# Patient Record
Sex: Female | Born: 1980 | Hispanic: Refuse to answer | Marital: Single | Smoking: Never smoker
Health system: Southern US, Community
[De-identification: ages and names within clinical notes are randomized; demographics above are authoritative.]

## PROBLEM LIST (undated history)

## (undated) DIAGNOSIS — Z8669 Personal history of other diseases of the nervous system and sense organs: Secondary | ICD-10-CM

## (undated) DIAGNOSIS — J45909 Unspecified asthma, uncomplicated: Secondary | ICD-10-CM

## (undated) DIAGNOSIS — D259 Leiomyoma of uterus, unspecified: Secondary | ICD-10-CM

## (undated) DIAGNOSIS — R112 Nausea with vomiting, unspecified: Secondary | ICD-10-CM

## (undated) DIAGNOSIS — R519 Headache, unspecified: Secondary | ICD-10-CM

## (undated) DIAGNOSIS — N840 Polyp of corpus uteri: Secondary | ICD-10-CM

## (undated) DIAGNOSIS — Z923 Personal history of irradiation: Secondary | ICD-10-CM

## (undated) DIAGNOSIS — D649 Anemia, unspecified: Secondary | ICD-10-CM

## (undated) DIAGNOSIS — Z803 Family history of malignant neoplasm of breast: Secondary | ICD-10-CM

## (undated) DIAGNOSIS — C801 Malignant (primary) neoplasm, unspecified: Secondary | ICD-10-CM

## (undated) DIAGNOSIS — D219 Benign neoplasm of connective and other soft tissue, unspecified: Secondary | ICD-10-CM

## (undated) DIAGNOSIS — J189 Pneumonia, unspecified organism: Secondary | ICD-10-CM

## (undated) DIAGNOSIS — Z9889 Other specified postprocedural states: Secondary | ICD-10-CM

## (undated) HISTORY — DX: Leiomyoma of uterus, unspecified: D25.9

## (undated) HISTORY — DX: Family history of malignant neoplasm of breast: Z80.3

## (undated) HISTORY — DX: Personal history of other diseases of the nervous system and sense organs: Z86.69

## (undated) HISTORY — DX: Unspecified asthma, uncomplicated: J45.909

---
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
## 2003-05-21 HISTORY — PX: UTERINE FIBROID SURGERY: SHX826

## 2015-03-24 DIAGNOSIS — E559 Vitamin D deficiency, unspecified: Secondary | ICD-10-CM | POA: Insufficient documentation

## 2015-07-19 DIAGNOSIS — J45909 Unspecified asthma, uncomplicated: Secondary | ICD-10-CM | POA: Insufficient documentation

## 2015-08-10 LAB — HM PAP SMEAR

## 2015-08-18 ENCOUNTER — Other Ambulatory Visit: Payer: Self-pay | Admitting: Medical

## 2015-08-18 ENCOUNTER — Ambulatory Visit
Admission: RE | Admit: 2015-08-18 | Discharge: 2015-08-18 | Disposition: A | Discharge status: Home / Self Care | Payer: BLUE CROSS/BLUE SHIELD | Source: Ambulatory Visit | Attending: Medical | Admitting: Medical

## 2015-08-18 DIAGNOSIS — R05 Cough: Secondary | ICD-10-CM

## 2015-08-18 DIAGNOSIS — R059 Cough, unspecified: Secondary | ICD-10-CM

## 2015-08-29 DIAGNOSIS — J40 Bronchitis, not specified as acute or chronic: Secondary | ICD-10-CM | POA: Insufficient documentation

## 2016-06-24 DIAGNOSIS — J329 Chronic sinusitis, unspecified: Secondary | ICD-10-CM | POA: Insufficient documentation

## 2016-06-24 DIAGNOSIS — H6980 Other specified disorders of Eustachian tube, unspecified ear: Secondary | ICD-10-CM | POA: Insufficient documentation

## 2016-09-02 ENCOUNTER — Ambulatory Visit (INDEPENDENT_AMBULATORY_CARE_PROVIDER_SITE_OTHER): Payer: BLUE CROSS/BLUE SHIELD | Admitting: Advanced Practice Midwife

## 2016-09-02 ENCOUNTER — Encounter: Payer: Self-pay | Admitting: Advanced Practice Midwife

## 2016-09-02 VITALS — BP 120/80 | HR 87 | Ht 69.0 in | Wt 158.0 lb

## 2016-09-02 DIAGNOSIS — Z01419 Encounter for gynecological examination (general) (routine) without abnormal findings: Secondary | ICD-10-CM

## 2016-09-02 DIAGNOSIS — Z30019 Encounter for initial prescription of contraceptives, unspecified: Secondary | ICD-10-CM | POA: Diagnosis not present

## 2016-09-02 DIAGNOSIS — Z124 Encounter for screening for malignant neoplasm of cervix: Secondary | ICD-10-CM | POA: Diagnosis not present

## 2016-09-02 MED ORDER — ETONOGESTREL-ETHINYL ESTRADIOL 0.12-0.015 MG/24HR VA RING: VAGINAL_RING | VAGINAL | 11 refills | Status: DC

## 2016-09-02 NOTE — Progress Notes (Signed)
Gynecology Annual Exam  PCP: No PCP Per Patient  Chief Complaint:  Chief Complaint  Patient presents with  . Gynecologic Exam    History of Present Illness: Patient is a 36 y.o. G0P0000 presents for annual exam. The patient has no complaints today.   LMP: Patient's last menstrual period was 08/22/2016. Average Interval: regular, 28 days Duration of flow: 5 days Heavy Menses: no Clots: no Intermenstrual Bleeding: no Postcoital Bleeding: no Dysmenorrhea: no   The patient is sexually active. She currently uses Nuvaring for contraception. She denies dyspareunia.  The patient does not perform self breast exams.  There is no notable family history of breast or ovarian cancer in her family.  The patient wears seatbelts: yes.   The patient has regular exercise: occasional walking. She regulary rides her bicycle when she is home in [place] for summers. She admits to generally eating a healthy diet.  The patient denies current symptoms of depression.    Review of Systems: Review of Systems  Constitutional: Negative.   HENT: Negative.   Eyes: Negative.   Respiratory: Negative.   Cardiovascular: Negative.   Gastrointestinal: Negative.   Genitourinary: Negative.   Musculoskeletal: Negative.   Skin: Negative.   Neurological: Negative.   Endo/Heme/Allergies: Negative.   Psychiatric/Behavioral: Negative.     Past Medical History:  Past Medical History:  Diagnosis Date  . Asthma   . Uterine myoma     Past Surgical History:  No past surgical history on file.  Gynecologic History:  Patient's last menstrual period was 08/22/2016. Contraception: NuvaRing vaginal inserts Last Pap: Results were: no abnormalities   Obstetric History: G0P0000  Family History:  Family History  Problem Relation Age of Onset  . Endometriosis Mother   . Hypertension Mother   . Skin cancer Father   . Hypertension Father   . Crohn's disease Father   . Breast cancer Paternal Grandmother      Social History:  Social History   Social History  . Marital status: Single    Spouse name: N/A  . Number of children: N/A  . Years of education: N/A   Occupational History  . Not on file.   Social History Main Topics  . Smoking status: Never Smoker  . Smokeless tobacco: Never Used  . Alcohol use No  . Drug use: No  . Sexual activity: No   Other Topics Concern  . Not on file   Social History Narrative  . No narrative on file    Allergies:  Allergies  Allergen Reactions  . Aspirin Shortness Of Breath  . Iodine Rash    Medications: Prior to Admission medications   Medication Sig Start Date End Date Taking? Authorizing Provider  albuterol (PROVENTIL HFA;VENTOLIN HFA) 108 (90 Base) MCG/ACT inhaler Inhale into the lungs.    Historical Provider, MD  ibuprofen (ADVIL,MOTRIN) 400 MG tablet Take by mouth.    Historical Provider, MD  loratadine (CLARITIN) 10 MG tablet Take by mouth.    Historical Provider, MD    Physical Exam Vitals: Blood pressure 120/80, pulse 87, height 5\' 9"  (1.753 m), weight 158 lb (71.7 kg), last menstrual period 08/22/2016.  General: NAD HEENT: normocephalic, anicteric Thyroid: no enlargement, no palpable nodules Pulmonary: No increased work of breathing, CTAB Cardiovascular: RRR, distal pulses 2+ Breast: Breast symmetrical, no tenderness, no palpable nodules or masses, no skin or nipple retraction present, no nipple discharge.  No axillary or supraclavicular lymphadenopathy. Abdomen: NABS, soft, non-tender, non-distended.  Umbilicus without lesions.  No  hepatomegaly, splenomegaly or masses palpable. No evidence of hernia  Genitourinary:  External: Normal external female genitalia.  Normal urethral meatus, normal  Bartholin's and Skene's glands.    Vagina: Normal vaginal mucosa, no evidence of prolapse.    Cervix: Grossly normal in appearance, no bleeding  Uterus: Non-enlarged, mobile, normal contour.  No CMT  Adnexa: ovaries non-enlarged, no  adnexal masses  Rectal: deferred  Lymphatic: no evidence of inguinal lymphadenopathy Extremities: no edema, erythema, or tenderness Neurologic: Grossly intact Psychiatric: mood appropriate, affect full  Assessment: 36 y.o. G0P0000 Well woman exam with PAP smear   Plan: Problem List Items Addressed This Visit    None      1) STI screening was done as part of PAP smear  2) ASCCP guidelines and rational discussed.  Patient opts for yearly screening interval  3) Contraception - Pt chooses to continue on Nuvaring for best control of her uterine fibroids. She has annual u/s f/u per her gyn in [place].  4) Recommend increase in healthy lifestyle exercise  5) Follow up 1 year for routine annual exam   Rod Can, CNM  Patient ID: Madison Sullivan, female   DOB: May 04, 1981, 36 y.o.   MRN: [id number]

## 2016-09-03 LAB — IGP,RFX APTIMA HPV ALL PTH: PAP SMEAR COMMENT: 0

## 2016-10-01 ENCOUNTER — Telehealth: Payer: Self-pay | Admitting: Advanced Practice Midwife

## 2016-10-01 NOTE — Telephone Encounter (Signed)
Patient called and stated that she went to her pharm and she usually get 3 months of nuvarings at the time, however they said at the pharm that Opal Sidles put in for 1 at the time.  She is traveling this summer and is in need of the 3 months at the time.  Please advise patient.   CB# [phone]

## 2016-10-01 NOTE — Telephone Encounter (Signed)
Caller states RX was changed and she wants the 3 month Pack called in. It isn't urgent. Nuvoring -in the past it has been a 3 pack. She will be traveling. She doesn't want 11 refills but would like it changed to a 3 pack so she doesn't have to get refills. Pharmacy (used to be [facility]

## 2016-10-01 NOTE — Telephone Encounter (Signed)
Contacted [facility] and updated rx to 3 months supply due to 1 year of refills. Left msg for pt that we have updated rx.

## 2016-10-02 NOTE — Telephone Encounter (Signed)
Left message for patient to call back if there are still problems with her Rx for Nuvaring

## 2017-01-21 ENCOUNTER — Encounter: Payer: Self-pay | Admitting: Medical

## 2017-01-21 ENCOUNTER — Ambulatory Visit: Payer: BLUE CROSS/BLUE SHIELD | Admitting: Medical

## 2017-01-21 VITALS — BP 110/80 | HR 87 | Temp 99.6°F | Resp 18 | Ht 69.0 in | Wt 151.0 lb

## 2017-01-21 DIAGNOSIS — M722 Plantar fascial fibromatosis: Secondary | ICD-10-CM

## 2017-01-21 DIAGNOSIS — Z0000 Encounter for general adult medical examination without abnormal findings: Secondary | ICD-10-CM

## 2017-01-21 DIAGNOSIS — K625 Hemorrhage of anus and rectum: Secondary | ICD-10-CM

## 2017-01-21 NOTE — Patient Instructions (Addendum)
Rectal Bleeding Rectal bleeding is when blood comes out of the opening of the butt (anus). People with this kind of bleeding may notice bright red blood in their underwear or in the toilet after they poop (have a bowel movement). They may also have dark red or black poop (stool). Rectal bleeding is often a sign that something is wrong. It needs to be checked by a doctor. Follow these instructions at home: Watch for any changes in your condition. Take these actions to help with bleeding and discomfort:  Eat a diet that is high in fiber. This will keep your poop soft so it is easier for you to poop without pushing too hard. Ask your doctor to tell you what foods and drinks are high in fiber.  Drink enough fluid to keep your pee (urine) clear or pale yellow. This also helps keep your poop soft.  Try taking a warm bath. This may help with pain.  Keep all follow-up visits as told by your doctor. This is important.  Get help right away if:  You have new bleeding.  You have more bleeding than before.  You have black or dark red poop.  You throw up (vomit) blood or something that looks like coffee grounds.  You have pain or tenderness in your belly (abdomen).  You have a fever.  You feel weak.  You feel sick to your stomach (nauseous).  You pass out (faint).  You have very bad pain in your butt.  You cannot poop. This information is not intended to replace advice given to you by your health care provider. Make sure you discuss any questions you have with your health care provider. Document Released: 01/16/2011 Document Revised: 10/12/2015 Document Reviewed: 07/02/2015 Elsevier Interactive Patient Education  [employer].        2007, [employer] Doc.30Plantar Fasciitis Plantar fasciitis is a painful foot condition that affects the heel. It occurs when the band of tissue that connects the toes to the heel bone (plantar fascia) becomes irritated. This can  happen after exercising too much or doing other repetitive activities (overuse injury). The pain from plantar fasciitis can range from mild irritation to severe pain that makes it difficult for you to walk or move. The pain is usually worse in the morning or after you have been sitting or lying down for a while. What are the causes? This condition may be caused by:  Standing for long periods of time.  Wearing shoes that do not fit.  Doing high-impact activities, including running, aerobics, and ballet.  Being overweight.  Having an abnormal way of walking (gait).  Having tight calf muscles.  Having high arches in your feet.  Starting a new athletic activity.  What are the signs or symptoms? The main symptom of this condition is heel pain. Other symptoms include:  Pain that gets worse after activity or exercise.  Pain that is worse in the morning or after resting.  Pain that goes away after you walk for a few minutes.  How is this diagnosed? This condition may be diagnosed based on your signs and symptoms. Your health care provider will also do a physical exam to check for:  A tender area on the bottom of your foot.  A high arch in your foot.  Pain when you move your foot.  Difficulty moving your foot.  You may also need to have imaging studies to confirm the diagnosis. These can include:  X-rays.  Ultrasound.  MRI.  How  is this treated? Treatment for plantar fasciitis depends on the severity of the condition. Your treatment may include:  Rest, ice, and over-the-counter pain medicines to manage your pain.  Exercises to stretch your calves and your plantar fascia.  A splint that holds your foot in a stretched, upward position while you sleep (night splint).  Physical therapy to relieve symptoms and prevent problems in the future.  Cortisone injections to relieve severe pain.  Extracorporeal shock wave therapy (ESWT) to stimulate damaged plantar fascia with  electrical impulses. It is often used as a last resort before surgery.  Surgery, if other treatments have not worked after 12 months.  Follow these instructions at home:  Take medicines only as directed by your health care provider.  Avoid activities that cause pain.  Roll the bottom of your foot over a bag of ice or a bottle of cold water. Do this for 20 minutes, 3-4 times a day.  Perform simple stretches as directed by your health care provider.  Try wearing athletic shoes with air-sole or gel-sole cushions or soft shoe inserts.  Wear a night splint while sleeping, if directed by your health care provider.  Keep all follow-up appointments with your health care provider. How is this prevented?  Do not perform exercises or activities that cause heel pain.  Consider finding low-impact activities if you continue to have problems.  Lose weight if you need to. The best way to prevent plantar fasciitis is to avoid the activities that aggravate your plantar fascia. Contact a health care provider if:  Your symptoms do not go away after treatment with home care measures.  Your pain gets worse.  Your pain affects your ability to move or do your daily activities. This information is not intended to replace advice given to you by your health care provider. Make sure you discuss any questions you have with your health care provider. Document Released: 01/29/2001 Document Revised: 10/09/2015 Document Reviewed: 03/16/2014 Elsevier Interactive Patient Education  [employer].

## 2017-01-21 NOTE — Progress Notes (Signed)
   Subjective:    Patient ID: Madison Sullivan, female    DOB: 1980/05/26, 36 y.o.   MRN: [id number]  HPI 36 yo comes in today. Left foot pain at arch of foot, starting more in the spring, Notices it sometimes in the morning and sometimes after resting. No numbness or tingling. Has iced it and used supportive socks , and arch supportive shoes which helped. Seem to get worse over the summer after walking a lot in [place]. Rates pain today as 1-2/10. If she walks all day 8/10. Located at the arch of foot.  Thinks she may have hemmorrhoids after hard stools, some soreness after hard stools. Starting in the spring. After returning from [place] decided to get it checked out.  Constipation on occasion( maybe once a week).Has had some bleeding after hard stool. Bright red. Just noticed on wiping, none in the toilet. None Blood in stool.   Father with Crohns disease. Avoids chili spice/ spicy foods/ and garlic( father has allergy to garlic past history of bowel surgery). Last episode was last week.  Would like to  Set up for primary care appointment. Last year had the pneumonia 36 and flu vaccine wondering about next pneumonia vaccine.   Review of Systems  Constitutional: Negative for chills and fever.  HENT: Positive for congestion and sinus pressure. Negative for ear pain, rhinorrhea and sore throat.   Eyes: Positive for itching.  Respiratory: Negative for cough and shortness of breath.   Cardiovascular: Negative for chest pain.  Gastrointestinal: Positive for anal bleeding, constipation and rectal pain. Negative for abdominal pain, blood in stool, diarrhea, nausea and vomiting.  Endocrine: Negative for polydipsia, polyphagia and polyuria.  Genitourinary: Negative for dysuria.  Musculoskeletal: Positive for neck pain. Negative for back pain.  Skin: Negative for rash.  Allergic/Immunologic: Positive for environmental allergies and food allergies.  Neurological: Positive for headaches. Negative for  dizziness, syncope and light-headedness.  Hematological: Negative for adenopathy.  Psychiatric/Behavioral: Negative for agitation, behavioral problems and suicidal ideas. The patient is not nervous/anxious.      neck pain with tension.  Possibly food allergies see note above. Objective:   Physical Exam  Constitutional: She is oriented to person, place, and time. She appears well-developed and well-nourished.  HENT:  Head: Normocephalic and atraumatic.  Eyes: Pupils are equal, round, and reactive to light. Conjunctivae and EOM are normal.  Cardiovascular: Normal rate, regular rhythm and normal heart sounds.   Pulmonary/Chest: Effort normal and breath sounds normal.  Musculoskeletal: Normal range of motion.  Neurological: She is alert and oriented to person, place, and time.  Skin: Skin is warm and dry.  Psychiatric: She has a normal mood and affect. Her behavior is normal. Judgment and thought content normal.  Nursing note and vitals reviewed.     guiac negative. No hemorrhoids on exam external, none palpated internally. nontender on rectal exam.    Assessment & Plan:  Plantar fascitis left foot. .ice , supportive shoes.will hold off on Ibuprofen due to rectal bleeding. If worsening will refer to podiatry. Constipation , wiping with blood present, rectal bleeding. Will refer to for evaluation GI, ? If she had a small fissure I see nothing on exam today. Ordered blood work for primary care exam. CBC/ Met C Thyroid panel, lipid panel ,Vit D. Return to the clinic for primary exam after blood work is complete.

## 2017-01-29 DIAGNOSIS — E559 Vitamin D deficiency, unspecified: Secondary | ICD-10-CM

## 2017-01-29 DIAGNOSIS — H6980 Other specified disorders of Eustachian tube, unspecified ear: Secondary | ICD-10-CM

## 2017-01-29 DIAGNOSIS — J329 Chronic sinusitis, unspecified: Secondary | ICD-10-CM

## 2017-01-29 DIAGNOSIS — J45909 Unspecified asthma, uncomplicated: Secondary | ICD-10-CM

## 2017-01-29 DIAGNOSIS — J40 Bronchitis, not specified as acute or chronic: Secondary | ICD-10-CM

## 2017-02-04 ENCOUNTER — Other Ambulatory Visit: Payer: BLUE CROSS/BLUE SHIELD

## 2017-02-04 DIAGNOSIS — Z0000 Encounter for general adult medical examination without abnormal findings: Secondary | ICD-10-CM

## 2017-02-04 LAB — POCT URINALYSIS DIPSTICK
BILIRUBIN UA: NEGATIVE
Blood, UA: NEGATIVE
GLUCOSE UA: NEGATIVE
KETONES UA: NEGATIVE
LEUKOCYTES UA: NEGATIVE
Nitrite, UA: NEGATIVE
PH UA: 6.5 (ref 5.0–8.0)
Spec Grav, UA: 1.015 (ref 1.010–1.025)
Urobilinogen, UA: 0.2 E.U./dL

## 2017-02-05 LAB — CMP12+LP+TP+TSH+6AC+CBC/D/PLT
ALBUMIN: 4.6 g/dL (ref 3.5–5.5)
ALK PHOS: 85 IU/L (ref 39–117)
ALT: 15 IU/L (ref 0–32)
AST: 16 IU/L (ref 0–40)
Albumin/Globulin Ratio: 2.2 (ref 1.2–2.2)
BASOS: 1 %
BUN/Creatinine Ratio: 8 — ABNORMAL LOW (ref 9–23)
BUN: 7 mg/dL (ref 6–20)
Basophils Absolute: 0 10*3/uL (ref 0.0–0.2)
Bilirubin Total: 0.5 mg/dL (ref 0.0–1.2)
CALCIUM: 8.9 mg/dL (ref 8.7–10.2)
CHLORIDE: 105 mmol/L (ref 96–106)
CHOL/HDL RATIO: 2.8 ratio (ref 0.0–4.4)
CHOLESTEROL TOTAL: 193 mg/dL (ref 100–199)
Creatinine, Ser: 0.93 mg/dL (ref 0.57–1.00)
EOS (ABSOLUTE): 0.1 10*3/uL (ref 0.0–0.4)
Eos: 1 %
Free Thyroxine Index: 1.6 (ref 1.2–4.9)
GFR calc Af Amer: 91 mL/min/{1.73_m2} (ref >59)
GFR, EST NON AFRICAN AMERICAN: 79 mL/min/{1.73_m2} (ref >59)
GGT: 12 IU/L (ref 0–60)
GLOBULIN, TOTAL: 2.1 g/dL (ref 1.5–4.5)
Glucose: 90 mg/dL (ref 65–99)
HDL: 68 mg/dL (ref >39)
HEMATOCRIT: 43.7 % (ref 34.0–46.6)
Hemoglobin: 14.8 g/dL (ref 11.1–15.9)
Immature Grans (Abs): 0 10*3/uL (ref 0.0–0.1)
Immature Granulocytes: 0 %
Iron: 149 ug/dL (ref 27–159)
LDH: 154 IU/L (ref 119–226)
LDL CALC: 93 mg/dL (ref 0–99)
LYMPHS ABS: 2.9 10*3/uL (ref 0.7–3.1)
Lymphs: 45 %
MCH: 30.6 pg (ref 26.6–33.0)
MCHC: 33.9 g/dL (ref 31.5–35.7)
MCV: 91 fL (ref 79–97)
MONOS ABS: 0.5 10*3/uL (ref 0.1–0.9)
Monocytes: 9 %
NEUTROS ABS: 2.8 10*3/uL (ref 1.4–7.0)
Neutrophils: 44 %
PHOSPHORUS: 2.9 mg/dL (ref 2.5–4.5)
Platelets: 268 10*3/uL (ref 150–379)
Potassium: 4.3 mmol/L (ref 3.5–5.2)
RBC: 4.83 x10E6/uL (ref 3.77–5.28)
RDW: 13.1 % (ref 12.3–15.4)
SODIUM: 142 mmol/L (ref 134–144)
T3 Uptake Ratio: 19 % — ABNORMAL LOW (ref 24–39)
T4 TOTAL: 8.5 ug/dL (ref 4.5–12.0)
TSH: 2.5 u[IU]/mL (ref 0.450–4.500)
Total Protein: 6.7 g/dL (ref 6.0–8.5)
Triglycerides: 161 mg/dL — ABNORMAL HIGH (ref 0–149)
Uric Acid: 4.2 mg/dL (ref 2.5–7.1)
VLDL Cholesterol Cal: 32 mg/dL (ref 5–40)
WBC: 6.3 10*3/uL (ref 3.4–10.8)

## 2017-02-05 LAB — VITAMIN D 25 HYDROXY (VIT D DEFICIENCY, FRACTURES): Vit D, 25-Hydroxy: 27.7 ng/mL — ABNORMAL LOW (ref 30.0–100.0)

## 2017-02-18 ENCOUNTER — Encounter: Payer: Self-pay | Admitting: Medical

## 2017-02-18 ENCOUNTER — Ambulatory Visit: Payer: BLUE CROSS/BLUE SHIELD | Admitting: Medical

## 2017-02-18 VITALS — BP 116/80 | HR 94 | Temp 99.2°F | Resp 16 | Wt 149.0 lb

## 2017-02-18 DIAGNOSIS — J302 Other seasonal allergic rhinitis: Secondary | ICD-10-CM

## 2017-02-18 DIAGNOSIS — E559 Vitamin D deficiency, unspecified: Secondary | ICD-10-CM

## 2017-02-18 DIAGNOSIS — J4520 Mild intermittent asthma, uncomplicated: Secondary | ICD-10-CM

## 2017-02-18 DIAGNOSIS — Z23 Encounter for immunization: Secondary | ICD-10-CM

## 2017-02-18 DIAGNOSIS — Z0000 Encounter for general adult medical examination without abnormal findings: Secondary | ICD-10-CM

## 2017-02-18 NOTE — Patient Instructions (Addendum)
Take Vitamin D3 2000IU/ day for  3 months then have a recheck. Calll when desiring a referral to podiatry.    Breast Self-Awareness Breast self-awareness means:  Knowing how your breasts look.  Knowing how your breasts feel.  Checking your breasts every month for changes.  Telling your doctor if you notice a change in your breasts.  Breast self-awareness allows you to notice a breast problem early while it is still small. How to do a breast self-exam One way to learn what is normal for your breasts and to check for changes is to do a breast self-exam. To do a breast self-exam: Look for Changes  1. Take off all the clothes above your waist. 2. Stand in front of a mirror in a room with good lighting. 3. Put your hands on your hips. 4. Push your hands down. 5. Look at your breasts and nipples in the mirror to see if one breast or nipple looks different than the other. Check to see if: ? The shape of one breast is different. ? The size of one breast is different. ? There are wrinkles, dips, and bumps in one breast and not the other. 6. Look at each breast for changes in your skin, such as: ? Redness. ? Scaly areas. 7. Look for changes in your nipples, such as: ? Liquid around the nipples. ? Bleeding. ? Dimpling. ? Redness. ? A change in where the nipples are. Feel for Changes 1. Lie on your back on the floor. 2. Feel each breast. To do this, follow these steps: ? Pick a breast to feel. ? Put the arm closest to that breast above your head. ? Use your other arm to feel the nipple area of your breast. Feel the area with the pads of your three middle fingers by making small circles with your fingers. For the first circle, press lightly. For the second circle, press harder. For the third circle, press even harder. ? Keep making circles with your fingers at the light, harder, and even harder pressures as you move down your breast. Stop when you feel your ribs. ? Move your fingers a  little toward the center of your body. ? Start making circles with your fingers again, this time going up until you reach your collarbone. ? Keep making up and down circles until you reach your armpit. Remember to keep using the three pressures. ? Feel the other breast in the same way. 3. Sit or stand in the shower or tub. 4. With soapy water on your skin, feel each breast the same way you did in step 2, when you were lying on the floor. Write Down What You Find  After doing the self-exam, write down:  What is normal for each breast.  Any changes you find in each breast.  When you last had your period.  How often should I check my breasts? Check your breasts every month. If you are breastfeeding, the best time to check them is after you feed your baby or after you use a breast pump. If you get periods, the best time to check your breasts is 5-7 days after your period is over. When should I see my doctor? See your doctor if you notice:  A change in shape or size of your breasts or nipples.  A change in the skin of your breast or nipples, such as red or scaly skin.  Unusual fluid coming from your nipples.  A lump or thick area that was not  there before.  Pain in your breasts.  Anything that concerns you.  This information is not intended to replace advice given to you by your health care provider. Make sure you discuss any questions you have with your health care provider. Document Released: 10/23/2007 Document Revised: 10/12/2015 Document Reviewed: 03/26/2015 Elsevier Interactive Patient Education  2018 [employer].  Vitamin D Deficiency Vitamin D deficiency is when your body does not have enough vitamin D. Vitamin D is important because:  It helps your body use other minerals that your body needs.  It helps keep your bones strong and healthy.  It may help to prevent some diseases.  It helps your heart and other muscles work well.  You can get vitamin D by:  Eating  foods with vitamin D in them.  Drinking or eating milk or other foods that have had vitamin D added to them.  Taking a vitamin D supplement.  Being in the sun.  Not getting enough vitamin D can make your bones become soft. It can also cause other health problems. Follow these instructions at home:  Take medicines and supplements only as told by your doctor.  Eat foods that have vitamin D. These include: ? Dairy products, cereals, or juices with added vitamin D. Check the label for vitamin D. ? Fatty fish like salmon or trout. ? Eggs. ? Oysters.  Do not use tanning beds.  Stay at a healthy weight. Lose weight, if needed.  Keep all follow-up visits as told by your doctor. This is important. Contact a doctor if:  Your symptoms do not go away.  You feel sick to your stomach (nauseous).  Youthrow up (vomit).  You poop less often than usual or you have trouble pooping (constipation). This information is not intended to replace advice given to you by your health care provider. Make sure you discuss any questions you have with your health care provider. Document Released: 04/25/2011 Document Revised: 10/12/2015 Document Reviewed: 09/21/2014 Elsevier Interactive Patient Education  2018 [employer]-healthy meal planning includes:  Limiting unhealthy fats.  Increasing healthy fats.  Making other small dietary changes.  You may need to talk with your doctor or a diet specialist (dietitian) to create an eating plan that is right for you. What types of fat should I choose?  Choose healthy fats. These include olive oil and canola oil, flaxseeds, walnuts, almonds, and seeds.  Eat more omega-3 fats. These include salmon, mackerel, sardines, tuna, flaxseed oil, and ground flaxseeds. Try to eat fish at least twice each week.  Limit saturated fats. ? Saturated fats are often found in animal products, such as meats, butter, and cream. ? Plant  sources of saturated fats include palm oil, palm kernel oil, and coconut oil.  Avoid foods with partially hydrogenated oils in them. These include stick margarine, some tub margarines, cookies, crackers, and other baked goods. These contain trans fats. What general guidelines do I need to follow?  Check food labels carefully. Identify foods with trans fats or high amounts of saturated fat.  Fill one half of your plate with vegetables and green salads. Eat 4-5 servings of vegetables per day. A serving of vegetables is: ? 1 cup of raw leafy vegetables. ?  cup of raw or cooked cut-up vegetables. ?  cup of vegetable juice.  Fill one fourth of your plate with whole grains. Look for the word "whole" as the first word in the ingredient list.  Fill one fourth of your plate  with lean protein foods.  Eat 4-5 servings of fruit per day. A serving of fruit is: ? One medium whole fruit. ?  cup of dried fruit. ?  cup of fresh, frozen, or canned fruit. ?  cup of 100% fruit juice.  Eat more foods that contain soluble fiber. These include apples, broccoli, carrots, beans, peas, and barley. Try to get 20-30 g of fiber per day.  Eat more home-cooked food. Eat less restaurant, buffet, and fast food.  Limit or avoid alcohol.  Limit foods high in starch and sugar.  Avoid fried foods.  Avoid frying your food. Try baking, boiling, grilling, or broiling it instead. You can also reduce fat by: ? Removing the skin from poultry. ? Removing all visible fats from meats. ? Skimming the fat off of stews, soups, and gravies before serving them. ? Steaming vegetables in water or broth.  Lose weight if you are overweight.  Eat 4-5 servings of nuts, legumes, and seeds per week: ? One serving of dried beans or legumes equals  cup after being cooked. ? One serving of nuts equals 1 ounces. ? One serving of seeds equals  ounce or one tablespoon.  You may need to keep track of how much salt or sodium you  eat. This is especially true if you have high blood pressure. Talk with your doctor or dietitian to get more information. What foods can I eat? Grains Breads, including [place], white, pita, wheat, raisin, rye, oatmeal, and [place]. Tortillas that are neither fried nor made with lard or trans fat. Low-fat rolls, including hotdog and hamburger buns and English muffins. Biscuits. Muffins. Waffles. Pancakes. Light popcorn. Whole-grain cereals. Flatbread. Melba toast. Pretzels. Breadsticks. Rusks. Low-fat snacks. Low-fat crackers, including oyster, saltine, matzo, graham, animal, and rye. Rice and pasta, including brown rice and pastas that are made with whole wheat. Vegetables All vegetables. Fruits All fruits, but limit coconut. Meats and Other Protein Sources Lean, well-trimmed beef, veal, pork, and lamb. Chicken and [place] without skin. All fish and shellfish. Wild duck, rabbit, pheasant, and venison. Egg whites or low-cholesterol egg substitutes. Dried beans, peas, lentils, and tofu. Seeds and most nuts. Dairy Low-fat or nonfat cheeses, including ricotta, string, and mozzarella. Skim or 1% milk that is liquid, powdered, or evaporated. Buttermilk that is made with low-fat milk. Nonfat or low-fat yogurt. Beverages Mineral water. Diet carbonated beverages. Sweets and Desserts Sherbets and fruit ices. Honey, jam, marmalade, jelly, and syrups. Meringues and gelatins. Pure sugar candy, such as hard candy, jelly beans, gumdrops, mints, marshmallows, and small amounts of dark chocolate. [employer] Eat all sweets and desserts in moderation. Fats and Oils Nonhydrogenated (trans-free) margarines. Vegetable oils, including soybean, sesame, sunflower, olive, peanut, safflower, corn, canola, and cottonseed. Salad dressings or mayonnaise made with a vegetable oil. Limit added fats and oils that you use for cooking, baking, salads, and as spreads. Other Cocoa powder. Coffee and tea. All seasonings and  condiments. The items listed above may not be a complete list of recommended foods or beverages. Contact your dietitian for more options. What foods are not recommended? Grains Breads that are made with saturated or trans fats, oils, or whole milk. Croissants. Butter rolls. Cheese breads. Sweet rolls. Donuts. Buttered popcorn. Chow mein noodles. High-fat crackers, such as cheese or butter crackers. Meats and Other Protein Sources Fatty meats, such as hotdogs, short ribs, sausage, spareribs, bacon, rib eye roast or steak, and mutton. High-fat deli meats, such as salami and bologna. Caviar. Domestic duck and goose.  Organ meats, such as kidney, liver, sweetbreads, and heart. Dairy Cream, sour cream, cream cheese, and creamed cottage cheese. Whole-milk cheeses, including blue (bleu), Monte[place] Jack, [place], [place], [place], [place], Swiss, cheddar, [place], and [place]. Whole or 2% milk that is liquid, evaporated, or condensed. Whole buttermilk. Cream sauce or high-fat cheese sauce. Yogurt that is made from whole milk. Beverages Regular sodas and juice drinks with added sugar. Sweets and Desserts Frosting. Pudding. Cookies. Cakes other than angel food cake. Candy that has milk chocolate or white chocolate, hydrogenated fat, butter, coconut, or unknown ingredients. Buttered syrups. Full-fat ice cream or ice cream drinks. Fats and Oils Gravy that has suet, meat fat, or shortening. Cocoa butter, hydrogenated oils, palm oil, coconut oil, palm kernel oil. These can often be found in baked products, candy, fried foods, nondairy creamers, and whipped toppings. Solid fats and shortenings, including bacon fat, salt pork, lard, and butter. Nondairy cream substitutes, such as coffee creamers and sour cream substitutes. Salad dressings that are made of unknown oils, cheese, or sour cream. The items listed above may not be a complete list of foods and beverages to avoid. Contact your dietitian for more  information. This information is not intended to replace advice given to you by your health care provider. Make sure you discuss any questions you have with your health care provider. Document Released: 11/05/2011 Document Revised: 10/12/2015 Document Reviewed: 10/28/2013 Elsevier Interactive Patient Education  [employer].

## 2017-02-18 NOTE — Progress Notes (Signed)
Subjective:    Patient ID: Madison Sullivan, female    DOB: 09-23-1980, 36 y.o.   MRN: [id number]  HPI Here for primary care exam,  Works for [employer] 3  Years , teaches history. Single lives alone,  Seeing GI next week, Bleeding still coming and going. Occurs at the end of a bowel movement. No cramping. ( feels when the stool is harder, but denies constipation) Feels like the anus expands and blood appears on the toilet paper. No blood in the toilet or miixed in stool though may be at the end of the stool.  History of fractured patella tx with stablilization  1990.  Does not exercise regularly, Eats what she likes.   [place]  and get headaches, stomach nausea, got out of heat and the heigtht and felt better. Took Ibuprofen , took a nap and fell better.  itchy ears with nasal congestion. Review of Systems  Constitutional: Negative.   HENT: Positive for nosebleeds (with dry air and congestion).   Eyes: Positive for itching (claritin helps).  Respiratory: Negative.   Cardiovascular: Negative.   Gastrointestinal: Positive for anal bleeding.  Endocrine: Negative.   Genitourinary: Negative.   Musculoskeletal: Positive for gait problem (left foot pain,  waiting for GI eval before trying NSIDS , wears supportive shoes with good result.) and neck stiffness (upper back pain. typing and carrying backpack.).  Skin: Negative.   Allergic/Immunologic: Positive for environmental allergies.  Neurological: Positive for headaches (Headache at times, potential migraines.).  Hematological: Bruises/bleeds easily.  Psychiatric/Behavioral: Negative.  Negative for behavioral problems.       Objective:   Physical Exam  Constitutional: She is oriented to person, place, and time. She appears well-developed and well-nourished.  HENT:  Head: Normocephalic and atraumatic.  Right Ear: Hearing, tympanic membrane, external ear and ear canal normal.  Left Ear: Hearing,  tympanic membrane, external ear and ear canal normal.  Nose: Nose normal.  Mouth/Throat: Uvula is midline, oropharynx is clear and moist and mucous membranes are normal.  Eyes: Pupils are equal, round, and reactive to light. Conjunctivae and EOM are normal. Lids are everted and swept, no foreign bodies found.  Fundoscopic exam:      The right eye shows red reflex.       The left eye shows red reflex.  Neck: Trachea normal and normal range of motion. Neck supple. No thyromegaly present.  Cardiovascular: Normal rate, regular rhythm, normal heart sounds and intact distal pulses.  Exam reveals no gallop and no friction rub.   No murmur heard. Pulmonary/Chest: Effort normal and breath sounds normal.  Abdominal: Soft. Normal appearance, normal aorta and bowel sounds are normal.  Musculoskeletal: Normal range of motion.  Lymphadenopathy:    She has no cervical adenopathy.  Neurological: She is alert and oriented to person, place, and time. She has normal strength. No cranial nerve deficit or sensory deficit. She displays a negative Romberg sign. GCS eye subscore is 4. GCS verbal subscore is 5. GCS motor subscore is 6.  Reflex Scores:      Brachioradialis reflexes are 2+ on the right side and 2+ on the left side.      Patellar reflexes are 2+ on the right side and 2+ on the left side.      Achilles reflexes are 2+ on the right side and 2+ on the left side. Skin:  Mid upper back flaking, uses a shampoo x 3 days from [place] and a gel,  clears it up for  6 months then she needs to retreat. Not sure of the name in [place]. Looks fungal , scaling mid upper back.  Psychiatric: She has a normal mood and affect. Her behavior is normal. Judgment and thought content normal.  Nursing note and vitals reviewed.     Breast exam by OB/GYN last visit in April 2018. Rectal exam done on  01/21/17. Hemocult negative.      Assessment & Plan:   Primary care exam Reviewed labs, Vitamin D deficiency to take   Vitamin D3 2000 IU/day and recheck in  3 months.   Asthma, and seasonal allergies well controlled with current medications.  Flu Vaccine given today. Reviewed with patent she does not need Pneumonia  23 at this time.She has gotten the Pneumonia 13 vaccine ( documented in [place]). Reviewed exercise and diet with patient in getting triglycerides lower.  Healthy diet information and self breast exams educational material given to patient. Return to the clinic as needed. She verbalizes understanding and has no questions at this time.

## 2017-04-01 ENCOUNTER — Ambulatory Visit: Payer: BLUE CROSS/BLUE SHIELD | Admitting: Medical

## 2017-04-01 ENCOUNTER — Encounter: Payer: Self-pay | Admitting: Medical

## 2017-04-01 VITALS — BP 126/86 | HR 79 | Temp 99.1°F | Resp 16 | Wt 156.0 lb

## 2017-04-01 DIAGNOSIS — J069 Acute upper respiratory infection, unspecified: Secondary | ICD-10-CM

## 2017-04-01 DIAGNOSIS — J0100 Acute maxillary sinusitis, unspecified: Secondary | ICD-10-CM

## 2017-04-01 MED ORDER — AMOXICILLIN-POT CLAVULANATE 875-125 MG PO TABS: 1.0000 | ORAL_TABLET | Freq: Two times a day (BID) | ORAL | 0 refills | Status: DC

## 2017-04-01 MED ORDER — BENZONATATE 100 MG PO CAPS: 100.0000 mg | ORAL_CAPSULE | Freq: Three times a day (TID) | ORAL | 0 refills | Status: DC

## 2017-04-01 NOTE — Progress Notes (Signed)
   Subjective:    Patient ID: Madison Sullivan, female    DOB: 21-Jul-1980, 36 y.o.   MRN: [id number]  HPI 36 yo female in non acute distress complaints of nasal congestion, ears full , cough productive green and headache.Talking brings on coughing. Possibly with fever and chills last night. Feeling fatigued from all the coughing. Feeling short of breath with coughing. Continues to work. Brought her Albuterol inhaler to work but has not used it yet.   Review of Systems  Constitutional: Positive for fatigue. Negative for chills (possibly) and fever (possibly).  HENT: Positive for congestion, postnasal drip, sinus pressure, sinus pain and sore throat. Negative for ear pain, hearing loss and trouble swallowing.   Eyes: Positive for itching. Negative for discharge.  Respiratory: Positive for cough, chest tightness, shortness of breath and wheezing.   Cardiovascular: Negative for chest pain, palpitations and leg swelling.  Gastrointestinal: Negative for abdominal pain.  Endocrine: Negative for cold intolerance and heat intolerance.  Genitourinary: Negative for dysuria.  Musculoskeletal: Positive for myalgias.  Skin: Negative for rash.  Allergic/Immunologic: Positive for environmental allergies. Negative for food allergies.  Neurological: Positive for headaches. Negative for dizziness, syncope and light-headedness.  Hematological: Negative for adenopathy.  Psychiatric/Behavioral: Negative for behavioral problems, self-injury and suicidal ideas. The patient is not nervous/anxious.        Objective:   Physical Exam  Constitutional: She is oriented to person, place, and time. She appears well-developed and well-nourished.  HENT:  Head: Normocephalic and atraumatic.  Right Ear: Hearing, external ear and ear canal normal. A middle ear effusion is present.  Left Ear: Hearing, external ear and ear canal normal. A middle ear effusion is present.  Nose: Mucosal edema and rhinorrhea present.   Mouth/Throat: Uvula is midline, oropharynx is clear and moist and mucous membranes are normal.  Eyes: Conjunctivae and EOM are normal. Pupils are equal, round, and reactive to light.  Neck: Normal range of motion. Neck supple.  Cardiovascular: Normal rate, regular rhythm, normal heart sounds and intact distal pulses. Exam reveals no gallop and no friction rub.  No murmur heard. Pulmonary/Chest: Effort normal and breath sounds normal. No respiratory distress. She has no wheezes. She has no rales.  Lymphadenopathy:    She has cervical adenopathy.  Neurological: She is alert and oriented to person, place, and time.  Skin: Skin is warm and dry.  Psychiatric: She has a normal mood and affect. Her behavior is normal. Judgment and thought content normal.  Nursing note and vitals reviewed.     Cough noted in room. Cough while trying to talk in long sentences.     Assessment & Plan:  Sinusitis/ Upper Respiratory Infection/ Cough Meds ordered this encounter  Medications  . amoxicillin-clavulanate (AUGMENTIN) 875-125 MG tablet    Sig: Take 1 tablet 2 (two) times daily by mouth.    Dispense:  20 tablet    Refill:  0  . benzonatate (TESSALON) 100 MG capsule    Sig: Take 1 capsule (100 mg total) 3 (three) times daily by mouth. As needed for cough    Dispense:  30 capsule    Refill:  0  Return in 3-5 days if not improving. Rest and increase fluids. Recommmended OTC Claritin to help dry up post nasal drip and Mucinex ( plain) to help loosen up mucus in lungs. Use Albuterol MDI as directed.  Rest and increase fluids. Patient verbalizes understanding and has no questions at discharge.

## 2017-04-01 NOTE — Patient Instructions (Signed)
Cough, Adult A cough helps to clear your throat and lungs. A cough may last only 2-3 weeks (acute), or it may last longer than 8 weeks (chronic). Many different things can cause a cough. A cough may be a sign of an illness or another medical condition. Follow these instructions at home:  Pay attention to any changes in your cough.  Take medicines only as told by your doctor. ? If you were prescribed an antibiotic medicine, take it as told by your doctor. Do not stop taking it even if you start to feel better. ? Talk with your doctor before you try using a cough medicine.  Drink enough fluid to keep your pee (urine) clear or pale yellow.  If the air is dry, use a cold steam vaporizer or humidifier in your home.  Stay away from things that make you cough at work or at home.  If your cough is worse at night, try using extra pillows to raise your head up higher while you sleep.  Do not smoke, and try not to be around smoke. If you need help quitting, ask your doctor.  Do not have caffeine.  Do not drink alcohol.  Rest as needed. Contact a doctor if:  You have new problems (symptoms).  You cough up yellow fluid (pus).  Your cough does not get better after 2-3 weeks, or your cough gets worse.  Medicine does not help your cough and you are not sleeping well.  You have pain that gets worse or pain that is not helped with medicine.  You have a fever.  You are losing weight and you do not know why.  You have night sweats. Get help right away if:  You cough up blood.  You have trouble breathing.  Your heartbeat is very fast. This information is not intended to replace advice given to you by your health care provider. Make sure you discuss any questions you have with your health care provider. Document Released: 01/17/2011 Document Revised: 10/12/2015 Document Reviewed: 07/13/2014 Elsevier Interactive Patient Education  2018 [employer] Upper Respiratory Infection,  Adult Most upper respiratory infections (URIs) are caused by a virus. A URI affects the nose, throat, and upper air passages. The most common type of URI is often called "the common cold." Follow these instructions at home:  Take medicines only as told by your doctor.  Gargle warm saltwater or take cough drops to comfort your throat as told by your doctor.  Use a warm mist humidifier or inhale steam from a shower to increase air moisture. This may make it easier to breathe.  Drink enough fluid to keep your pee (urine) clear or pale yellow.  Eat soups and other clear broths.  Have a healthy diet.  Rest as needed.  Go back to work when your fever is gone or your doctor says it is okay. ? You may need to stay home longer to avoid giving your URI to others. ? You can also wear a face mask and wash your hands often to prevent spread of the virus.  Use your inhaler more if you have asthma.  Do not use any tobacco products, including cigarettes, chewing tobacco, or electronic cigarettes. If you need help quitting, ask your doctor. Contact a doctor if:  You are getting worse, not better.  Your symptoms are not helped by medicine.  You have chills.  You are getting more short of breath.  You have brown or red mucus.  You have yellow or   brown discharge from your nose.  You have pain in your face, especially when you bend forward.  You have a fever.  You have puffy (swollen) neck glands.  You have pain while swallowing.  You have white areas in the back of your throat. Get help right away if:  You have very bad or constant: ? Headache. ? Ear pain. ? Pain in your forehead, behind your eyes, and over your cheekbones (sinus pain). ? Chest pain.  You have long-lasting (chronic) lung disease and any of the following: ? Wheezing. ? Long-lasting cough. ? Coughing up blood. ? A change in your usual mucus.  You have a stiff neck.  You have changes in  your: ? Vision. ? Hearing. ? Thinking. ? Mood. This information is not intended to replace advice given to you by your health care provider. Make sure you discuss any questions you have with your health care provider. Document Released: 10/23/2007 Document Revised: 01/07/2016 Document Reviewed: 08/11/2013 Elsevier Interactive Patient Education  2018 [employer]. Sinusitis, Adult Sinusitis is soreness and inflammation of your sinuses. Sinuses are hollow spaces in the bones around your face. They are located:  Around your eyes.  In the middle of your forehead.  Behind your nose.  In your cheekbones.  Your sinuses and nasal passages are lined with a stringy fluid (mucus). Mucus normally drains out of your sinuses. When your nasal tissues get inflamed or swollen, the mucus can get trapped or blocked so air cannot flow through your sinuses. This lets bacteria, viruses, and funguses grow, and that leads to infection. Follow these instructions at home: Medicines  Take, use, or apply over-the-counter and prescription medicines only as told by your doctor. These may include nasal sprays.  If you were prescribed an antibiotic medicine, take it as told by your doctor. Do not stop taking the antibiotic even if you start to feel better. Hydrate and Humidify  Drink enough water to keep your pee (urine) clear or pale yellow.  Use a cool mist humidifier to keep the humidity level in your home above 50%.  Breathe in steam for 10-15 minutes, 3-4 times a day or as told by your doctor. You can do this in the bathroom while a hot shower is running.  Try not to spend time in cool or dry air. Rest  Rest as much as possible.  Sleep with your head raised (elevated).  Make sure to get enough sleep each night. General instructions  Put a warm, moist washcloth on your face 3-4 times a day or as told by your doctor. This will help with discomfort.  Wash your hands often with soap and water. If  there is no soap and water, use hand sanitizer.  Do not smoke. Avoid being around people who are smoking (secondhand smoke).  Keep all follow-up visits as told by your doctor. This is important. Contact a doctor if:  You have a fever.  Your symptoms get worse.  Your symptoms do not get better within 10 days. Get help right away if:  You have a very bad headache.  You cannot stop throwing up (vomiting).  You have pain or swelling around your face or eyes.  You have trouble seeing.  You feel confused.  Your neck is stiff.  You have trouble breathing. This information is not intended to replace advice given to you by your health care provider. Make sure you discuss any questions you have with your health care provider. Document Released: 10/23/2007 Document Revised:  12/31/2015 Document Reviewed: 03/01/2015 Elsevier Interactive Patient Education  2018 [employer] Inc[employer]  

## 2017-09-08 ENCOUNTER — Ambulatory Visit (INDEPENDENT_AMBULATORY_CARE_PROVIDER_SITE_OTHER): Payer: BLUE CROSS/BLUE SHIELD | Admitting: Maternal Newborn

## 2017-09-08 ENCOUNTER — Encounter: Payer: Self-pay | Admitting: Maternal Newborn

## 2017-09-08 VITALS — BP 120/80 | HR 66 | Ht 69.0 in | Wt 156.0 lb

## 2017-09-08 DIAGNOSIS — Z01419 Encounter for gynecological examination (general) (routine) without abnormal findings: Secondary | ICD-10-CM

## 2017-09-08 NOTE — Progress Notes (Signed)
Gynecology Annual Exam  PCP: Patient, No Pcp Per  Chief Complaint:  Chief Complaint  Patient presents with  . Gynecologic Exam    History of Present Illness: Patient is a 37 y.o. G0P0000 presents for annual exam. The patient has seasonal allergies that are currently causing symptoms as detailed in ROS.  LMP: Patient's last menstrual period was 07/27/2017. Average Interval: regular, 28 days Duration of flow: 5 days Heavy Menses: no Clots: no Intermenstrual Bleeding: no Postcoital Bleeding: not applicable Dysmenorrhea: no  The patient is not currently sexually active. She currently uses NuvaRing vaginal inserts for cycle control/contraception.  The patient occasionally performs self breast exams.  There is notable family history of breast cancer in her paternal grandmother, she believes she was at an older age but uncertain about exact age of onset. There is no history of ovarian cancer in her family.  The patient wears seatbelts: yes.   The patient has regular exercise: no.    The patient denies current symptoms of depression.    Review of Systems  Constitutional: Negative.   HENT: Positive for congestion and sinus pain.   Eyes: Positive for redness.  Respiratory: Positive for cough. Negative for shortness of breath and wheezing.        Seasonal allergies, sneezing  Cardiovascular: Negative for chest pain and palpitations.  Gastrointestinal: Negative for abdominal pain, constipation, diarrhea, heartburn and nausea.  Genitourinary: Negative.   Musculoskeletal: Positive for joint pain.  Skin: Positive for itching. Negative for rash.  Neurological: Positive for headaches. Negative for sensory change.  Endo/Heme/Allergies: Negative.   Psychiatric/Behavioral: Negative for depression. The patient is not nervous/anxious.   All other systems reviewed and are negative.   Past Medical History:  Past Medical History:  Diagnosis Date  . Asthma   . History of migraine headaches     Abstract from medicat  . Uterine myoma     Past Surgical History:  Past Surgical History:  Procedure Laterality Date  . NASAL SINUS SURGERY Bilateral 1998   corrected nasal septum  . UTERINE FIBROID SURGERY  2005   in [place],horrible blood loss; not cancerous, "just a growth" some  being watched by OB/GYN.    Gynecologic History:  Patient's last menstrual period was 07/27/2017. Contraception: NuvaRing vaginal inserts Last Pap: 09/02/2016. Results were: NILM   Obstetric History: G0P0000  Family History:  Family History  Problem Relation Age of Onset  . Endometriosis Mother   . Hypertension Mother   . Depression Mother   . Migraines Mother   . Cancer Mother        brain tumors  . Skin cancer Father   . Hypertension Father   . Crohn's disease Father   . Asthma Father   . Breast cancer Paternal Grandmother   . Endometriosis Sister   . Depression Sister   . Asthma Brother   . Hip fracture Paternal Grandfather     Social History:  Social History   Socioeconomic History  . Marital status: Single    Spouse name: Not on file  . Number of children: Not on file  . Years of education: 92  . Highest education level: Not on file  Occupational History  . Occupation: professor  Social Needs  . Financial resource strain: Not on file  . Food insecurity:    Worry: Not on file    Inability: Not on file  . Transportation needs:    Medical: Not on file    Non-medical: Not on file  Tobacco  Use  . Smoking status: Never Smoker  . Smokeless tobacco: Never Used  Substance and Sexual Activity  . Alcohol use: No    Comment: less than once a week a drink  . Drug use: No  . Sexual activity: Not Currently    Birth control/protection: Inserts  Lifestyle  . Physical activity:    Days per week: Not on file    Minutes per session: Not on file  . Stress: Not on file  Relationships  . Social connections:    Talks on phone: Not on file    Gets together: Not on file     Attends religious service: Not on file    Active member of club or organization: Not on file    Attends meetings of clubs or organizations: Not on file    Relationship status: Not on file  . Intimate partner violence:    Fear of current or ex partner: Not on file    Emotionally abused: Not on file    Physically abused: Not on file    Forced sexual activity: Not on file  Other Topics Concern  . Not on file  Social History Narrative  . Not on file    Allergies:  Allergies  Allergen Reactions  . Aspirin Shortness Of Breath    Avoids it due to her asthma  . Iodine Rash    Medications: Prior to Admission medications   Medication Sig Start Date End Date Taking? Authorizing Provider  albuterol (PROVENTIL HFA;VENTOLIN HFA) 108 (90 Base) MCG/ACT inhaler Inhale into the lungs.    [provider]  amoxicillin-clavulanate (AUGMENTIN) 875-125 MG tablet Take 1 tablet 2 (two) times daily by mouth. 04/01/17   Ratcliffe, Heather R, PA-C  benzonatate (TESSALON) 100 MG capsule Take 1 capsule (100 mg total) 3 (three) times daily by mouth. As needed for cough 04/01/17   Ratcliffe, Estill Dooms, PA-C  etonogestrel-ethinyl estradiol (NUVARING) 0.12-0.015 MG/24HR vaginal ring Insert vaginally and leave in place for 3 consecutive weeks, then remove for 1 week. 09/02/16   Rod Can, CNM  ibuprofen (ADVIL,MOTRIN) 400 MG tablet Take by mouth.    [provider]  loratadine (CLARITIN) 10 MG tablet Take by mouth.    [provider]    Physical Exam Vitals: Blood pressure 120/80, pulse 66, height 5\' 9"  (1.753 m), weight 156 lb (70.8 kg), last menstrual period 07/27/2017.  General: NAD HEENT: normocephalic, anicteric Thyroid: no enlargement, no palpable nodules Pulmonary: No increased work of breathing, CTAB Cardiovascular: RRR, S1 and S2 auscultated Breast: Breasts symmetrical, no tenderness, no palpable nodules or masses, no skin or nipple retraction present, no nipple  discharge.  No axillary or supraclavicular lymphadenopathy. Abdomen: soft, non-tender, non-distended.  Umbilicus without lesions.  No hepatomegaly, splenomegaly or masses palpable. No evidence of hernia  Genitourinary:  External: Normal external female genitalia.  Normal urethral  meatus, normal Bartholin's and Skene's glands.    Vagina: Normal vaginal mucosa, no evidence of prolapse.    Cervix: Grossly normal in appearance, no bleeding  Uterus: Non-enlarged, mobile, normal contour.  No CMT  Adnexa: ovaries non-enlarged, no adnexal masses  Rectal: deferred  Lymphatic: no evidence of inguinal lymphadenopathy Extremities: no edema, erythema, or tenderness Neurologic: Grossly intact Psychiatric: mood appropriate, affect full  Assessment: 37 y.o. G0P0000 routine annual exam.  Plan: Problem List Items Addressed This Visit    None    Visit Diagnoses    Encounter for annual routine gynecological examination    -  Primary  1) STI screening was offered and declined.  2) ASCCP guidelines and rationale discussed.  Patient opts for every 3 year screening interval.  3) Contraception -She is using NuvaRing for cycle control/history of fibroid surgery and she wishes to continue on this method.  4) Routine healthcare maintenance including cholesterol, diabetes screening discussed: managed by provider in [place].  5) Follow up 1 year for routine annual exam. May need pelvic ultrasound next year to monitor fibroids if she does not see gynecologist in [place] this summer.  Avel Sensor, CNM 09/08/2017  9:13 AM

## 2017-10-17 ENCOUNTER — Telehealth: Payer: Self-pay

## 2017-10-17 ENCOUNTER — Other Ambulatory Visit: Payer: Self-pay | Admitting: Maternal Newborn

## 2017-10-17 DIAGNOSIS — Z30019 Encounter for initial prescription of contraceptives, unspecified: Secondary | ICD-10-CM

## 2017-10-17 MED ORDER — ETONOGESTREL-ETHINYL ESTRADIOL 0.12-0.015 MG/24HR VA RING: VAGINAL_RING | VAGINAL | 4 refills | Status: DC

## 2017-10-17 NOTE — Progress Notes (Signed)
Rx sent for 3 pack NuvaRing.

## 2017-10-17 NOTE — Telephone Encounter (Signed)
Pt requesting RX for nuvaring be changed to 3 month supply. fwding to [facility] for change of RX

## 2018-01-30 ENCOUNTER — Other Ambulatory Visit: Payer: Self-pay | Admitting: Medical

## 2018-01-30 DIAGNOSIS — Z0000 Encounter for general adult medical examination without abnormal findings: Secondary | ICD-10-CM

## 2018-01-30 NOTE — Progress Notes (Signed)
annual labs ordered for physical

## 2018-02-02 ENCOUNTER — Other Ambulatory Visit: Payer: Self-pay | Admitting: Medical

## 2018-02-02 DIAGNOSIS — Z114 Encounter for screening for human immunodeficiency virus [HIV]: Secondary | ICD-10-CM

## 2018-02-02 DIAGNOSIS — Z0189 Encounter for other specified special examinations: Secondary | ICD-10-CM

## 2018-02-02 DIAGNOSIS — E559 Vitamin D deficiency, unspecified: Secondary | ICD-10-CM

## 2018-02-02 NOTE — Progress Notes (Signed)
Labs are in for her physical.

## 2018-02-10 ENCOUNTER — Other Ambulatory Visit: Payer: BLUE CROSS/BLUE SHIELD

## 2018-02-10 ENCOUNTER — Ambulatory Visit: Payer: BLUE CROSS/BLUE SHIELD

## 2018-02-10 DIAGNOSIS — Z0000 Encounter for general adult medical examination without abnormal findings: Secondary | ICD-10-CM

## 2018-02-10 DIAGNOSIS — Z23 Encounter for immunization: Secondary | ICD-10-CM

## 2018-02-11 LAB — CMP12+LP+TP+TSH+6AC+CBC/D/PLT
A/G RATIO: 2.2 (ref 1.2–2.2)
ALBUMIN: 4.3 g/dL (ref 3.5–5.5)
ALT: 15 IU/L (ref 0–32)
AST: 14 IU/L (ref 0–40)
Alkaline Phosphatase: 91 IU/L (ref 39–117)
BILIRUBIN TOTAL: 0.4 mg/dL (ref 0.0–1.2)
BUN/Creatinine Ratio: 10 (ref 9–23)
BUN: 9 mg/dL (ref 6–20)
Basophils Absolute: 0 10*3/uL (ref 0.0–0.2)
Basos: 0 %
CREATININE: 0.89 mg/dL (ref 0.57–1.00)
Calcium: 8.7 mg/dL (ref 8.7–10.2)
Chloride: 106 mmol/L (ref 96–106)
Chol/HDL Ratio: 2.8 ratio (ref 0.0–4.4)
Cholesterol, Total: 177 mg/dL (ref 100–199)
EOS (ABSOLUTE): 0.1 10*3/uL (ref 0.0–0.4)
Eos: 2 %
FREE THYROXINE INDEX: 1.6 (ref 1.2–4.9)
GFR calc Af Amer: 95 mL/min/{1.73_m2} (ref >59)
GFR, EST NON AFRICAN AMERICAN: 82 mL/min/{1.73_m2} (ref >59)
GGT: 14 IU/L (ref 0–60)
GLUCOSE: 89 mg/dL (ref 65–99)
Globulin, Total: 2 g/dL (ref 1.5–4.5)
HDL: 63 mg/dL (ref >39)
Hematocrit: 41.1 % (ref 34.0–46.6)
Hemoglobin: 13.8 g/dL (ref 11.1–15.9)
IRON: 182 ug/dL — AB (ref 27–159)
Immature Grans (Abs): 0 10*3/uL (ref 0.0–0.1)
Immature Granulocytes: 0 %
LDH: 163 IU/L (ref 119–226)
LDL Calculated: 85 mg/dL (ref 0–99)
LYMPHS ABS: 2.4 10*3/uL (ref 0.7–3.1)
Lymphs: 38 %
MCH: 30.1 pg (ref 26.6–33.0)
MCHC: 33.6 g/dL (ref 31.5–35.7)
MCV: 90 fL (ref 79–97)
MONOS ABS: 0.4 10*3/uL (ref 0.1–0.9)
Monocytes: 6 %
Neutrophils Absolute: 3.4 10*3/uL (ref 1.4–7.0)
Neutrophils: 54 %
Phosphorus: 2.3 mg/dL — ABNORMAL LOW (ref 2.5–4.5)
Platelets: 277 10*3/uL (ref 150–450)
Potassium: 3.7 mmol/L (ref 3.5–5.2)
RBC: 4.59 x10E6/uL (ref 3.77–5.28)
RDW: 13.4 % (ref 12.3–15.4)
Sodium: 140 mmol/L (ref 134–144)
T3 UPTAKE RATIO: 20 % — AB (ref 24–39)
T4, Total: 8 ug/dL (ref 4.5–12.0)
TOTAL PROTEIN: 6.3 g/dL (ref 6.0–8.5)
TSH: 1.61 u[IU]/mL (ref 0.450–4.500)
Triglycerides: 144 mg/dL (ref 0–149)
URIC ACID: 3.9 mg/dL (ref 2.5–7.1)
VLDL CHOLESTEROL CAL: 29 mg/dL (ref 5–40)
WBC: 6.4 10*3/uL (ref 3.4–10.8)

## 2018-02-11 LAB — VITAMIN D 25 HYDROXY (VIT D DEFICIENCY, FRACTURES): Vit D, 25-Hydroxy: 24.6 ng/mL — ABNORMAL LOW (ref 30.0–100.0)

## 2018-02-24 ENCOUNTER — Ambulatory Visit: Payer: BLUE CROSS/BLUE SHIELD | Admitting: Medical

## 2018-02-24 VITALS — BP 117/68 | HR 70 | Temp 99.4°F | Resp 16 | Ht 69.0 in | Wt 158.2 lb

## 2018-02-24 DIAGNOSIS — Z0000 Encounter for general adult medical examination without abnormal findings: Secondary | ICD-10-CM

## 2018-02-24 DIAGNOSIS — E559 Vitamin D deficiency, unspecified: Secondary | ICD-10-CM

## 2018-02-24 DIAGNOSIS — Z114 Encounter for screening for human immunodeficiency virus [HIV]: Secondary | ICD-10-CM

## 2018-02-24 DIAGNOSIS — M62838 Other muscle spasm: Secondary | ICD-10-CM

## 2018-02-24 DIAGNOSIS — R790 Abnormal level of blood mineral: Secondary | ICD-10-CM

## 2018-02-24 LAB — POCT URINALYSIS DIPSTICK
Bilirubin, UA: NEGATIVE
Blood, UA: NEGATIVE
GLUCOSE UA: NEGATIVE
Ketones, UA: NEGATIVE
Leukocytes, UA: NEGATIVE
Nitrite, UA: NEGATIVE
Protein, UA: NEGATIVE
SPEC GRAV UA: 1.01 (ref 1.010–1.025)
Urobilinogen, UA: 0.2 E.U./dL
pH, UA: 6.5 (ref 5.0–8.0)

## 2018-02-24 NOTE — Progress Notes (Signed)
Subjective:    Patient ID: Madison Sullivan, female    DOB: 1980-10-20, 37 y.o.   MRN: [id number]  HPI 37 yo female in non acute distress.  Here for annual. 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 No primary complaints other than ROS.   Seasonal allergies bothering her ( little rain and ragweed pollen is high). Takes Loratadine, uses [place] nasal sprays to help with nasal congestion.  Itchy ears outside some of the time. Asthma well controlled with Albuterol MDI PRN.Marland Kitchen  Allergies  Allergen Reactions  . Aspirin Shortness Of Breath    Avoids it due to her asthma  . Iodine Rash   Past Medical History:  Diagnosis Date  . Asthma   . History of migraine headaches    Abstract from medicat  . Uterine myoma   . Social History   Socioeconomic History  . Marital status: Single    Spouse name: Not on file  . Number of children: Not on file  . Years of education: 79  . Highest education level: Not on file  Occupational History  . Occupation: professor  Social Needs  . Financial resource strain: Not on file  . Food insecurity:    Worry: Not on file    Inability: Not on file  . Transportation needs:    Medical: Not on file    Non-medical: Not on file  Tobacco Use  . Smoking status: Never Smoker  . Smokeless tobacco: Never Used  Substance and Sexual Activity  . Alcohol use: No    Comment: less than once a week a drink  . Drug use: No  . Sexual activity: Not Currently    Birth control/protection: Inserts  Lifestyle  . Physical activity:    Days per week: Not on file    Minutes per session: Not on file  . Stress: Not on file  Relationships  . Social connections:    Talks on phone: Not on file    Gets together: Not on file    Attends religious service: Not on file    Active member of club or organization: Not on file    Attends meetings of clubs or organizations: Not on file    Relationship status: Not on file  . Intimate  partner violence:    Fear of current or ex partner: Not on file    Emotionally abused: Not on file    Physically abused: Not on file    Forced sexual activity: Not on file  Other Topics Concern  . Not on file  Social History Narrative  . Not on file   Recent Results (from the past 2160 hour(s))  VITAMIN D 25 Hydroxy (Vit-D Deficiency, Fractures)     Status: Abnormal   Collection Time: 02/10/18  8:07 AM  Result Value Ref Range   Vit D, 25-Hydroxy 24.6 (L) 30.0 - 100.0 ng/mL    Comment: Vitamin D deficiency has been defined by the [place] practice guideline as a level of serum 25-OH vitamin D less than 20 ng/mL (1,2). The Endocrine Society went on to further define vitamin D insufficiency as a level between 21 and 29 ng/mL (2). 1. IOM (Institute of Medicine). 2010. Dietary reference    intakes for calcium and D. [place]: The    [employer]. 2. Holick MF, [place], Bischoff-Ferrari HA, et al.    Evaluation, treatment, and prevention of vitamin D  deficiency: an Endocrine Society clinical practice    guideline. JCEM. 2011 Jul; 96(7):1911-30.   CMP12+LP+TP+TSH+6AC+CBC/D/Plt     Status: Abnormal   Collection Time: 02/10/18  8:07 AM  Result Value Ref Range   Glucose 89 65 - 99 mg/dL   Uric Acid 3.9 2.5 - 7.1 mg/dL    Comment:            Therapeutic target for gout patients: <6.0   BUN 9 6 - 20 mg/dL   Creatinine, Ser 0.89 0.57 - 1.00 mg/dL   GFR calc non Af Amer 82 >59 mL/min/1.73   GFR calc Af Amer 95 >59 mL/min/1.73   BUN/Creatinine Ratio 10 9 - 23   Sodium 140 134 - 144 mmol/L   Potassium 3.7 3.5 - 5.2 mmol/L   Chloride 106 96 - 106 mmol/L   Calcium 8.7 8.7 - 10.2 mg/dL   Phosphorus 2.3 (L) 2.5 - 4.5 mg/dL   Total Protein 6.3 6.0 - 8.5 g/dL   Albumin 4.3 3.5 - 5.5 g/dL   Globulin, Total 2.0 1.5 - 4.5 g/dL   Albumin/Globulin Ratio 2.2 1.2 - 2.2   Bilirubin Total 0.4 0.0 - 1.2 mg/dL   Alkaline Phosphatase 91 39 -  117 IU/L   LDH 163 119 - 226 IU/L   AST 14 0 - 40 IU/L   ALT 15 0 - 32 IU/L   GGT 14 0 - 60 IU/L   Iron 182 (H) 27 - 159 ug/dL   Cholesterol, Total 177 100 - 199 mg/dL   Triglycerides 144 0 - 149 mg/dL   HDL 63 >39 mg/dL   VLDL Cholesterol Cal 29 5 - 40 mg/dL   LDL Calculated 85 0 - 99 mg/dL   Chol/HDL Ratio 2.8 0.0 - 4.4 ratio    Comment:                                   T. Chol/HDL Ratio                                             Men  Women                               1/2 Avg.Risk  3.4    3.3                                   Avg.Risk  5.0    4.4                                2X Avg.Risk  9.6    7.1                                3X Avg.Risk 23.4   11.0    Estimated CHD Risk  < 0.5 0.0 - 1.0 times avg.    Comment:                                   T.  Chol/HDL Ratio                                             Men  Women                               1/2 Avg.Risk  3.4    3.3                                   Avg.Risk  5.0    4.4                                2X Avg.Risk  9.6    7.1                                3X Avg.Risk 23.4   11.0 The CHD Risk is based on the T. Chol/HDL ratio.  Other factors affect CHD Risk such as hypertension, smoking, diabetes, severe obesity, and family history of pre- mature CHD.    TSH 1.610 0.450 - 4.500 uIU/mL   T4, Total 8.0 4.5 - 12.0 ug/dL   T3 Uptake Ratio 20 (L) 24 - 39 %   Free Thyroxine Index 1.6 1.2 - 4.9   WBC 6.4 3.4 - 10.8 x10E3/uL   RBC 4.59 3.77 - 5.28 x10E6/uL   Hemoglobin 13.8 11.1 - 15.9 g/dL   Hematocrit 41.1 34.0 - 46.6 %   MCV 90 79 - 97 fL   MCH 30.1 26.6 - 33.0 pg   MCHC 33.6 31.5 - 35.7 g/dL   RDW 13.4 12.3 - 15.4 %   Platelets 277 150 - 450 x10E3/uL   Neutrophils 54 Not Estab. %   Lymphs 38 Not Estab. %   Monocytes 6 Not Estab. %   Eos 2 Not Estab. %   Basos 0 Not Estab. %   Neutrophils Absolute 3.4 1.4 - 7.0 x10E3/uL   Lymphocytes Absolute 2.4 0.7 - 3.1 x10E3/uL   Monocytes Absolute 0.4 0.1 - 0.9 x10E3/uL    EOS (ABSOLUTE) 0.1 0.0 - 0.4 x10E3/uL   Basophils Absolute 0.0 0.0 - 0.2 x10E3/uL   Immature Granulocytes 0 Not Estab. %   Immature Grans (Abs) 0.0 0.0 - 0.1 x10E3/uL  POCT urinalysis dipstick     Status: Normal   Collection Time: 02/24/18 10:02 AM  Result Value Ref Range   Color, UA straw    Clarity, UA clear    Glucose, UA Negative Negative   Bilirubin, UA neg    Ketones, UA neg    Spec Grav, UA 1.010 1.010 - 1.025   Blood, UA neg    pH, UA 6.5 5.0 - 8.0   Protein, UA Negative Negative   Urobilinogen, UA 0.2 0.2 or 1.0 E.U./dL   Nitrite, UA neg    Leukocytes, UA Negative Negative   Appearance     Odor     Review of Systems  Constitutional: Positive for fatigue (thinks it was the heat.).  HENT: Positive for congestion (allergy related comes and goes), nosebleeds (occasional with dryness), postnasal drip and sneezing (sometimes).   Eyes: Negative.   Respiratory: Positive for cough (occasionally  with allergies) and wheezing (occasionally and uses albuterol inhaler). Negative for chest tightness and shortness of breath.   Cardiovascular: Positive for leg swelling (with being in the heat along time). Negative for chest pain and palpitations.  Gastrointestinal: Positive for constipation (occasionally) and diarrhea (garlic and pepers possibly tomatoes). Negative for nausea, rectal pain and vomiting.  Endocrine: Positive for heat intolerance (with very hot days).  Genitourinary: Negative.   Musculoskeletal: Positive for neck stiffness.  Skin: Negative.   Allergic/Immunologic: Positive for environmental allergies. Negative for food allergies and immunocompromised state.  Neurological: Positive for headaches (bad headaches with stretching, yoga hx of  scolioisis). Negative for dizziness, tremors, seizures, syncope, facial asymmetry, speech difficulty, weakness, light-headedness and numbness.  Hematological: Negative.   Psychiatric/Behavioral: Negative.    Foot better left side,  Wearing insoles in shoes. Headaches, sensitive to light, sleeping helps, takes Headache pain relief medication helps., Occasionally vomits with the headache. Also neck tension can causes a headache and she feels that the weather also triggers her headaches. They last 2-6 hours depending if she takes something for it.  With them occurring 2-3 I September and none in March.  Has headaches with hormone changes. Not many headaches over the last 2 weeks. No numbness or tingling , No visual changes. Uses a headache medication with caffeine which does help her headaches.     Objective:   Physical Exam  Constitutional: She is oriented to person, place, and time. Vital signs are normal. She appears well-developed and well-nourished.  HENT:  Head: Normocephalic and atraumatic.  Right Ear: External ear normal. A middle ear effusion is present.  Left Ear: External ear normal. A middle ear effusion is present.  Nose: Nose normal.  Mouth/Throat: Uvula is midline, oropharynx is clear and moist and mucous membranes are normal. Tonsils are 1+ on the right. Tonsils are 1+ on the left.  Eyes: Pupils are equal, round, and reactive to light. Conjunctivae, EOM and lids are normal.  Fundoscopic exam:      The right eye shows red reflex.       The left eye shows red reflex.  Neck: Trachea normal and normal range of motion. Neck supple. Normal carotid pulses, no hepatojugular reflux and no JVD present. No tracheal deviation present. No thyroid mass and no thyromegaly present.  Cardiovascular: Normal rate, regular rhythm, normal heart sounds, intact distal pulses and normal pulses.  Pulses:      Carotid pulses are 2+ on the right side, and 2+ on the left side.      Radial pulses are 2+ on the right side, and 2+ on the left side.       Femoral pulses are 2+ on the right side, and 2+ on the left side.      Popliteal pulses are 2+ on the right side, and 2+ on the left side.       Dorsalis pedis pulses are 2+ on the  right side, and 2+ on the left side.       Posterior tibial pulses are 2+ on the right side, and 2+ on the left side.  Pulmonary/Chest: Effort normal and breath sounds normal.  Abdominal: Soft. Normal aorta and bowel sounds are normal. There is no hepatosplenomegaly. There is no tenderness. No hernia.  Musculoskeletal: Normal range of motion.       Right shoulder: Normal.       Left shoulder: Normal.       Right elbow: Normal.      Left elbow: Normal.  Right wrist: Normal.       Left wrist: Normal.       Right hip: Normal.       Left hip: Normal.       Right knee: Normal.       Left knee: Normal.       Right ankle: Normal.       Left ankle: Normal.       Cervical back: Normal.       Thoracic back: Normal.       Lumbar back: Normal.       Right upper arm: Normal.       Left upper arm: Normal.       Right forearm: Normal.       Left forearm: Normal.       Right hand: Normal.       Left hand: Normal. Normal strength noted.       Right upper leg: Normal.       Left upper leg: Normal.       Right lower leg: Normal.       Left lower leg: Normal.       Right foot: Normal. There is normal range of motion and no deformity.       Left foot: Normal. There is normal range of motion and no deformity.  Feet:  Right Foot:  Skin Integrity: Negative for ulcer, blister, skin breakdown, erythema, warmth, callus or dry skin.  Left Foot:  Skin Integrity: Negative for ulcer, blister, skin breakdown, erythema, warmth, callus or dry skin.  Lymphadenopathy:    She has no cervical adenopathy.       Right cervical: No superficial cervical, no deep cervical and no posterior cervical adenopathy present.      Left cervical: No superficial cervical, no deep cervical and no posterior cervical adenopathy present.       Right: No inguinal, no supraclavicular and no epitrochlear adenopathy present.       Left: No inguinal, no supraclavicular and no epitrochlear adenopathy present.  Neurological: She is  alert and oriented to person, place, and time. She has normal strength. No cranial nerve deficit or sensory deficit. She displays a negative Romberg sign. GCS eye subscore is 4. GCS verbal subscore is 5. GCS motor subscore is 6.  Reflex Scores:      Brachioradialis reflexes are 2+ on the right side and 2+ on the left side.      Patellar reflexes are 2+ on the right side and 2+ on the left side.      Achilles reflexes are 2+ on the right side and 2+ on the left side. Skin: Skin is warm, dry and intact. Capillary refill takes less than 2 seconds.  Psychiatric: She has a normal mood and affect. Her speech is normal and behavior is normal. Judgment and thought content normal. Cognition and memory are normal.  Nursing note and vitals reviewed.  Breast exam declines preformed by her US OB/GYN this year. Urine dip  WNL   Trace of proteint on  02/04/18.   visual acuity right  20/30 right 20/30 left 20/20 both Assessment & Plan:  Annual exam, labs reviewed She has an OB/GYN in [place], visits during the summer and gets exam then, breast exam performed during her last visit this summer. Reviewed labs with patient, elevated iron. Prior history of Anemia.  Vit D deficiency  To take  Vit D3 2000IU/day recheck in  3 months.Decreased phosphorus. Takes no vitamins or supplements currently.. Massage ,  warm compresses to the neck ( tension) she declines muscle relaxers. Taking  Miralax For constipation for 6 months, recommended by the GI doctor for constipation.. Now taking it rarely. Labs placed in Epic for recheck in 2 months for iron recheck and CMP. She verbalizes understanding and has no questions at discharge.  Patient going to check her immunization record to see if  Tdap is up to date, She will contact me by phone or by MyChart. Encounter Diagnoses  Name Primary?  . Annual physical exam Yes  . Health care maintenance   . Abnormal blood level of iron   . Vitamin D deficiency   . Screening for HIV  without presence of risk factors   . Trapezius muscle spasm    Orders Placed This Encounter  Procedures  . Iron and TIBC    Standing Status:   Future    Standing Expiration Date:   02/25/2019  . Comprehensive metabolic panel    Standing Status:   Future    Standing Expiration Date:   02/25/2019  . HIV Antibody (routine testing w rflx)    Standing Status:   Future    Standing Expiration Date:   02/26/2019  . Visual acuity screening  . POCT urinalysis dipstick

## 2018-02-24 NOTE — Patient Instructions (Addendum)
Vit D 3 2000IU/day recheck your Vit D in 3 months.    Allegra or generic Fexofenadine capsules or tablets What is this medicine? FEXOFENADINE (fex oh FEN a deen) is an antihistamine. This medicine is used to treat or prevent symptoms of allergies. It is also used to help reduce itchy skin rash and hives. This medicine may be used for other purposes; ask your health care provider or pharmacist if you have questions. COMMON BRAND NAME(S): Allegra, Allegra Allergy 12 Hour, Allegra Allergy 24 Hour, Allegra Children's Allergy What should I tell my health care provider before I take this medicine? They need to know if you have any of these conditions: -kidney disease -an unusual or allergic reaction to fexofenadine, terfenadine, other medicines, foods, dyes, or preservatives -pregnant or trying to get pregnant -breast-feeding How should I use this medicine? Take this medicine by mouth with a full glass of water. Follow the directions on the prescription label. You may take this medicine with food or on an empty stomach. Take your medicine at regular intervals. Do not take it more often than directed. You may need to take this medicine for several days before your symptoms improve. Talk to your pediatrician regarding the use of this medicine in children. While this drug may be prescribed for children as young as 52 years old for selected conditions, precautions do apply. Overdosage: If you think you have taken too much of this medicine contact a poison control center or emergency room at once. NOTE: This medicine is only for you. Do not share this medicine with others. What if I miss a dose? If you miss a dose, take it as soon as you can. If it is almost time for your next dose, take only that dose. Do not take double or extra doses. What may interact with this medicine? -antacids -erythromycin -grapefruit, apple, or orange juice -ketoconazole -magnesium-containing products This list may not  describe all possible interactions. Give your health care provider a list of all the medicines, herbs, non-prescription drugs, or dietary supplements you use. Also tell them if you smoke, drink alcohol, or use illegal drugs. Some items may interact with your medicine. What should I watch for while using this medicine? Visit your doctor or health care professional for regular checks on your health. Tell your doctor or healthcare professional if your symptoms do not start to get better or if they get worse. What side effects may I notice from receiving this medicine? Side effects that you should report to your doctor or health care professional as soon as possible: -allergic reactions like skin rash, itching or hives, swelling of the face, lips, or tongue -breathing problems -chest pain -fast heartbeat -infection or fever Side effects that usually do not require medical attention (report to your doctor or health care professional if they continue or are bothersome): -cough -drowsiness -dry or irritated nose, mouth, or throat -headache -menstrual changes -pain -stomach upset, nausea This list may not describe all possible side effects. Call your doctor for medical advice about side effects. You may report side effects to FDA at 1-800-FDA-1088. Where should I keep my medicine? Keep out of the reach of children. Store at room temperature between 20 and 25 degrees C (68 and 77degrees F). Protect from moisture. Throw away any unused medicine after the expiration date. NOTE: This sheet is a summary. It may not cover all possible information. If you have questions about this medicine, talk to your doctor, pharmacist, or health care provider.  2018 Elsevier/Gold Standard (2006-02-05 12:30:00)  Hypophosphatemia Hypophosphatemia is when the level of phosphate in a person's blood is low. Phosphate is an important mineral (electrolyte) for the strength and structure of bones and teeth. Phosphate is also  important for muscle functioning. Low blood phosphate levels can cause a variety of symptoms and problems. What are the causes? Rapid (acute) onset of this condition may be caused by:  Alcoholism.  Severe burns.  Total parenteral nutrition.  Refeeding after a long period of starvation or poor nutrition.  Diabetic ketoacidosis.  Slower (chronic) onset of this condition may be caused by:  Chronic diarrhea.  Vitamin D deficiency.  Excessive use of antacids containing aluminum.  Excessive use of diuretics.  Hyperparathyroidism.  Use of steroid medicines.  Hypothyroidism.  Low levels of other electrolytes, such as magnesium (hypomagnesemia) or potassium (hypokalemia).  Genetic kidney problems, such as autosomal dominant hypophosphatemic rickets.  A condition caused by certain types of tumors (oncogenic osteomalacia).  What are the signs or symptoms? Symptoms of this condition include:  Bone pain.  Bowed legs.  Growth problems, such as short height.  Weak muscles.  Confusion.  Shortness of breath.  Seizures.  How is this diagnosed? This condition is usually diagnosed through blood tests. How is this treated? Treatment for this condition may include:  Phosphate given by mouth (orally) or given through an IV tube inserted into one of your veins. The method used for giving phosphate will depend on the severity of the condition.  Vitamin D.  Other treatment will depend on the cause of the condition. Follow these instructions at home:  Follow diet instructions from your health care provider or dietitian.  Keep all follow-up visits as told by your health care provider. This is important. Contact a health care provider if:  You develop increased weakness. Get help right away if:  You have chest pain.  You have difficulty breathing.  You think you may have a bone fracture.  You have severe pain in your joints or bones. This information is not intended  to replace advice given to you by your health care provider. Make sure you discuss any questions you have with your health care provider. Document Released: 08/21/2010 Document Revised: 09/19/2015 Document Reviewed: 09/21/2014 Elsevier Interactive Patient Education  2018 [employer].  Vitamin D Deficiency Vitamin D deficiency is when your body does not have enough vitamin D. Vitamin D is important because:  It helps your body use other minerals that your body needs.  It helps keep your bones strong and healthy.  It may help to prevent some diseases.  It helps your heart and other muscles work well.  You can get vitamin D by:  Eating foods with vitamin D in them.  Drinking or eating milk or other foods that have had vitamin D added to them.  Taking a vitamin D supplement.  Being in the sun.  Not getting enough vitamin D can make your bones become soft. It can also cause other health problems. Follow these instructions at home:  Take medicines and supplements only as told by your doctor.  Eat foods that have vitamin D. These include: ? Dairy products, cereals, or juices with added vitamin D. Check the label for vitamin D. ? Fatty fish like salmon or trout. ? Eggs. ? Oysters.  Do not use tanning beds.  Stay at a healthy weight. Lose weight, if needed.  Keep all follow-up visits as told by your doctor. This is important. Contact a doctor if:  Your symptoms do not go away.  You feel sick to your stomach (nauseous).  Youthrow up (vomit).  You poop less often than usual or you have trouble pooping (constipation). This information is not intended to replace advice given to you by your health care provider. Make sure you discuss any questions you have with your health care provider. Document Released: 04/25/2011 Document Revised: 10/12/2015 Document Reviewed: 09/21/2014 Elsevier Interactive Patient Education  2018 [place].  Neck Exercises Neck exercises can be  important for many reasons:  They can help you to improve and maintain flexibility in your neck. This can be especially important as you age.  They can help to make your neck stronger. This can make movement easier.  They can reduce or prevent neck pain.  They may help your upper back.  Ask your health care provider which neck exercises would be best for you. Exercises Neck Press Repeat this exercise 10 times. Do it first thing in the morning and right before bed or as told by your health care provider. 1. Lie on your back on a firm bed or on the floor with a pillow under your head. 2. Use your neck muscles to push your head down on the pillow and straighten your spine. 3. Hold the position as well as you can. Keep your head facing up and your chin tucked. 4. Slowly count to 5 while holding this position. 5. Relax for a few seconds. Then repeat.  Isometric Strengthening Do a full set of these exercises 2 times a day or as told by your health care provider. 1. Sit in a supportive chair and place your hand on your forehead. 2. Push forward with your head and neck while pushing back with your hand. Hold for 10 seconds. 3. Relax. Then repeat the exercise 3 times. 4. Next, do thesequence again, this time putting your hand against the back of your head. Use your head and neck to push backward against the hand pressure. 5. Finally, do the same exercise on either side of your head, pushing sideways against the pressure of your hand.  Prone Head Lifts Repeat this exercise 5 times. Do this 2 times a day or as told by your health care provider. 1. Lie face-down, resting on your elbows so that your chest and upper back are raised. 2. Start with your head facing downward, near your chest. Position your chin either on or near your chest. 3. Slowly lift your head upward. Lift until you are looking straight ahead. Then continue lifting your head as far back as you can stretch. 4. Hold your head up  for 5 seconds. Then slowly lower it to your starting position.  Supine Head Lifts Repeat this exercise 8-10 times. Do this 2 times a day or as told by your health care provider. 1. Lie on your back, bending your knees to point to the ceiling and keeping your feet flat on the floor. 2. Lift your head slowly off the floor, raising your chin toward your chest. 3. Hold for 5 seconds. 4. Relax and repeat.  Scapular Retraction Repeat this exercise 5 times. Do this 2 times a day or as told by your health care provider. 1. Stand with your arms at your sides. Look straight ahead. 2. Slowly pull both shoulders backward and downward until you feel a stretch between your shoulder blades in your upper back. 3. Hold for 10-30 seconds. 4. Relax and repeat.  Contact a health care provider if:  Your neck  pain or discomfort gets much worse when you do an exercise.  Your neck pain or discomfort does not improve within 2 hours after you exercise. If you have any of these problems, stop exercising right away. Do not do the exercises again unless your health care provider says that you can. Get help right away if:  You develop sudden, severe neck pain. If this happens, stop exercising right away. Do not do the exercises again unless your health care provider says that you can. Exercises Neck Stretch  Repeat this exercise 3-5 times. 1. Do this exercise while standing or while sitting in a chair. 2. Place your feet flat on the floor, shoulder-width apart. 3. Slowly turn your head to the right. Turn it all the way to the right so you can look over your right shoulder. Do not tilt or tip your head. 4. Hold this position for 10-30 seconds. 5. Slowly turn your head to the left, to look over your left shoulder. 6. Hold this position for 10-30 seconds.  Neck Retraction Repeat this exercise 8-10 times. Do this 3-4 times a day or as told by your health care provider. 1. Do this exercise while standing or while  sitting in a sturdy chair. 2. Look straight ahead. Do not bend your neck. 3. Use your fingers to push your chin backward. Do not bend your neck for this movement. Continue to face straight ahead. If you are doing the exercise properly, you will feel a slight sensation in your throat and a stretch at the back of your neck. 4. Hold the stretch for 1-2 seconds. Relax and repeat.  This information is not intended to replace advice given to you by your health care provider. Make sure you discuss any questions you have with your health care provider. Document Released: 04/17/2015 Document Revised: 10/12/2015 Document Reviewed: 11/14/2014 Elsevier Interactive Patient Education  [employer].

## 2018-02-26 ENCOUNTER — Encounter: Payer: Self-pay | Admitting: Medical

## 2018-03-24 ENCOUNTER — Encounter: Payer: Self-pay | Admitting: Medical

## 2018-04-07 ENCOUNTER — Other Ambulatory Visit: Payer: Self-pay

## 2018-04-07 ENCOUNTER — Other Ambulatory Visit: Payer: BLUE CROSS/BLUE SHIELD

## 2018-04-07 DIAGNOSIS — Z23 Encounter for immunization: Secondary | ICD-10-CM

## 2018-04-07 DIAGNOSIS — Z114 Encounter for screening for human immunodeficiency virus [HIV]: Secondary | ICD-10-CM

## 2018-04-07 DIAGNOSIS — R790 Abnormal level of blood mineral: Secondary | ICD-10-CM

## 2018-04-07 DIAGNOSIS — Z0000 Encounter for general adult medical examination without abnormal findings: Secondary | ICD-10-CM

## 2018-04-08 LAB — COMPREHENSIVE METABOLIC PANEL
A/G RATIO: 1.8 (ref 1.2–2.2)
ALBUMIN: 4.1 g/dL (ref 3.5–5.5)
ALT: 14 IU/L (ref 0–32)
AST: 13 IU/L (ref 0–40)
Alkaline Phosphatase: 90 IU/L (ref 39–117)
BILIRUBIN TOTAL: 0.4 mg/dL (ref 0.0–1.2)
BUN / CREAT RATIO: 14 (ref 9–23)
BUN: 10 mg/dL (ref 6–20)
CHLORIDE: 110 mmol/L — AB (ref 96–106)
CO2: 18 mmol/L — ABNORMAL LOW (ref 20–29)
Calcium: 8.5 mg/dL — ABNORMAL LOW (ref 8.7–10.2)
Creatinine, Ser: 0.73 mg/dL (ref 0.57–1.00)
GFR calc non Af Amer: 105 mL/min/{1.73_m2} (ref >59)
GFR, EST AFRICAN AMERICAN: 121 mL/min/{1.73_m2} (ref >59)
GLOBULIN, TOTAL: 2.3 g/dL (ref 1.5–4.5)
Glucose: 87 mg/dL (ref 65–99)
Potassium: 3.7 mmol/L (ref 3.5–5.2)
SODIUM: 142 mmol/L (ref 134–144)
TOTAL PROTEIN: 6.4 g/dL (ref 6.0–8.5)

## 2018-04-08 LAB — IRON AND TIBC
Iron Saturation: 48 % (ref 15–55)
Iron: 140 ug/dL (ref 27–159)
Total Iron Binding Capacity: 293 ug/dL (ref 250–450)
UIBC: 153 ug/dL (ref 131–425)

## 2018-04-08 LAB — HIV ANTIBODY (ROUTINE TESTING W REFLEX): HIV SCREEN 4TH GENERATION: NONREACTIVE

## 2018-07-22 ENCOUNTER — Encounter: Payer: Self-pay | Admitting: Medical

## 2018-07-22 ENCOUNTER — Ambulatory Visit: Payer: BLUE CROSS/BLUE SHIELD | Admitting: Medical

## 2018-07-22 VITALS — BP 133/82 | HR 85 | Temp 99.1°F | Resp 16 | Wt 159.6 lb

## 2018-07-22 DIAGNOSIS — J301 Allergic rhinitis due to pollen: Secondary | ICD-10-CM

## 2018-07-22 DIAGNOSIS — E559 Vitamin D deficiency, unspecified: Secondary | ICD-10-CM

## 2018-07-22 DIAGNOSIS — H6983 Other specified disorders of Eustachian tube, bilateral: Secondary | ICD-10-CM

## 2018-07-22 DIAGNOSIS — H9201 Otalgia, right ear: Secondary | ICD-10-CM

## 2018-07-22 DIAGNOSIS — J3489 Other specified disorders of nose and nasal sinuses: Secondary | ICD-10-CM

## 2018-07-22 DIAGNOSIS — M542 Cervicalgia: Secondary | ICD-10-CM

## 2018-07-22 DIAGNOSIS — H9202 Otalgia, left ear: Secondary | ICD-10-CM

## 2018-07-22 MED ORDER — AMOXICILLIN-POT CLAVULANATE 875-125 MG PO TABS: 1.0000 | ORAL_TABLET | Freq: Two times a day (BID) | ORAL | 0 refills | Status: DC

## 2018-07-22 MED ORDER — SALINE SPRAY 0.65 % NA SOLN: 1.0000 | NASAL | 6 refills | Status: AC | PRN

## 2018-07-22 MED ORDER — IBUPROFEN 800 MG PO TABS: 800.0000 mg | ORAL_TABLET | Freq: Three times a day (TID) | ORAL | 0 refills | Status: DC | PRN

## 2018-07-22 MED ORDER — CYCLOBENZAPRINE HCL 5 MG PO TABS: 5.0000 mg | ORAL_TABLET | Freq: Every day | ORAL | 0 refills | Status: DC

## 2018-07-22 NOTE — Progress Notes (Signed)
Subjective:    Patient ID: Madison Sullivan, female    DOB: July 31, 1980, 38 y.o.   MRN: [id number]  HPI 38 yo female in non acute. Thinks she is having  Allergies x 7-10 days. Taking Zyrtec daily,switched back to Claritin. Dry nose with blowing nose trace blood.  Ears are itchy, pressure on ears, no trouble hearing.  History of Headaches, takes OTC Heaadache relief with ASA and caffeine, and Tylenol.If she catches it early this medication controls it.   Had a Migraine in December 22nd woke up with HA,  that was 10/10, lasted 24 hours, had vomiting, diarrhea. Some times has HA and she feel it is her neck is painful . Feel she has a knot on the left trapezius 4-5/10.Marland Kitchen No radiation down arm .Seeing [facility] every  2 weeks for massage.   History of Scoliosis and History of Migraines and Brain Tumor, not cancerous  Mother  Father and a Sister.  Blood pressure 133/82, pulse 85, temperature 99.1 F (37.3 C), temperature source Tympanic, resp. rate 16, weight 159 lb 9.6 oz (72.4 kg), last menstrual period 06/29/2018, SpO2 98 %. Allergies  Allergen Reactions  . Aspirin Shortness Of Breath    Avoids it due to her asthma  . Iodine Rash      Review of Systems  Constitutional: Negative for chills and fever.  HENT: Positive for congestion and ear pain (both with pressure and pain in the right). Negative for ear discharge, rhinorrhea (nasal dryness) and sore throat.   Respiratory: Negative for cough, chest tightness, shortness of breath and wheezing.   Musculoskeletal: Positive for neck pain.  Allergic/Immunologic: Positive for environmental allergies.  Neurological: Positive for headaches.   Hx of Asthma has not had to use inhaler.     Objective:   Physical Exam Vitals signs and nursing note reviewed.  Constitutional:      Appearance: Normal appearance. She is normal weight.  HENT:     Head: Normocephalic and atraumatic.     Jaw: There is normal jaw occlusion.     Right Ear:  Hearing, ear canal and external ear normal. A middle ear effusion is present.     Left Ear: Hearing, ear canal and external ear normal. A middle ear effusion is present. Tympanic membrane is erythematous (mild).     Nose: Congestion present.     Right Nostril: No occlusion.     Left Nostril: No occlusion.     Right Turbinates: Not enlarged or swollen.     Left Turbinates: Not enlarged or swollen.     Mouth/Throat:     Lips: Pink.     Mouth: Mucous membranes are moist.     Tongue: No lesions. Tongue does not protrude in midline.     Pharynx: Oropharynx is clear. Uvula midline. No oropharyngeal exudate or posterior oropharyngeal erythema.     Tonsils: No tonsillar exudate or tonsillar abscesses.  Eyes:     Extraocular Movements: Extraocular movements intact.     Conjunctiva/sclera: Conjunctivae normal.     Pupils: Pupils are equal, round, and reactive to light.  Neck:     Musculoskeletal: Normal range of motion and neck supple. No neck rigidity.  Cardiovascular:     Rate and Rhythm: Normal rate and regular rhythm.  Pulmonary:     Effort: Pulmonary effort is normal. No respiratory distress.     Breath sounds: Normal breath sounds. No stridor. No wheezing, rhonchi or rales.  Musculoskeletal: Normal range of motion.  Lymphadenopathy:  Cervical: No cervical adenopathy.  Skin:    General: Skin is warm and dry.  Neurological:     General: No focal deficit present.     Mental Status: She is alert and oriented to person, place, and time.  Psychiatric:        Mood and Affect: Mood normal.        Behavior: Behavior normal.        Thought Content: Thought content normal.        Judgment: Judgment normal.           Assessment & Plan:  Eustachian Tube Dysfunction Bilaterally with mildly pink TM on the Left. She is to take the antibiotic if pain starts. Neck pain  Left side Utilize muscle relaxer and motrin for neck pain as needed. Warm and /or cool compresses the the area of  pain. Ocean Nasal spry for  Dry nose. . Vitamin D deficiency   Meds ordered this encounter  Medications  . cyclobenzaprine (FLEXERIL) 5 MG tablet    Sig: Take 1 tablet (5 mg total) by mouth at bedtime.    Dispense:  20 tablet    Refill:  0  . amoxicillin-clavulanate (AUGMENTIN) 875-125 MG tablet    Sig: Take 1 tablet by mouth 2 (two) times daily.    Dispense:  20 tablet    Refill:  0  . ibuprofen (ADVIL,MOTRIN) 800 MG tablet    Sig: Take 1 tablet (800 mg total) by mouth every 8 (eight) hours as needed.    Dispense:  30 tablet    Refill:  0  . sodium chloride (OCEAN) 0.65 % SOLN nasal spray    Sig: Place 1 spray into both nostrils as needed for congestion.    Dispense:  1 Bottle    Refill:  6  ViT D3 2000IU/day. As discussed before. ( She forgets to take it). Reviewed labs with patient. Return in  2 weeks for recheck of neck , and allergies. Patient verbalizes understanding and has no questions at discharge.

## 2018-07-22 NOTE — Patient Instructions (Signed)
Allergies, Adult An allergy means that your body reacts to something that bothers it (allergen). It is not a normal reaction. This can happen from something that you:  Eat.  Breathe in.  Touch. You can have an allergy (be allergic) to:  Outdoor things, like: ? Pollen. ? Grass. ? Weeds.  Indoor things, like: ? Dust. ? Smoke. ? Pet dander.  Foods.  Medicines.  Things that bother your skin, like: ? Detergents. ? Chemicals. ? Latex.  Perfume.  Bugs. An allergy cannot spread from person to person (is not contagious). Follow these instructions at home:         Stay away from things that you know you are allergic to.  If you have allergies to things in the air, wash out your nose each day. Do it with one of these: ? A salt-water (saline) spray. ? A container (neti pot).  Take over-the-counter and prescription medicines only as told by your doctor.  Keep all follow-up visits as told by your doctor. This is important.  If you are at risk for a very bad allergy reaction (anaphylaxis), keep an auto-injector with you all the time. This is called an epinephrine injection. ? This is pre-measured medicine with a needle. You can put it into your skin by yourself. ? Right after you have a very bad allergy reaction, you or a person with you must give the medicine in less than a few minutes. This is an emergency.  If you have ever had a very bad allergy reaction, wear a medical alert bracelet or necklace. Your very bad allergy should be written on it. Contact a health care provider if:  Your symptoms do not get better with treatment. Get help right away if:  You have symptoms of a very bad allergy reaction. These include: ? A swollen mouth, tongue, or throat. ? Pain or tightness in your chest. ? Trouble breathing. ? Being short of breath. ? Dizziness. ? Fainting. ? Very bad pain in your belly (abdomen). ? Throwing up (vomiting). ? Watery poop  (diarrhea). Summary  An allergy means that your body reacts to something that bothers it (allergen). It is not a normal reaction.  Stay away from things that make your body react.  Take over-the-counter and prescription medicines only as told by your doctor.  If you are at risk for a very bad allergy reaction, carry an auto-injector (epinephrine injection) all the time. Also, wear a medical alert bracelet or necklace so people know about your allergy. This information is not intended to replace advice given to you by your health care provider. Make sure you discuss any questions you have with your health care provider. Document Released: 08/31/2012 Document Revised: 08/19/2016 Document Reviewed: 08/19/2016 Elsevier Interactive Patient Education  2019 [place], Adult An earache, or ear pain, can be caused by many things, including:  An infection.  Ear wax buildup.  Ear pressure.  Something in the ear that should not be there (foreign body).  A sore throat.  Tooth problems.  Jaw problems. Treatment of the earache will depend on the cause. If the cause is not clear or cannot be determined, you may need to watch your symptoms until your earache goes away or until a cause is found. Follow these instructions at home: Pay attention to any changes in your symptoms. Take these actions to help with your pain:  Take or apply over-the-counter and prescription medicines only as told by your health care provider.  If you were  prescribed an antibiotic medicine, use it as told by your health care provider. Do not stop using the antibiotic even if you start to feel better.  Do not put anything in your ear other than medicine that is prescribed by your health care provider.  If directed, apply heat to the affected area as often as told by your health care provider. Use the heat source that your health care provider recommends, such as a moist heat pack or a heating pad. ? Place a  towel between your skin and the heat source. ? Leave the heat on for 20-30 minutes. ? Remove the heat if your skin turns bright red. This is especially important if you are unable to feel pain, heat, or cold. You may have a greater risk of getting burned.  If directed, put ice on the ear: ? Put ice in a plastic bag. ? Place a towel between your skin and the bag. ? Leave the ice on for 20 minutes, 2-3 times a day.  Try resting in an upright position instead of lying down. This may help to reduce pressure in your ear and relieve pain.  Chew gum if it helps to relieve your ear pain.  Treat any allergies as told by your health care provider.  Keep all follow-up visits as told by your health care provider. This is important. Contact a health care provider if:  Your pain does not improve within 2 days.  Your earache gets worse.  You have new symptoms.  You have a fever. Get help right away if:  You have a severe headache.  You have a stiff neck.  You have trouble swallowing.  You have redness or swelling behind your ear.  You have fluid or blood coming from your ear.  You have hearing loss.  You feel dizzy. This information is not intended to replace advice given to you by your health care provider. Make sure you discuss any questions you have with your health care provider. Document Released: 12/22/2003 Document Revised: 01/02/2016 Document Reviewed: 10/30/2015 Elsevier Interactive Patient Education  [employer].

## 2018-08-03 ENCOUNTER — Ambulatory Visit: Payer: BLUE CROSS/BLUE SHIELD | Admitting: Medical

## 2018-08-03 ENCOUNTER — Encounter: Payer: Self-pay | Admitting: Medical

## 2018-08-03 ENCOUNTER — Other Ambulatory Visit: Payer: Self-pay

## 2018-08-03 VITALS — BP 128/84 | HR 99 | Temp 98.7°F | Resp 16

## 2018-08-03 DIAGNOSIS — R202 Paresthesia of skin: Secondary | ICD-10-CM

## 2018-08-03 DIAGNOSIS — J302 Other seasonal allergic rhinitis: Secondary | ICD-10-CM

## 2018-08-03 DIAGNOSIS — M542 Cervicalgia: Secondary | ICD-10-CM

## 2018-08-03 NOTE — Progress Notes (Signed)
Subjective:    Patient ID: Madison Sullivan, female    DOB: 1980-07-30, 38 y.o.   MRN: [id number]  HPI 38 yo female in non acute distress. Prob 1) Traveled to [place] one week ago. Her ears are better then when she was last seen on .07/20/2018. Wrote for Augmentin in case she needed an antibiotic while she was traveling. Neck pain on the left side  3/10, so has improved. No pain radiating into arms or weakness int he hands or arms. . Would like   Taking  OTC Loratadine and Flonase daily.  Problem  2) March  6th cutting frozen cheesecake ( 300 pieces) for an event.  Tingling in the right index lateral side of finger with paresthesia proximal (PIP)(DIP)and distal. Numbness has improved . No loss of  ROM or weakness in hand or arm     Review of Systems  Constitutional: Negative for chills and fever.  HENT: Positive for congestion. Negative for ear pain, sinus pressure, sinus pain and sore throat.   Respiratory: Negative for cough and shortness of breath.   Cardiovascular: Negative for chest pain.  Gastrointestinal: Negative for abdominal pain.  Musculoskeletal: Negative for myalgias.  Skin: Negative for rash.  Allergic/Immunologic: Positive for environmental allergies.  Neurological: Negative for dizziness, syncope and light-headedness.  Psychiatric/Behavioral: Negative for behavioral problems, self-injury and suicidal ideas.       Objective:   Physical Exam Vitals signs and nursing note reviewed.  Constitutional:      Appearance: Normal appearance. She is normal weight.  HENT:     Head: Normocephalic and atraumatic.     Right Ear: Ear canal and external ear normal.     Left Ear: Ear canal normal.     Mouth/Throat:     Mouth: Mucous membranes are moist.     Pharynx: Oropharynx is clear. No oropharyngeal exudate or posterior oropharyngeal erythema.  Eyes:     General: No scleral icterus.       Right eye: No discharge.        Left eye: No discharge.     Extraocular Movements:  Extraocular movements intact.     Pupils: Pupils are equal, round, and reactive to light.  Neck:     Musculoskeletal: Normal range of motion and neck supple. No neck rigidity or muscular tenderness.  Cardiovascular:     Rate and Rhythm: Normal rate and regular rhythm.     Heart sounds: No murmur. No friction rub. No gallop.   Pulmonary:     Effort: Pulmonary effort is normal. No respiratory distress.     Breath sounds: Normal breath sounds. No stridor. No wheezing, rhonchi or rales.  Musculoskeletal: Normal range of motion.  Lymphadenopathy:     Cervical: No cervical adenopathy.  Skin:    General: Skin is warm and dry.     Capillary Refill: Capillary refill takes less than 2 seconds.  Neurological:     General: No focal deficit present.     Mental Status: She is alert and oriented to person, place, and time.  Psychiatric:        Mood and Affect: Mood normal.        Behavior: Behavior normal.        Thought Content: Thought content normal.        Judgment: Judgment normal.    Right index mild swelling  CR less than 2 sec CR, FROM,5/5 grip of right hand.        Assessment & Plan:  Eustachian tube dysfunction  bilaterally Continue anti-histimine and nasal spray as directed.  Tingling medial side of right index finger, ice, elevate, OTC motrin 400mg  q6 hours with a 1-2 week follow up.  Patient verbalizes understanding and has no questions at discharge

## 2018-11-09 ENCOUNTER — Other Ambulatory Visit: Payer: Self-pay

## 2018-11-09 ENCOUNTER — Telehealth: Payer: BC Managed Care – PPO | Admitting: Medical

## 2018-11-09 ENCOUNTER — Other Ambulatory Visit: Payer: Self-pay | Admitting: Medical

## 2018-11-09 DIAGNOSIS — J45909 Unspecified asthma, uncomplicated: Secondary | ICD-10-CM

## 2018-11-09 DIAGNOSIS — J4520 Mild intermittent asthma, uncomplicated: Secondary | ICD-10-CM

## 2018-11-09 DIAGNOSIS — Z0000 Encounter for general adult medical examination without abnormal findings: Secondary | ICD-10-CM

## 2018-11-09 MED ORDER — FLUTICASONE-SALMETEROL 100-50 MCG/DOSE IN AEPB: 1.0000 | INHALATION_SPRAY | Freq: Two times a day (BID) | RESPIRATORY_TRACT | 3 refills | Status: DC

## 2018-11-09 NOTE — Progress Notes (Unsigned)
Patient gives permission to do telemedicine appointment.  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 Her Asthma is not bothering her now and she is not using her Albuterol MDI daily or even weekly.   I reviewed with the patient that using an inhaled steroid like Advair may help with any trouble breathing due to humidity or  Illness. She had been on it before with good results. She would like to have one on hand.   Recommendations at this time are to wear mask, social distancing 6 ft and not to be in a confined room for  Longer than 10 min. And hand wash often.   She also asked about traveling to [place] to visit her parents, she thinks that the requirement to avoid quarantine in [place] is to  have a Covid -19 negative test within 48 hours. I reviewed the testing protocol of the [facility] and the ordering and resulting of the the test procedures. She is not quite sure of the requirement and will reference [place]'s travel plans to see if this needs to be completed.    We will revisit her neck pain and headaches on her  next physical in October 2020, she knows she needs blood work prior to physical appointment.   We will chat again prior to the [employer] classes starting to see if she needs anything.         Meds ordered this encounter  Medications  . Fluticasone-Salmeterol (ADVAIR) 100-50 MCG/DOSE AEPB    Sig: Inhale 1 puff into the lungs 2 (two) times daily.    Dispense:  1 each    Refill:  3  Completed Wixela Inhaler  100-50 mcg Pre Auth. Reviewed with patient that she has to rinse out her mouth after use.    Mother PMhx: patient just found out. Her mother had a history of headaches.

## 2018-11-09 NOTE — Progress Notes (Signed)
See note in chart

## 2018-11-11 ENCOUNTER — Encounter: Payer: Self-pay | Admitting: Medical

## 2018-11-23 ENCOUNTER — Telehealth: Payer: Self-pay | Admitting: Medical

## 2018-11-23 NOTE — Telephone Encounter (Signed)
Called patient on 11/18/2018 to discuss HR paperwork for returning to work form concerning  Covid-19.  Reviewed with patient that I  Documented that patient will need surgical mask and face shield supply by her employer.  I also discussed with Davanee that it is common for people with Asthma to have difficulty with  URIs, in general.And seeing her PCP or other medical provider quickly can head off  If she is to get symptoms of Shortness of Breath , Chest Pain, sore throat , runny nose, diarrhea, or fever she is to make an appointment and see me promptly or another medical provider.  Reviewed with patient that she does not have moderate/servere Asthma. She uses her Albuterol MDI on occasion not "even weekly" sometimes.   Patient wanted to have virtual classroom only, however I do not feel this is a necessity at this time.   Patient verbalizes understanding and has no questions at this time.   A copy of the paperwork was supplied to the patient and to HR.

## 2018-12-15 ENCOUNTER — Encounter: Payer: Self-pay | Admitting: Maternal Newborn

## 2018-12-15 ENCOUNTER — Ambulatory Visit (INDEPENDENT_AMBULATORY_CARE_PROVIDER_SITE_OTHER): Payer: BC Managed Care – PPO | Admitting: Maternal Newborn

## 2018-12-15 ENCOUNTER — Other Ambulatory Visit: Payer: Self-pay

## 2018-12-15 ENCOUNTER — Other Ambulatory Visit (HOSPITAL_COMMUNITY)
Admission: RE | Admit: 2018-12-15 | Discharge: 2018-12-15 | Disposition: A | Discharge status: Home / Self Care | Payer: BC Managed Care – PPO | Source: Ambulatory Visit | Attending: Maternal Newborn | Admitting: Maternal Newborn

## 2018-12-15 VITALS — BP 112/70 | Ht 69.69 in | Wt 152.0 lb

## 2018-12-15 DIAGNOSIS — Z124 Encounter for screening for malignant neoplasm of cervix: Secondary | ICD-10-CM

## 2018-12-15 DIAGNOSIS — Z3044 Encounter for surveillance of vaginal ring hormonal contraceptive device: Secondary | ICD-10-CM

## 2018-12-15 DIAGNOSIS — Z30019 Encounter for initial prescription of contraceptives, unspecified: Secondary | ICD-10-CM

## 2018-12-15 DIAGNOSIS — Z01419 Encounter for gynecological examination (general) (routine) without abnormal findings: Secondary | ICD-10-CM | POA: Diagnosis not present

## 2018-12-15 MED ORDER — ETONOGESTREL-ETHINYL ESTRADIOL 0.12-0.015 MG/24HR VA RING: VAGINAL_RING | VAGINAL | 4 refills | Status: DC

## 2018-12-15 NOTE — Progress Notes (Signed)
Gynecology Annual Exam  PCP: Talmage Nap, PA-C  Chief Complaint:  Chief Complaint  Patient presents with  . Gynecologic Exam    History of Present Illness: Patient is a 38 y.o. G0P0000 presenting for an annual exam.    LMP: Patient's last menstrual period was 11/16/2018. Average Interval: regular, 28 days Duration of flow: 5 days Heavy Menses: no Clots: no Intermenstrual Bleeding: no Postcoital Bleeding: not applicable Dysmenorrhea: no  The patient is not currently sexually active. She currently uses NuvaRing vaginal inserts for cycle control. The patient does perform self breast exams.  There is a history of breast cancer in her paternal grandmother. There is no other family history of breast or ovarian cancer in her family.  The patient wears seatbelts: yes.   The patient has regular exercise: yes, walks 4-8 km per day.    The patient does not have current symptoms of depression.   She has some headaches that are worse when she is in the hormone free interval with her contraception (like menstrual migraines).  Review of Systems  Constitutional: Negative.   HENT: Negative.   Eyes: Negative.   Respiratory: Positive for cough. Negative for shortness of breath and wheezing.   Cardiovascular: Negative for chest pain and palpitations.  Gastrointestinal: Negative for abdominal pain.  Genitourinary: Negative.   Musculoskeletal:       Joint swelling  Skin: Negative.   Neurological: Positive for headaches.  Endo/Heme/Allergies: Positive for environmental allergies.       With coughing/sneezing   Psychiatric/Behavioral: Negative for depression. The patient is not nervous/anxious.   All other systems reviewed and are negative.   Past Medical History:  Past Medical History:  Diagnosis Date  . Asthma   . History of migraine headaches    Abstract from medicat  . Uterine myoma     Past Surgical History:  Past Surgical History:  Procedure Laterality Date  .  NASAL SINUS SURGERY Bilateral 1998   corrected nasal septum  . UTERINE FIBROID SURGERY  2005   in [place],horrible blood loss; not cancerous, "just a growth" some  being watched by OB/GYN.    Gynecologic History:  Patient's last menstrual period was 11/16/2018. Contraception: NuvaRing vaginal inserts Last Pap: 08/23/2016. Results were: NILM   Obstetric History: G0P0000  Family History:  Family History  Problem Relation Age of Onset  . Endometriosis Mother   . Hypertension Mother   . Depression Mother   . Migraines Mother   . Cancer Mother        brain tumors  . Skin cancer Father   . Hypertension Father   . Crohn's disease Father   . Asthma Father   . Breast cancer Paternal Grandmother   . Endometriosis Sister   . Depression Sister   . Asthma Brother   . Hip fracture Paternal Grandfather     Social History:  Social History   Socioeconomic History  . Marital status: Single    Spouse name: Not on file  . Number of children: Not on file  . Years of education: 79  . Highest education level: Not on file  Occupational History  . Occupation: professor  Social Needs  . Financial resource strain: Not on file  . Food insecurity    Worry: Not on file    Inability: Not on file  . Transportation needs    Medical: Not on file    Non-medical: Not on file  Tobacco Use  . Smoking status: Never Smoker  .  Smokeless tobacco: Never Used  Substance and Sexual Activity  . Alcohol use: No    Comment: less than once a week a drink  . Drug use: No  . Sexual activity: Not Currently    Birth control/protection: Inserts  Lifestyle  . Physical activity    Days per week: Not on file    Minutes per session: Not on file  . Stress: Not on file  Relationships  . Social Herbalist on phone: Not on file    Gets together: Not on file    Attends religious service: Not on file    Active member of club or organization: Not on file    Attends meetings of clubs or organizations:  Not on file    Relationship status: Not on file  . Intimate partner violence    Fear of current or ex partner: Not on file    Emotionally abused: Not on file    Physically abused: Not on file    Forced sexual activity: Not on file  Other Topics Concern  . Not on file  Social History Narrative  . Not on file    Allergies:  Allergies  Allergen Reactions  . Aspirin Shortness Of Breath    Avoids it due to her asthma  . Iodine Rash    Medications: Prior to Admission medications   Medication Sig Start Date End Date Taking? Authorizing Provider  albuterol (PROVENTIL HFA;VENTOLIN HFA) 108 (90 Base) MCG/ACT inhaler Inhale into the lungs.   Yes [provider]  cyclobenzaprine (FLEXERIL) 5 MG tablet Take 1 tablet (5 mg total) by mouth at bedtime. 07/22/18  Yes Ratcliffe, Estill Dooms, PA-C  etonogestrel-ethinyl estradiol (NUVARING) 0.12-0.015 MG/24HR vaginal ring Insert vaginally and leave in place for 3 consecutive weeks, then remove for 1 week. 10/17/17  Yes Rexene Agent, CNM  fluticasone (FLONASE) 50 MCG/ACT nasal spray Place 2 sprays into both nostrils daily.   Yes [provider]  Fluticasone-Salmeterol (ADVAIR) 100-50 MCG/DOSE AEPB Inhale 1 puff into the lungs 2 (two) times daily. 11/09/18  Yes Ratcliffe, Heather R, PA-C  ibuprofen (ADVIL,MOTRIN) 800 MG tablet Take 1 tablet (800 mg total) by mouth every 8 (eight) hours as needed. 07/22/18  Yes Ratcliffe, Heather R, PA-C  loratadine (CLARITIN) 10 MG tablet Take by mouth.   Yes [provider]  sodium chloride (OCEAN) 0.65 % SOLN nasal spray Place 1 spray into both nostrils as needed for congestion. 07/22/18  Yes Talmage Nap, PA-C    Physical Exam Vitals: Blood pressure 112/70, height 5' 9.69" (1.77 m), weight 152 lb (68.9 kg), last menstrual period 11/16/2018.  General: NAD HEENT: normocephalic, anicteric Thyroid: no enlargement Pulmonary: No increased work of breathing, CTAB Cardiovascular: RRR, no  murmurs, rubs, or gallops Breast: Breasts symmetrical, no tenderness, no palpable nodules or masses, no skin or nipple retraction present, no nipple discharge.  No axillary or supraclavicular lymphadenopathy. Abdomen: Soft, non-tender, non-distended.  Umbilicus without lesions.  No hepatomegaly, splenomegaly or masses palpable. No evidence of hernia  Genitourinary:  External: Normal external female genitalia.  Normal urethral  meatus, normal Bartholin's and Skene's glands.    Vagina: Normal vaginal mucosa, no evidence of prolapse.    Cervix: Grossly normal in appearance, friable with sampling  Uterus: Non-enlarged, mobile, normal contour.  No CMT  Adnexa: ovaries non-enlarged, no adnexal masses  Rectal: deferred  Lymphatic: no evidence of inguinal lymphadenopathy Extremities: no edema, erythema, or tenderness Neurologic: Grossly intact Psychiatric: mood appropriate, affect full  Assessment: 38 y.o. G0P0000 here for an annual exam  Plan: Problem List Items Addressed This Visit    None    Visit Diagnoses    Women's annual routine gynecological examination    -  Primary   Pap smear for cervical cancer screening       Relevant Orders   Cytology - PAP   Encounter for female birth control       Encounter for surveillance of vaginal ring hormonal contraceptive device       Relevant Medications   etonogestrel-ethinyl estradiol (NUVARING) 0.12-0.015 MG/24HR vaginal ring      1) STI screening was offered and declined  2) ASCCP guidelines and rationale discussed.  Patient opts for every 3 year screening interval  3) Contraception - Satisfied with NuvaRing and wishes to continue. Discussed that her doctor in [place] had given her a topical preparation to help with headaches in the hormone free interval, will research options here in the [place].  4) Routine healthcare maintenance including cholesterol, diabetes screening managed by PCP  5) Follow up 1 year for an annual exam.  Avel Sensor, CNM 12/15/2018

## 2018-12-18 ENCOUNTER — Encounter: Payer: Self-pay | Admitting: Maternal Newborn

## 2018-12-18 ENCOUNTER — Other Ambulatory Visit: Payer: Self-pay | Admitting: Maternal Newborn

## 2018-12-18 LAB — CYTOLOGY - PAP
Diagnosis: NEGATIVE
HPV: NOT DETECTED

## 2018-12-18 MED ORDER — ESTRADIOL 1.25 MG/1.25GM TD GEL: 1.0000 "application " | TRANSDERMAL | 4 refills | Status: DC | PRN

## 2019-03-03 ENCOUNTER — Ambulatory Visit: Payer: BC Managed Care – PPO

## 2019-03-03 ENCOUNTER — Other Ambulatory Visit: Payer: Self-pay

## 2019-03-03 DIAGNOSIS — Z23 Encounter for immunization: Secondary | ICD-10-CM

## 2019-03-09 ENCOUNTER — Other Ambulatory Visit: Payer: Self-pay

## 2019-03-09 ENCOUNTER — Other Ambulatory Visit: Payer: BC Managed Care – PPO

## 2019-03-09 DIAGNOSIS — Z0000 Encounter for general adult medical examination without abnormal findings: Secondary | ICD-10-CM

## 2019-03-09 DIAGNOSIS — J4520 Mild intermittent asthma, uncomplicated: Secondary | ICD-10-CM

## 2019-03-09 LAB — POCT URINALYSIS DIPSTICK OB
Bilirubin, UA: NEGATIVE
Blood, UA: NEGATIVE
Glucose, UA: NEGATIVE
Ketones, UA: NEGATIVE
Leukocytes, UA: NEGATIVE
Nitrite, UA: NEGATIVE
Spec Grav, UA: <=1.005 — AB (ref 1.010–1.025)
Urobilinogen, UA: 0.2 E.U./dL
pH, UA: 7.5 (ref 5.0–8.0)

## 2019-03-10 LAB — MICROSCOPIC EXAMINATION: Casts: NONE SEEN /lpf

## 2019-03-10 LAB — CMP12+LP+TP+TSH+6AC+CBC/D/PLT
ALT: 17 IU/L (ref 0–32)
AST: 14 IU/L (ref 0–40)
Albumin/Globulin Ratio: 1.6 (ref 1.2–2.2)
Albumin: 4.1 g/dL (ref 3.8–4.8)
Alkaline Phosphatase: 98 IU/L (ref 39–117)
BUN/Creatinine Ratio: 10 (ref 9–23)
BUN: 10 mg/dL (ref 6–20)
Basophils Absolute: 0 10*3/uL (ref 0.0–0.2)
Basos: 1 %
Bilirubin Total: 0.4 mg/dL (ref 0.0–1.2)
Calcium: 9.1 mg/dL (ref 8.7–10.2)
Chloride: 107 mmol/L — ABNORMAL HIGH (ref 96–106)
Chol/HDL Ratio: 3.4 ratio (ref 0.0–4.4)
Cholesterol, Total: 201 mg/dL — ABNORMAL HIGH (ref 100–199)
Creatinine, Ser: 0.97 mg/dL (ref 0.57–1.00)
EOS (ABSOLUTE): 0.1 10*3/uL (ref 0.0–0.4)
Eos: 1 %
Estimated CHD Risk: <0.5 times avg. (ref 0.0–1.0)
Free Thyroxine Index: 2.4 (ref 1.2–4.9)
GFR calc Af Amer: 85 mL/min/{1.73_m2} (ref >59)
GFR calc non Af Amer: 74 mL/min/{1.73_m2} (ref >59)
GGT: 18 IU/L (ref 0–60)
Globulin, Total: 2.6 g/dL (ref 1.5–4.5)
Glucose: 90 mg/dL (ref 65–99)
HDL: 60 mg/dL (ref >39)
Hematocrit: 42.3 % (ref 34.0–46.6)
Hemoglobin: 14.2 g/dL (ref 11.1–15.9)
Immature Grans (Abs): 0 10*3/uL (ref 0.0–0.1)
Immature Granulocytes: 0 %
Iron: 124 ug/dL (ref 27–159)
LDH: 189 IU/L (ref 119–226)
LDL Chol Calc (NIH): 117 mg/dL — ABNORMAL HIGH (ref 0–99)
Lymphocytes Absolute: 2.9 10*3/uL (ref 0.7–3.1)
Lymphs: 38 %
MCH: 30.2 pg (ref 26.6–33.0)
MCHC: 33.6 g/dL (ref 31.5–35.7)
MCV: 90 fL (ref 79–97)
Monocytes Absolute: 0.5 10*3/uL (ref 0.1–0.9)
Monocytes: 7 %
Neutrophils Absolute: 4 10*3/uL (ref 1.4–7.0)
Neutrophils: 53 %
Phosphorus: 3.2 mg/dL (ref 3.0–4.3)
Platelets: 285 10*3/uL (ref 150–450)
Potassium: 4.1 mmol/L (ref 3.5–5.2)
RBC: 4.7 x10E6/uL (ref 3.77–5.28)
RDW: 12.2 % (ref 11.7–15.4)
Sodium: 141 mmol/L (ref 134–144)
T3 Uptake Ratio: 22 % — ABNORMAL LOW (ref 24–39)
T4, Total: 11 ug/dL (ref 4.5–12.0)
TSH: 1.82 u[IU]/mL (ref 0.450–4.500)
Total Protein: 6.7 g/dL (ref 6.0–8.5)
Triglycerides: 138 mg/dL (ref 0–149)
Uric Acid: 3.9 mg/dL (ref 2.5–7.1)
VLDL Cholesterol Cal: 24 mg/dL (ref 5–40)
WBC: 7.6 10*3/uL (ref 3.4–10.8)

## 2019-03-10 LAB — URINALYSIS, ROUTINE W REFLEX MICROSCOPIC
Bilirubin, UA: NEGATIVE
Glucose, UA: NEGATIVE
Ketones, UA: NEGATIVE
Nitrite, UA: NEGATIVE
RBC, UA: NEGATIVE
Specific Gravity, UA: 1.024 (ref 1.005–1.030)
Urobilinogen, Ur: 0.2 mg/dL (ref 0.2–1.0)
pH, UA: 6.5 (ref 5.0–7.5)

## 2019-03-10 LAB — VITAMIN D 25 HYDROXY (VIT D DEFICIENCY, FRACTURES): Vit D, 25-Hydroxy: 32.4 ng/mL (ref 30.0–100.0)

## 2019-03-15 NOTE — Progress Notes (Signed)
Subjective:    Patient ID: Anglique Vivenzio, female    DOB: 1980/09/26, 38 y.o.   MRN: [id number]  HPI 38 yo female in non acute distress.Here for annual exam.   Right ear  pops sometimes, has allergies with PND. Taklng Claritin.Not doing nasal steroid spray. BP Checks at home runs 105/64, 122/74, 121/74.   Blood pressure (!) 142/94, pulse 74, temperature 99.8 F (37.7 C), temperature source Tympanic, resp. rate 16, weight 151 lb 6.4 oz (68.7 kg), SpO2 98 %.  Just finished a cup of coffee prior to coming. Advair she stopped taking medication, thought it was keeping her awake.  Father with Crohns with history of requiring surgery.history of , HTN Seasonal Allergies, skin cancer  Depression in mother, and sister.  Review of Systems  Constitutional: Negative.   HENT: Positive for congestion, ear pain (sensitive R>L popping, itchy on the outside.), nosebleeds (with wearing mask alot. ), postnasal drip and sore throat (scratchy).   Eyes: Negative.   Respiratory: Negative.   Cardiovascular: Negative.   Gastrointestinal: Negative.   Musculoskeletal: Positive for back pain (last week had lower back pain, now better.) and neck pain.  Allergic/Immunologic: Positive for environmental allergies. Negative for food allergies.  Neurological: Positive for headaches (clusters 4-5 aweek then none for  10 days , takes ibuprofen at night and in the am generic excedrin migraine, she is managing them. last really bad one was last December  lasted for 36 hours, vomitng with sensitivity to sound and light,.).  Hematological: Negative.   Psychiatric/Behavioral: Negative.   Neck pain,  Walking  Every day 1/2-1 hours now at  Twice a week.  Wine causes nasal congestion.   Annual was in July  Home most day , wed and fridays all day on campus.  Objective:   Physical Exam Vitals signs and nursing note reviewed.  Constitutional:      Appearance: Normal appearance. She is well-developed, well-groomed and  normal weight.  HENT:     Head: Normocephalic and atraumatic.     Jaw: There is normal jaw occlusion.     Right Ear: Hearing, ear canal and external ear normal. A middle ear effusion is present. Tympanic membrane is erythematous.     Left Ear: Hearing, tympanic membrane, ear canal and external ear normal.     Nose: Nose normal.     Mouth/Throat:     Lips: Pink.     Mouth: Mucous membranes are moist.     Pharynx: Oropharynx is clear.     Tonsils: 1+ on the right. 1+ on the left.  Eyes:     General: Lids are normal.     Extraocular Movements: Extraocular movements intact.     Conjunctiva/sclera: Conjunctivae normal.     Pupils: Pupils are equal, round, and reactive to light.     Funduscopic exam:    Right eye: Red reflex present.        Left eye: Red reflex present. Neck:     Musculoskeletal: Normal range of motion and neck supple.  Cardiovascular:     Rate and Rhythm: Normal rate and regular rhythm.     Pulses: Normal pulses.          Carotid pulses are 2+ on the right side and 2+ on the left side.      Radial pulses are 2+ on the right side and 2+ on the left side.       Dorsalis pedis pulses are 2+ on the right side and 2+ on the  left side.       Posterior tibial pulses are 2+ on the right side and 2+ on the left side.     Heart sounds: Normal heart sounds.  Pulmonary:     Effort: Pulmonary effort is normal.     Breath sounds: Normal breath sounds.  Abdominal:     General: Abdomen is flat. Bowel sounds are normal.     Palpations: Abdomen is soft. There is no hepatomegaly, splenomegaly or mass.     Tenderness: There is no right CVA tenderness or left CVA tenderness.  Musculoskeletal: Normal range of motion.     Right lower leg: No edema.     Left lower leg: No edema.  Feet:     Right foot:     Skin integrity: Skin integrity normal.     Toenail Condition: Right toenails are normal.     Left foot:     Skin integrity: Skin integrity normal.     Toenail Condition: Left  toenails are normal.  Lymphadenopathy:     Cervical: No cervical adenopathy.  Skin:    General: Skin is warm and dry.     Capillary Refill: Capillary refill takes less than 2 seconds.  Neurological:     General: No focal deficit present.     Mental Status: She is alert and oriented to person, place, and time. Mental status is at baseline.     GCS: GCS eye subscore is 4. GCS verbal subscore is 5. GCS motor subscore is 6.     Cranial Nerves: Cranial nerves are intact.     Sensory: Sensation is intact.     Motor: Motor function is intact.     Coordination: Coordination is intact.     Gait: Gait is intact.     Deep Tendon Reflexes:     Reflex Scores:      Brachioradialis reflexes are 2+ on the right side and 2+ on the left side.      Patellar reflexes are 2+ on the right side and 2+ on the left side.      Achilles reflexes are 2+ on the right side and 2+ on the left side. Psychiatric:        Attention and Perception: Attention and perception normal.        Mood and Affect: Mood and affect normal.        Speech: Speech normal.        Behavior: Behavior normal. Behavior is cooperative.        Thought Content: Thought content normal.        Cognition and Memory: Memory normal.        Judgment: Judgment normal.    MAEW Gait wnl 5/5 upper and lower extremity strength 5/5 equal grips bilaterally  Recent Results (from the past 2160 hour(s))  VITAMIN D 25 Hydroxy (Vit-D Deficiency, Fractures)     Status: None   Collection Time: 03/09/19  8:18 AM  Result Value Ref Range   Vit D, 25-Hydroxy 32.4 30.0 - 100.0 ng/mL    Comment: Vitamin D deficiency has been defined by the [place] practice guideline as a level of serum 25-OH vitamin D less than 20 ng/mL (1,2). The Endocrine Society went on to further define vitamin D insufficiency as a level between 21 and 29 ng/mL (2). 1. IOM (Institute of Medicine). 2010. Dietary reference    intakes for calcium  and D. [place]: The    [employer]. 2.  Holick MF, [place], Bischoff-Ferrari HA, et al.    Evaluation, treatment, and prevention of vitamin D    deficiency: an Endocrine Society clinical practice    guideline. JCEM. 2011 Jul; 96(7):1911-30.   CMP12+LP+TP+TSH+6AC+CBC/D/Plt     Status: Abnormal   Collection Time: 03/09/19  8:18 AM  Result Value Ref Range   Glucose 90 65 - 99 mg/dL   Uric Acid 3.9 2.5 - 7.1 mg/dL    Comment:            Therapeutic target for gout patients: <6.0   BUN 10 6 - 20 mg/dL   Creatinine, Ser 0.97 0.57 - 1.00 mg/dL   GFR calc non Af Amer 74 >59 mL/min/1.73   GFR calc Af Amer 85 >59 mL/min/1.73   BUN/Creatinine Ratio 10 9 - 23   Sodium 141 134 - 144 mmol/L   Potassium 4.1 3.5 - 5.2 mmol/L   Chloride 107 (H) 96 - 106 mmol/L   Calcium 9.1 8.7 - 10.2 mg/dL   Phosphorus 3.2 3.0 - 4.3 mg/dL   Total Protein 6.7 6.0 - 8.5 g/dL   Albumin 4.1 3.8 - 4.8 g/dL   Globulin, Total 2.6 1.5 - 4.5 g/dL   Albumin/Globulin Ratio 1.6 1.2 - 2.2   Bilirubin Total 0.4 0.0 - 1.2 mg/dL   Alkaline Phosphatase 98 39 - 117 IU/L   LDH 189 119 - 226 IU/L   AST 14 0 - 40 IU/L   ALT 17 0 - 32 IU/L   GGT 18 0 - 60 IU/L   Iron 124 27 - 159 ug/dL   Cholesterol, Total 201 (H) 100 - 199 mg/dL   Triglycerides 138 0 - 149 mg/dL   HDL 60 >39 mg/dL   VLDL Cholesterol Cal 24 5 - 40 mg/dL   LDL Chol Calc (NIH) 117 (H) 0 - 99 mg/dL   Chol/HDL Ratio 3.4 0.0 - 4.4 ratio    Comment:                                   T. Chol/HDL Ratio                                             Men  Women                               1/2 Avg.Risk  3.4    3.3                                   Avg.Risk  5.0    4.4                                2X Avg.Risk  9.6    7.1                                3X Avg.Risk 23.4   11.0    Estimated CHD Risk  < 0.5 0.0 - 1.0 times avg.    Comment: The CHD Risk is based on the T. Chol/HDL ratio. Other factors  affect CHD Risk such as hypertension,  smoking, diabetes, severe obesity, and family history of premature CHD.    TSH 1.820 0.450 - 4.500 uIU/mL   T4, Total 11.0 4.5 - 12.0 ug/dL   T3 Uptake Ratio 22 (L) 24 - 39 %   Free Thyroxine Index 2.4 1.2 - 4.9   WBC 7.6 3.4 - 10.8 x10E3/uL   RBC 4.70 3.77 - 5.28 x10E6/uL   Hemoglobin 14.2 11.1 - 15.9 g/dL   Hematocrit 42.3 34.0 - 46.6 %   MCV 90 79 - 97 fL   MCH 30.2 26.6 - 33.0 pg   MCHC 33.6 31.5 - 35.7 g/dL   RDW 12.2 11.7 - 15.4 %   Platelets 285 150 - 450 x10E3/uL   Neutrophils 53 Not Estab. %   Lymphs 38 Not Estab. %   Monocytes 7 Not Estab. %   Eos 1 Not Estab. %   Basos 1 Not Estab. %   Neutrophils Absolute 4.0 1.4 - 7.0 x10E3/uL   Lymphocytes Absolute 2.9 0.7 - 3.1 x10E3/uL   Monocytes Absolute 0.5 0.1 - 0.9 x10E3/uL   EOS (ABSOLUTE) 0.1 0.0 - 0.4 x10E3/uL   Basophils Absolute 0.0 0.0 - 0.2 x10E3/uL   Immature Granulocytes 0 Not Estab. %   Immature Grans (Abs) 0.0 0.0 - 0.1 x10E3/uL  POC Urinalysis Dipstick OB     Status: Abnormal   Collection Time: 03/09/19  8:45 AM  Result Value Ref Range   Color, UA     Clarity, UA     Glucose, UA Negative Negative   Bilirubin, UA negative    Ketones, UA negative    Spec Grav, UA <=1.005 (A) 1.010 - 1.025   Blood, UA negative    pH, UA 7.5 5.0 - 8.0   POC,PROTEIN,UA Trace Negative, Trace, Small (1+), Moderate (2+), Large (3+), 4+   Urobilinogen, UA 0.2 0.2 or 1.0 E.U./dL   Nitrite, UA negative    Leukocytes, UA Negative Negative   Appearance     Odor    Urinalysis, Routine w reflex microscopic     Status: Abnormal   Collection Time: 03/09/19  8:47 AM  Result Value Ref Range   Specific Gravity, UA 1.024 1.005 - 1.030   pH, UA 6.5 5.0 - 7.5   Color, UA Yellow Yellow   Appearance Ur Clear Clear   Leukocytes,UA Trace (A) Negative   Protein,UA 1+ (A) Negative/Trace   Glucose, UA Negative Negative   Ketones, UA Negative Negative   RBC, UA Negative Negative   Bilirubin, UA Negative Negative   Urobilinogen, Ur 0.2 0.2  - 1.0 mg/dL   Nitrite, UA Negative Negative   Microscopic Examination See below:     Comment: Microscopic was indicated and was performed.  Microscopic Examination     Status: None   Collection Time: 03/09/19  8:47 AM   URINE  Result Value Ref Range   WBC, UA 0-5 0 - 5 /hpf   RBC 0-2 0 - 2 /hpf   Epithelial Cells (non renal) 0-10 0 - 10 /hpf   Casts None seen None seen /lpf   Mucus, UA Present Not Estab.   Bacteria, UA Few None seen/Few        Assessment & Plan:  Annual physical exam Elevated BP without diagnosis of hypertension. Otitis Media right Meds ordered this encounter  Medications  . amoxicillin-clavulanate (AUGMENTIN) 875-125 MG tablet    Sig: Take 1 tablet by mouth 2 (two) times daily.    Dispense:  20  tablet    Refill:  0  . fluconazole (DIFLUCAN) 150 MG tablet    Sig: Take 1 tablet (150 mg total) by mouth once for 1 dose. Take last day of antibiotics.    Dispense:  1 tablet    Refill:  0  Elevated LDL, diet and exercise discussed.  Vaccinated for  Influenza Tension Headaches currently patient manages them with Tylenol may be willing to try a migraine medication in the future. Sensitive GI system offered GI consult , she feels she can manage her symptoms. Eats fine. "does not bother me daily".  Restart OTC Flonase and  Normal saline nasal spray per package instructions Patient verbalizes understanding and has no questions at discharge.. Return to clinic  In 3-5 day if ear is not improving.  Patient contacted me on 02/22/2019 about  Ears popping a lot and it bothering her. Recommended Prednisone, prescribed. With follow up in 3-7 days if not improving. She verbalizes understanding.

## 2019-03-16 ENCOUNTER — Ambulatory Visit: Payer: BC Managed Care – PPO | Admitting: Medical

## 2019-03-16 ENCOUNTER — Other Ambulatory Visit: Payer: Self-pay

## 2019-03-16 ENCOUNTER — Encounter: Payer: Self-pay | Admitting: Medical

## 2019-03-16 VITALS — BP 142/94 | HR 74 | Temp 99.8°F | Resp 16 | Wt 151.4 lb

## 2019-03-16 DIAGNOSIS — H6983 Other specified disorders of Eustachian tube, bilateral: Secondary | ICD-10-CM

## 2019-03-16 DIAGNOSIS — Z0000 Encounter for general adult medical examination without abnormal findings: Secondary | ICD-10-CM

## 2019-03-16 DIAGNOSIS — H6501 Acute serous otitis media, right ear: Secondary | ICD-10-CM

## 2019-03-16 DIAGNOSIS — B373 Candidiasis of vulva and vagina: Secondary | ICD-10-CM

## 2019-03-16 DIAGNOSIS — B3731 Acute candidiasis of vulva and vagina: Secondary | ICD-10-CM

## 2019-03-16 MED ORDER — AMOXICILLIN-POT CLAVULANATE 875-125 MG PO TABS: 1.0000 | ORAL_TABLET | Freq: Two times a day (BID) | ORAL | 0 refills | Status: DC

## 2019-03-16 MED ORDER — FLUCONAZOLE 150 MG PO TABS: 150.0000 mg | ORAL_TABLET | Freq: Once | ORAL | 0 refills | Status: AC

## 2019-03-16 NOTE — Patient Instructions (Addendum)
Cholesterol Content in Foods Cholesterol is a waxy, fat-like substance that helps to carry fat in the blood. The body needs cholesterol in small amounts, but too much cholesterol can cause damage to the arteries and heart. Most people should eat less than 200 milligrams (mg) of cholesterol a day. Foods with cholesterol  Cholesterol is found in animal-based foods, such as meat, seafood, and dairy. Generally, low-fat dairy and lean meats have less cholesterol than full-fat dairy and fatty meats. The milligrams of cholesterol per serving (mg per serving) of common cholesterol-containing foods are listed below. Meat and other proteins  Egg - one large whole egg has 186 mg.  Veal shank - 4 oz has 141 mg.  Lean ground turkey (93% lean) - 4 oz has 118 mg.  Fat-trimmed lamb loin - 4 oz has 106 mg.  Lean ground beef (90% lean) - 4 oz has 100 mg.  Lobster - 3.5 oz has 90 mg.  Pork loin chops - 4 oz has 86 mg.  Canned salmon - 3.5 oz has 83 mg.  Fat-trimmed beef top loin - 4 oz has 78 mg.  Frankfurter - 1 frank (3.5 oz) has 77 mg.  Crab - 3.5 oz has 71 mg.  Roasted chicken without skin, white meat - 4 oz has 66 mg.  Light bologna - 2 oz has 45 mg.  Deli-cut turkey - 2 oz has 31 mg.  Canned tuna - 3.5 oz has 31 mg.  Bacon - 1 oz has 29 mg.  Oysters and mussels (raw) - 3.5 oz has 25 mg.  Mackerel - 1 oz has 22 mg.  Trout - 1 oz has 20 mg.  Pork sausage - 1 link (1 oz) has 17 mg.  Salmon - 1 oz has 16 mg.  Tilapia - 1 oz has 14 mg. Dairy  Soft-serve ice cream -  cup (4 oz) has 103 mg.  Whole-milk yogurt - 1 cup (8 oz) has 29 mg.  Cheddar cheese - 1 oz has 28 mg.  American cheese - 1 oz has 28 mg.  Whole milk - 1 cup (8 oz) has 23 mg.  2% milk - 1 cup (8 oz) has 18 mg.  Cream cheese - 1 tablespoon (Tbsp) has 15 mg.  Cottage cheese -  cup (4 oz) has 14 mg.  Low-fat (1%) milk - 1 cup (8 oz) has 10 mg.  Sour cream - 1 Tbsp has 8.5 mg.  Low-fat yogurt - 1 cup  (8 oz) has 8 mg.  Nonfat Greek yogurt - 1 cup (8 oz) has 7 mg.  Half-and-half cream - 1 Tbsp has 5 mg. Fats and oils  Cod liver oil - 1 tablespoon (Tbsp) has 82 mg.  Butter - 1 Tbsp has 15 mg.  Lard - 1 Tbsp has 14 mg.  Bacon grease - 1 Tbsp has 14 mg.  Mayonnaise - 1 Tbsp has 5-10 mg.  Margarine - 1 Tbsp has 3-10 mg. Exact amounts of cholesterol in these foods may vary depending on specific ingredients and brands. Foods without cholesterol Most plant-based foods do not have cholesterol unless you combine them with a food that has cholesterol. Foods without cholesterol include:  Grains and cereals.  Vegetables.  Fruits.  Vegetable oils, such as olive, canola, and sunflower oil.  Legumes, such as peas, beans, and lentils.  Nuts and seeds.  Egg whites. Summary  The body needs cholesterol in small amounts, but too much cholesterol can cause damage to the arteries and heart.    Most people should eat less than 200 milligrams (mg) of cholesterol a day. This information is not intended to replace advice given to you by your health care provider. Make sure you discuss any questions you have with your health care provider. Document Released: 12/31/2016 Document Revised: 04/18/2017 Document Reviewed: 12/31/2016 Elsevier Patient Education  [place].  Core Strength Exercises  Core exercises help to build strength in the muscles between your ribs and your hips (abdominal muscles). These muscles help to support your body and keep your spine stable. It is important to maintain strength in your core to prevent injury and pain. Some activities, such as yoga and Pilates, can help to strengthen core muscles. You can also strengthen core muscles with exercises at home. It is important to talk to your health care provider before you start a new exercise routine. What are the benefits of core strength exercises? Core strength exercises can:  Reduce back pain.  Help to rebuild  strength after a back or spine injury.  Help to prevent injury during physical activity, especially injuries to the back and knees. How to do core strength exercises Repeat these exercises 10-15 times, or until you are tired. Do exercises exactly as told by your health care provider and adjust them as directed. It is normal to feel mild stretching, pulling, tightness, or discomfort as you do these exercises. If you feel any pain while doing these exercises, stop. If your pain continues or gets worse when doing core exercises, contact your health care provider. You may want to use a padded yoga or exercise mat for strength exercises that are done on the floor. Bridging  1. Lie on your back on a firm surface with your knees bent and your feet flat on the floor. 2. Raise your hips so that your knees, hips, and shoulders form a straight line together. Keep your abdominal muscles tight. 3. Hold this position for 3-5 seconds. 4. Slowly lower your hips to the starting position. 5. Let your muscles relax completely between repetitions. Single-leg bridge 1. Lie on your back on a firm surface with your knees bent and your feet flat on the floor. 2. Raise your hips so that your knees, hips, and shoulders form a straight line together. Keep your abdominal muscles tight. 3. Lift one foot off the floor, then completely straighten that leg. 4. Hold this position for 3-5 seconds. 5. Put the straight leg back down in the bent position. 6. Slowly lower your hips to the starting position. 7. Repeat these steps using your other leg. Side bridge 1. Lie on your side with your knees bent. Prop yourself up on the elbow that is near the floor. 2. Using your abdominal muscles and your elbow that is on the floor, raise your body off the floor. Raise your hip so that your shoulder, hip, and foot form a straight line together. 3. Hold this position for 10 seconds. Keep your head and neck raised and away from your  shoulder (in their normal, neutral position). Keep your abdominal muscles tight. 4. Slowly lower your hip to the starting position. 5. Repeat and try to hold this position longer, working your way up to 30 seconds. Abdominal crunch 1. Lie on your back on a firm surface. Bend your knees and keep your feet flat on the floor. 2. Cross your arms over your chest. 3. Without bending your neck, tip your chin slightly toward your chest. 4. Tighten your abdominal muscles as you lift your chest  just high enough to lift your shoulder blades off of the floor. Do not hold your breath. You can do this with short lifts or long lifts. 5. Slowly return to the starting position. Bird dog 1. Get on your hands and knees, with your legs shoulder-width apart and your arms under your shoulders. Keep your back straight. 2. Tighten your abdominal muscles. 3. Raise one of your legs off the floor and straighten it. Try to keep it parallel to the floor. 4. Slowly lower your leg to the starting position. 5. Raise one of your arms off the floor and straighten it. Try to keep it parallel to the floor. 6. Slowly lower your arm to the starting position. 7. Repeat with the other arm and leg. If possible, try raising a leg and arm at the same time, on opposite sides of the body. For example, raise your left hand and your right leg. Plank 1. Lie on your belly. 2. Prop up your body onto your forearms and your feet, keeping your legs straight. Your body should make a straight line between your shoulders and feet. 3. Hold this position for 10 seconds while keeping your abdominal muscles tight. 4. Lower your body to the starting position. 5. Repeat and try to hold this position longer, working your way up to 30 seconds. Cross-core strengthening 1. Stand with your feet shoulder-width apart. 2. Hold a ball out in front of you. Keep your arms straight. 3. Tighten your abdominal muscles and slowly rotate at your waist from side to  side. Keep your feet flat. 4. Once you are comfortable, try repeating this exercise with a heavier ball. Top core strengthening 1. Stand about 18 inches (46 cm) in front of a wall, with your back to the wall. 2. Keep your feet flat and shoulder-width apart. 3. Tighten your abdominal muscles. 4. Bend your hips and knees. 5. Slowly reach between your legs to touch the wall behind you. 6. Slowly stand back up. 7. Raise your arms over your head and reach behind you. 8. Return to the starting position. General tips  Do not do any exercises that cause pain. If you have pain while exercising, talk to your health care provider.  Always stretch before and after doing these exercises. This can help prevent injury.  Maintain a healthy weight. Ask your health care provider what weight is healthy for you. Contact a health care provider if:  You have back pain that gets worse or does not go away.  You feel pain while doing core strength exercises. Get help right away if:  You have severe pain that does not get better with medicine. Summary  Core exercises help to build strength in the muscles between your ribs and your waist.  Core muscles help to support your body and keep your spine stable.  Some activities, such as yoga and Pilates, can help to strengthen core muscles.  Core strength exercises can help back pain and can prevent injury.  If you feel any pain while doing core strength exercises, stop. This information is not intended to replace advice given to you by your health care provider. Make sure you discuss any questions you have with your health care provider. Document Released: 09/25/2016 Document Revised: 08/26/2018 Document Reviewed: 09/25/2016 Elsevier Patient Education  2020 [employer].

## 2019-03-19 ENCOUNTER — Encounter: Payer: Self-pay | Admitting: Medical

## 2019-03-22 ENCOUNTER — Encounter: Payer: Self-pay | Admitting: Medical

## 2019-03-22 LAB — POCT URINALYSIS DIPSTICK
Bilirubin, UA: NEGATIVE
Blood, UA: NEGATIVE
Glucose, UA: NEGATIVE
Ketones, UA: NEGATIVE
Leukocytes, UA: NEGATIVE
Nitrite, UA: NEGATIVE
Protein, UA: NEGATIVE
Spec Grav, UA: 1.01 (ref 1.010–1.025)
Urobilinogen, UA: 0.2 E.U./dL
pH, UA: 6.5 (ref 5.0–8.0)

## 2019-03-26 ENCOUNTER — Encounter: Payer: Self-pay | Admitting: Medical

## 2019-03-26 MED ORDER — PREDNISONE 10 MG (21) PO TBPK: ORAL_TABLET | ORAL | 0 refills | Status: DC

## 2019-05-25 ENCOUNTER — Telehealth: Payer: Self-pay | Admitting: *Deleted

## 2019-05-25 NOTE — Telephone Encounter (Signed)
Seth Bake called [facility] today with C/O dizziness for approx 5 seconds with change in position, lying in bed to sitting up for example. She states "it is not affecting my daily activities but this is the 3rd week so I thought I'd call and see what you guys have to say about it". Denies any pain or other symptoms. Taking Claritin daily but not Flonase. Tried the [facility] 1st week of symptoms but did not feel a difference so stopped. After consultation with H.Ratliff PA-C advised pt she needs to have her ears looked at to eval for ETD vs infection vs need for steroids. First available appt with provider in person visit offered at 10am Thursday 05/27/19, pt accepted and states that will be fine. Also recommended she restart Flonase 2 sprays each nostril QD, stay on Claritin, and add Sudafed during the day if she tolerates it as it contains a stimulant. Getsemani verbalizes understanding of topics discussed and has no further needs or concerns at this time and will be here for appt 05/27/19.

## 2019-05-27 ENCOUNTER — Other Ambulatory Visit: Payer: Self-pay

## 2019-05-27 ENCOUNTER — Ambulatory Visit: Payer: BC Managed Care – PPO | Admitting: Nurse Practitioner

## 2019-05-27 ENCOUNTER — Encounter: Payer: Self-pay | Admitting: Nurse Practitioner

## 2019-05-27 VITALS — BP 118/80 | HR 107 | Temp 98.5°F | Resp 16 | Wt 153.0 lb

## 2019-05-27 DIAGNOSIS — R42 Dizziness and giddiness: Secondary | ICD-10-CM

## 2019-05-27 DIAGNOSIS — R519 Headache, unspecified: Secondary | ICD-10-CM

## 2019-05-27 DIAGNOSIS — H6593 Unspecified nonsuppurative otitis media, bilateral: Secondary | ICD-10-CM

## 2019-05-27 MED ORDER — HYDROXYZINE PAMOATE 25 MG PO CAPS: ORAL_CAPSULE | ORAL | 0 refills | Status: DC

## 2019-05-27 NOTE — Progress Notes (Signed)
   Subjective:    Patient ID: Madison Sullivan, female    DOB: 02/08/1981, 39 y.o.   MRN: [id number]  HPI Madison Sullivan is here today with c/o bilateral ear pressure and vertigo x 3 weeks. She reports she always has PND because of her allergies. She's reporting a h/a today as well that started today and extends across frontal area and the occipital area. She reports more pressure to the right ear but denies pain, SOB, nasal congestion, fever or other respiratory symptoms. She continually takes Claritin and Flonase 2 sprays twice daily. Her symptoms have not improved with current routine treatment and she has not taken the Sudafed that she was advised 2 days ago to resume because she hasn't picked it up yet. Isn't currently followed by allergist or ENT and reports has not in years. Feels asthma is pretty controlled and has more exercise or activity induced asthma.   Review of Systems  Constitutional: Negative for chills, fatigue and fever.  HENT: Positive for postnasal drip. Negative for ear pain, sinus pressure and sinus pain.   Respiratory: Negative for cough, shortness of breath and wheezing.   Cardiovascular: Negative for chest pain.  Gastrointestinal: Negative for diarrhea, nausea and vomiting.  Neurological: Positive for dizziness.       Intermittent dizziness lasting about 5 minutes that happens with positional changes       Objective:   Physical Exam Vitals reviewed.  Constitutional:      General: She is not in acute distress.    Appearance: She is well-developed.  HENT:     Head: Normocephalic and atraumatic.     Comments: No maxillary or frontal sinus pressure    Ears:     Comments: Bilateral ears with clear serous fluid and to intact TM with no erythema. She has bubble bilaterally with left bulging TM.    Mouth/Throat:     Mouth: Mucous membranes are moist.     Pharynx: Oropharynx is clear.  Neck:     Comments: Bilateral upper anterior cervical lymphadenopathy with tenderness on  palpation Cardiovascular:     Rate and Rhythm: Regular rhythm. Tachycardia present.     Heart sounds: Normal heart sounds.  Pulmonary:     Effort: Pulmonary effort is normal. No respiratory distress.     Breath sounds: Normal breath sounds. No stridor. No wheezing or rhonchi.  Chest:     Chest wall: No tenderness.  Abdominal:     General: Bowel sounds are normal.     Palpations: Abdomen is soft.     Tenderness: There is no abdominal tenderness.  Musculoskeletal:        General: Normal range of motion.     Cervical back: Normal range of motion and neck supple.  Lymphadenopathy:     Cervical: Cervical adenopathy present.  Skin:    General: Skin is warm and dry.  Neurological:     Mental Status: She is alert and oriented to person, place, and time.  Psychiatric:        Mood and Affect: Mood normal.           Assessment & Plan:  Pt reports periodic migraine type headaches which is relieved by generic Exedrin and she plans to take these today. Discussed ASA allergy which she reports she chooses not to take d/t asthma and not a true severe allergy

## 2019-05-27 NOTE — Patient Instructions (Addendum)
Hold your Claritin and start the Hydroxyzine as directed. If you have drowsiness, only take at night. Change Flonase to 1 spray twice daily Can start Sudafed as previously instructed as directed Fluids and rest encouraged You have a sinus headache today and if it does not improve or worsen follow up with your provider for further evaluation. It is okay to the the generic Exedrin as directed If no improvement in the next 3-5 days please call the clinic or return for re-evaluation

## 2019-06-03 ENCOUNTER — Other Ambulatory Visit: Payer: Self-pay

## 2019-06-03 ENCOUNTER — Ambulatory Visit: Payer: Self-pay | Admitting: Nurse Practitioner

## 2019-06-03 ENCOUNTER — Encounter: Payer: Self-pay | Admitting: Nurse Practitioner

## 2019-06-03 VITALS — BP 119/89 | HR 92 | Temp 97.0°F | Resp 16 | Ht 69.0 in | Wt 156.0 lb

## 2019-06-03 DIAGNOSIS — H6593 Unspecified nonsuppurative otitis media, bilateral: Secondary | ICD-10-CM

## 2019-06-03 MED ORDER — PREDNISONE 20 MG PO TABS: ORAL_TABLET | ORAL | 0 refills | Status: DC

## 2019-06-03 MED ORDER — HYDROXYZINE PAMOATE 25 MG PO CAPS: ORAL_CAPSULE | ORAL | 0 refills | Status: DC

## 2019-06-03 NOTE — Progress Notes (Signed)
   Subjective:    Patient ID: Madison Sullivan, female    DOB: 08/14/1980, 39 y.o.   MRN: [id number]  HPI Madison Sullivan is here today for f/u on ears. She reports that her vertigo has resolved and her ears feel less full, but still has pressure. She denies any new symptoms.She continues to take Sudafed and Hydroxyzine as directed. She feels as the day goes on the Sudafed wears off and her symptoms are worsened in the evening.    Review of Systems  Constitutional: Negative for fatigue and fever.  HENT: Negative for congestion, ear pain, sinus pressure and sinus pain.        Bilateral ear pressure  Respiratory: Negative for shortness of breath.   Cardiovascular: Negative for chest pain.       Objective:   Physical Exam Constitutional:      General: She is not in acute distress.    Appearance: Normal appearance.  HENT:     Head: Normocephalic and atraumatic.     Comments: No maxillary or frontal sinus tenderness    Right Ear: Ear canal normal.     Left Ear: Ear canal normal.     Ears:     Comments: Bilateral ears with clear serous fluid to intake nonerythematous TM    Nose: Nose normal.     Mouth/Throat:     Mouth: Mucous membranes are moist.     Pharynx: Oropharynx is clear. No oropharyngeal exudate or posterior oropharyngeal erythema.  Neck:     Comments: Right upper anterior lymphadenopahy Cardiovascular:     Rate and Rhythm: Normal rate and regular rhythm.     Pulses: Normal pulses.     Heart sounds: Normal heart sounds. No murmur.  Pulmonary:     Effort: Pulmonary effort is normal. No respiratory distress.     Breath sounds: Normal breath sounds.  Abdominal:     General: Abdomen is flat.     Palpations: Abdomen is soft.  Musculoskeletal:        General: Normal range of motion.     Cervical back: Normal range of motion and neck supple. No tenderness.  Lymphadenopathy:     Cervical: Cervical adenopathy present.  Skin:    General: Skin is warm and dry.  Neurological:   General: No focal deficit present.     Mental Status: She is alert and oriented to person, place, and time.  Psychiatric:        Mood and Affect: Mood normal.        Behavior: Behavior normal.           Assessment & Plan:

## 2019-06-03 NOTE — Patient Instructions (Signed)
Hi Madison Sullivan -Continue your Sudafed and Hydroxyzine as directed -Take the Prednisone as directed -Continue to keep yourself hydrated -Encouraged patient to call the office or primary care doctor for an appointment if no improvement in symptoms or if symptoms change or worsen after 72 hours of planned treatment. Patient verbalized understanding of all instructions given/reviewed and has no further questions or concerns at this time.

## 2019-07-12 ENCOUNTER — Other Ambulatory Visit: Payer: Self-pay

## 2019-07-12 ENCOUNTER — Telehealth: Payer: Self-pay | Admitting: Medical

## 2019-07-18 ENCOUNTER — Encounter: Payer: Self-pay | Admitting: Medical

## 2019-07-29 ENCOUNTER — Ambulatory Visit: Payer: Self-pay

## 2019-07-29 ENCOUNTER — Other Ambulatory Visit: Payer: Self-pay

## 2019-07-29 DIAGNOSIS — Z20822 Contact with and (suspected) exposure to covid-19: Secondary | ICD-10-CM

## 2019-07-29 LAB — POC COVID19 BINAXNOW: SARS Coronavirus 2 Ag: NEGATIVE

## 2019-07-29 NOTE — Addendum Note (Signed)
Addended by: Jacqulynn Cadet on: 07/29/2019 08:21 AM   Modules accepted: Orders

## 2019-07-30 LAB — NOVEL CORONAVIRUS, NAA: SARS-CoV-2, NAA: NOT DETECTED

## 2019-07-30 LAB — INPATIENT

## 2020-01-03 ENCOUNTER — Encounter: Payer: Self-pay | Admitting: Medical

## 2020-01-18 ENCOUNTER — Ambulatory Visit (INDEPENDENT_AMBULATORY_CARE_PROVIDER_SITE_OTHER): Payer: BC Managed Care – PPO | Admitting: Obstetrics

## 2020-01-18 ENCOUNTER — Other Ambulatory Visit: Payer: Self-pay

## 2020-01-18 VITALS — BP 130/74 | HR 70 | Resp 18 | Ht 66.0 in | Wt 158.6 lb

## 2020-01-18 DIAGNOSIS — G44029 Chronic cluster headache, not intractable: Secondary | ICD-10-CM

## 2020-01-18 DIAGNOSIS — Z3044 Encounter for surveillance of vaginal ring hormonal contraceptive device: Secondary | ICD-10-CM | POA: Diagnosis not present

## 2020-01-18 DIAGNOSIS — Z01419 Encounter for gynecological examination (general) (routine) without abnormal findings: Secondary | ICD-10-CM | POA: Diagnosis not present

## 2020-01-18 DIAGNOSIS — D219 Benign neoplasm of connective and other soft tissue, unspecified: Secondary | ICD-10-CM

## 2020-01-18 MED ORDER — BUTALBITAL-APAP-CAFFEINE 50-325-40 MG PO CAPS: 1.0000 | ORAL_CAPSULE | Freq: Four times a day (QID) | ORAL | 1 refills | Status: DC | PRN

## 2020-01-18 MED ORDER — ETONOGESTREL-ETHINYL ESTRADIOL 0.12-0.015 MG/24HR VA RING: VAGINAL_RING | VAGINAL | 4 refills | Status: DC

## 2020-01-18 NOTE — Progress Notes (Signed)
Gynecology Annual Exam  PCP: Talmage Nap, PA-C  Chief Complaint:  Chief Complaint  Patient presents with  . Gynecologic Exam    History of Present Illness: Madison Sullivan is a 39 y.o. G0P0000 presents for annual exam. The patient complains of an ongoing problem with headahces that exacerbate each month once she takes out her NuvaRing to have hr period. She has discussed this wtih her PCP.  Madison Sullivan is [place] with dual citizenship in the [place]. 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  Her menses are regular, they occur every month, and they last 4  days. Her flow is light. She does not have intermenstrual bleeding. Her last menstrual period was 01/11/2020. She denies dysmenorrhea. Last pap smear: 2020, results were NILM   The patient is not  sexually active. She currently uses Nuva Ring more for cycle control than for contraception. She does not have dyspareunia.  Since her last visit, she has had no significant changes in her health.  Her past medical history is remarkable for asthma, allergies and previous gyn surgery in [place] for fibroids.  The patient does perform self breast exams. Her last mammogram was not yet done due to her age.   paternal grandmother has had breast Cancer.Genetic testing has not been done.   There is no family history of ovarian cancer. Genetic testing has not been done.  The patient denies smoking.  She denies drinking.   She denies illegal drug use.  The patient reports exercising occasionally.  The patient denies current symptoms of depression.    Review of Systems: Review of Systems  Constitutional: Positive for malaise/fatigue.  HENT: Positive for congestion.   Eyes: Negative.   Cardiovascular: Negative.   Gastrointestinal: Negative.   Genitourinary: Negative.   Musculoskeletal: Negative.   Skin: Negative.   Neurological: Positive for headaches.  Endo/Heme/Allergies: Negative.   Psychiatric/Behavioral: Negative.       Past Medical History:  Past Medical History:  Diagnosis Date  . Asthma   . History of migraine headaches    Abstract from medicat  . Uterine myoma     Past Surgical History:  Past Surgical History:  Procedure Laterality Date  . NASAL SINUS SURGERY Bilateral 1998   corrected nasal septum  . UTERINE FIBROID SURGERY  2005   in [place],horrible blood loss; not cancerous, "just a growth" some  being watched by OB/GYN.    Family History:  Family History  Problem Relation Age of Onset  . Endometriosis Mother   . Hypertension Mother   . Depression Mother   . Migraines Mother   . Cancer Mother        brain tumors  . Skin cancer Father   . Hypertension Father   . Crohn's disease Father   . Asthma Father   . Breast cancer Paternal Grandmother   . Endometriosis Sister   . Depression Sister   . Asthma Brother   . Hip fracture Paternal Grandfather     Social History:  Social History   Socioeconomic History  . Marital status: Single    Spouse name: Not on file  . Number of children: Not on file  . Years of education: 7  . Highest education level: Not on file  Occupational History  . Occupation: professor  Tobacco Use  . Smoking status: Never Smoker  . Smokeless tobacco: Never Used  Vaping Use  . Vaping Use: Never used  Substance and Sexual Activity  . Alcohol use: No  Comment: less than once a week a drink  . Drug use: No  . Sexual activity: Not Currently    Birth control/protection: Inserts  Other Topics Concern  . Not on file  Social History Narrative  . Not on file   Social Determinants of Health   Financial Resource Strain:   . Difficulty of Paying Living Expenses: Not on file  Food Insecurity:   . Worried About Charity fundraiser in the Last Year: Not on file  . Ran Out of Food in the Last Year: Not on file  Transportation Needs:   . Lack of Transportation (Medical): Not on file  . Lack of Transportation (Non-Medical): Not on file  Physical  Activity:   . Days of Exercise per Week: Not on file  . Minutes of Exercise per Session: Not on file  Stress:   . Feeling of Stress : Not on file  Social Connections:   . Frequency of Communication with Friends and Family: Not on file  . Frequency of Social Gatherings with Friends and Family: Not on file  . Attends Religious Services: Not on file  . Active Member of Clubs or Organizations: Not on file  . Attends Archivist Meetings: Not on file  . Marital Status: Not on file  Intimate Partner Violence:   . Fear of Current or Ex-Partner: Not on file  . Emotionally Abused: Not on file  . Physically Abused: Not on file  . Sexually Abused: Not on file    Allergies:  Allergies  Allergen Reactions  . Aspirin Shortness Of Breath    Avoids it due to her asthma  . Iodine Rash    Medications: Prior to Admission medications   Medication Sig Start Date End Date Taking? Authorizing Provider  albuterol (PROVENTIL HFA;VENTOLIN HFA) 108 (90 Base) MCG/ACT inhaler Inhale into the lungs.   Yes [provider]  cyclobenzaprine (FLEXERIL) 5 MG tablet Take 1 tablet (5 mg total) by mouth at bedtime. Patient taking differently: Take 5 mg by mouth at bedtime as needed.  07/22/18  Yes Ratcliffe, Heather R, PA-C  Estradiol 1.25 IO/0.35DH GEL Place 1 application onto the skin as needed (Daily during hormone free interval). 12/18/18  Yes Rexene Agent, CNM  etonogestrel-ethinyl estradiol (NUVARING) 0.12-0.015 MG/24HR vaginal ring Insert vaginally and leave in place for 3 consecutive weeks, then remove for 1 week. 12/15/18  Yes Rexene Agent, CNM  fluticasone (FLONASE) 50 MCG/ACT nasal spray Place 2 sprays into both nostrils daily.   Yes [provider]  hydrOXYzine (VISTARIL) 25 MG capsule 1 tab PO TID PRN for dizziness. May cause drowsiness and take as night only if develops drowsiness 06/03/19  Yes Bartorelli, Brayton Layman, NP  ibuprofen (ADVIL,MOTRIN) 800 MG tablet Take 1 tablet  (800 mg total) by mouth every 8 (eight) hours as needed. 07/22/18  Yes Ratcliffe, Heather R, PA-C  loratadine (CLARITIN) 10 MG tablet Take by mouth.   Yes [provider]  predniSONE (DELTASONE) 20 MG tablet 2 tabs PO x 5 days 06/03/19  Yes Bartorelli, Monica, NP  sodium chloride (OCEAN) 0.65 % SOLN nasal spray Place 1 spray into both nostrils as needed for congestion. 07/22/18  Yes Rolanda Lundborg  Butalbital-APAP-Caffeine 74-163-84 MG capsule Take 1-2 capsules by mouth every 6 (six) hours as needed for headache. 01/18/20   Imagene Riches, CNM    Physical Exam Vitals: Blood pressure 130/74, pulse 70, resp. rate 18, height 5\' 6"  (1.676 m), weight 158 lb 9.6  oz (71.9 kg), last menstrual period 01/11/2020, SpO2 97 %.  General: NAD HEENT: normocephalic, anicteric Neck: no thyroid enlargement, no palpable nodules, no cervical lymphadenopathy  Pulmonary: No increased work of breathing, CTAB Cardiovascular: RRR, without murmur  Breast: Breast symmetrical, no tenderness, no palpable nodules or masses, no skin or nipple retraction present, no nipple discharge.  No axillary, infraclavicular or supraclavicular lymphadenopathy. Abdomen: Soft, non-tender, non-distended.  Umbilicus without lesions.  No hepatomegaly or masses palpable. No evidence of hernia. Genitourinary:  External: Normal external female genitalia.  Normal urethral meatus, normal  Bartholin's and Skene's glands.    Vagina: Normal vaginal mucosa, no evidence of prolapse.    Cervix: Grossly normal in appearance, no bleeding, non-tender  Uterus: Anteverted, normal size, shape, and consistency, mobile, and non-tender  Adnexa: No adnexal masses, non-tender  Rectal: deferred  Lymphatic: no evidence of inguinal lymphadenopathy Extremities: no edema, erythema, or tenderness Neurologic: Grossly intact Psychiatric: mood appropriate, affect full     Assessment: 39 y.o. G0P0000 No problem-specific Assessment & Plan notes  found for this encounter.   Plan:  We addressed her  Issue of headaches briefly today- she has planned to follow up with her PCP and is likely to get a Neuro referral as the headaches run in her family, and they impact her daily life for several days each month. A limited trail of Firicet has ben prescribed for her today, and she will let us know if the medication is effective.  1) Breast cancer screening - recommend monthly self breast exam. Mammogram was ordered today. This would be a baseline screening mammo.  2) STI screening was offered and declined.  3) Cervical cancer screening - Pap was done. ASCCP guidelines and rational discussed.  Patient opts for every 3 years screening interval  4) Contraception - Education given regarding options for contraception declined- not presently sexually active. She continues with her Nuva Ring for cycle control. Rx is renewed for this next year.  5) Routine healthcare maintenance including cholesterol and diabetes screening managed by PCP  She will return in one year for her next annual. Imagene Riches, CNM  01/18/2020 5:56 PM

## 2020-01-19 ENCOUNTER — Other Ambulatory Visit: Payer: Self-pay | Admitting: Medical

## 2020-01-19 DIAGNOSIS — Z0000 Encounter for general adult medical examination without abnormal findings: Secondary | ICD-10-CM

## 2020-01-19 NOTE — Progress Notes (Signed)
Annual labs EKG at  39 yo baseline

## 2020-01-20 ENCOUNTER — Other Ambulatory Visit: Payer: Self-pay

## 2020-01-20 ENCOUNTER — Other Ambulatory Visit: Payer: BC Managed Care – PPO

## 2020-01-20 DIAGNOSIS — Z0000 Encounter for general adult medical examination without abnormal findings: Secondary | ICD-10-CM

## 2020-01-20 LAB — POCT URINALYSIS DIPSTICK
Bilirubin, UA: NEGATIVE
Blood, UA: NEGATIVE
Glucose, UA: NEGATIVE
Ketones, UA: NEGATIVE
Leukocytes, UA: NEGATIVE
Nitrite, UA: NEGATIVE
Protein, UA: NEGATIVE
Spec Grav, UA: 1.015 (ref 1.010–1.025)
Urobilinogen, UA: 0.2 E.U./dL
pH, UA: 7 (ref 5.0–8.0)

## 2020-01-22 LAB — CBC WITH DIFFERENTIAL/PLATELET
Basophils Absolute: 0.1 10*3/uL (ref 0.0–0.2)
Basos: 1 %
EOS (ABSOLUTE): 0.1 10*3/uL (ref 0.0–0.4)
Eos: 2 %
Hematocrit: 42.8 % (ref 34.0–46.6)
Hemoglobin: 14.7 g/dL (ref 11.1–15.9)
Immature Grans (Abs): 0 10*3/uL (ref 0.0–0.1)
Immature Granulocytes: 0 %
Lymphocytes Absolute: 3 10*3/uL (ref 0.7–3.1)
Lymphs: 46 %
MCH: 30.7 pg (ref 26.6–33.0)
MCHC: 34.3 g/dL (ref 31.5–35.7)
MCV: 89 fL (ref 79–97)
Monocytes Absolute: 0.5 10*3/uL (ref 0.1–0.9)
Monocytes: 7 %
Neutrophils Absolute: 2.9 10*3/uL (ref 1.4–7.0)
Neutrophils: 44 %
Platelets: 276 10*3/uL (ref 150–450)
RBC: 4.79 x10E6/uL (ref 3.77–5.28)
RDW: 11.8 % (ref 11.7–15.4)
WBC: 6.6 10*3/uL (ref 3.4–10.8)

## 2020-01-22 LAB — VITAMIN D 25 HYDROXY (VIT D DEFICIENCY, FRACTURES): Vit D, 25-Hydroxy: 36.7 ng/mL (ref 30.0–100.0)

## 2020-01-27 ENCOUNTER — Other Ambulatory Visit: Payer: Self-pay

## 2020-01-27 ENCOUNTER — Telehealth: Payer: BC Managed Care – PPO | Admitting: Medical

## 2020-01-27 ENCOUNTER — Encounter: Payer: Self-pay | Admitting: Medical

## 2020-01-27 DIAGNOSIS — Z1159 Encounter for screening for other viral diseases: Secondary | ICD-10-CM

## 2020-01-27 DIAGNOSIS — E559 Vitamin D deficiency, unspecified: Secondary | ICD-10-CM

## 2020-01-27 DIAGNOSIS — G43109 Migraine with aura, not intractable, without status migrainosus: Secondary | ICD-10-CM

## 2020-01-27 DIAGNOSIS — Z0000 Encounter for general adult medical examination without abnormal findings: Secondary | ICD-10-CM

## 2020-01-27 DIAGNOSIS — Z711 Person with feared health complaint in whom no diagnosis is made: Secondary | ICD-10-CM

## 2020-01-27 NOTE — Progress Notes (Signed)
   Subjective:    Patient ID: Madison Sullivan, female    DOB: 1981/01/02, 39 y.o.   MRN: [id number]  HPI 39 yo  Female in non acute distress consents to telemedicine appointment.  Flu vaccine completed  [facility] Fluvaquad 2021-22  On Sunday.  Moderna second vaccine  August 31 2019 6 weeks after 1st vaccine received in [place].    Patient would like a Referral for annual skin screening she has a couple of skin areas that are of concern.   Review of Systems  HENT: Positive for rhinorrhea (with wearing mask all day) and sore throat (once a wihile).   Respiratory: Positive for shortness of breath. Negative for cough.   Cardiovascular: Negative for chest pain.  Gastrointestinal: Negative for abdominal pain, diarrhea, nausea and vomiting.     Has Aura symptoms , pressure behind one eye usually one side, history of Migraines x  3-4 years where they are dibilitating.    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  History of tightness in the neck and neck pain.  Objective:   Physical Exam  AXOX3 No physical performed due to telemedicine appointment      Assessment & Plan:  Labs Reviewed Low vit D level. Continue Vitamin D3 2000IU/day  10-15 mi of sun if possible a few times a week.Marland Kitchen Referral done for Dermatology- annual skin check. Referral done for Neurology - Migraine history for evaluation. Belinda to call patient and schedule Annual Labs and then annual physical appointment  in October after the 20th. Patient verbalizes understanding and has no questions at the end of our conversation.

## 2020-02-08 ENCOUNTER — Encounter: Payer: Self-pay | Admitting: Obstetrics

## 2020-02-08 ENCOUNTER — Other Ambulatory Visit: Payer: Self-pay

## 2020-02-08 ENCOUNTER — Ambulatory Visit (INDEPENDENT_AMBULATORY_CARE_PROVIDER_SITE_OTHER): Payer: BC Managed Care – PPO | Admitting: Obstetrics

## 2020-02-08 ENCOUNTER — Ambulatory Visit (INDEPENDENT_AMBULATORY_CARE_PROVIDER_SITE_OTHER): Payer: BC Managed Care – PPO

## 2020-02-08 ENCOUNTER — Other Ambulatory Visit: Payer: Self-pay | Admitting: Obstetrics

## 2020-02-08 VITALS — BP 110/80 | HR 74 | Ht 69.0 in | Wt 155.0 lb

## 2020-02-08 DIAGNOSIS — D219 Benign neoplasm of connective and other soft tissue, unspecified: Secondary | ICD-10-CM

## 2020-02-08 DIAGNOSIS — Z01419 Encounter for gynecological examination (general) (routine) without abnormal findings: Secondary | ICD-10-CM

## 2020-02-08 NOTE — Progress Notes (Signed)
S: Madison Sullivan returns to the office for a pelvic ultrasound to follow up on her uterine fibroids. She recently had an annual Well Woman exam, and shared her hx of fibroid surgery in [place]. 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 Today she simply wanted to see the ultrasound report. O:  See sono report 02/08/2020: Indications:Fibroids   Findings:  The uterus is anteverted and measures 7.9 x 5.2 x 3.5 cm. Echo texture is heterogenous with evidence of focal masses. Within the uterus are multiple suspected fibroids measuring: Fibroid 1: 33.9 x 32.8 x 31.1 mm subserosal left Fibroid 2:6.3 x 7.7 x 6.5 mm subserosal right Fibroid 3: 7.0 x 5.7 x 7.3 mm intramural right Fibroid 4: 11.1 x 10.3 x 11.0 mm subserosal right Fibroid 5: 10.4 x 7.7 x 9.4 mm subserosal right The Endometrium measures 1.3 mm.  Right Ovary measures 2.5 x 1.5 x 1.4 cm. It is normal in appearance. Left Ovary measures 3.3 x 1.7 x 1.9 cm. It is normal in appearance. Survey of the adnexa demonstrates no adnexal masses. There is no free fluid in the cul de sac.  Impression: 1. There are five uterine fibroids seen. 2. Thin endometrium.  3. Normal appearing ovaries.    A: 39 yo nulliparous patient with hx of uterine fibroids for folow up sono P: We discussed the appropriate follow up[. At this point, she prefers to have annual sonos to check for any possible growth. She does not indicate a desire for a pregnancy, and will continue using the [employer]. I suggested she might like to meet with Dr. Georgianne Fick in the future, and could provide copies of the operative report from [place] for him. RTC in one year for her next annual and pelvic ultrasound. Imagene Riches, CNM  02/08/2020 2:55 PM

## 2020-02-14 NOTE — Progress Notes (Signed)
Thanks for the perspective.  She would need counseling preconceptually to manage those fibroids to improve pregnancy and/or fertility outcome.  If not having symptoms, then would not otherwise advocate procedures until more follow up seen with the fibroid growth/regression.

## 2020-03-06 ENCOUNTER — Encounter: Payer: Self-pay | Admitting: Medical

## 2020-03-09 ENCOUNTER — Other Ambulatory Visit: Payer: Self-pay

## 2020-03-09 ENCOUNTER — Encounter: Payer: Self-pay | Admitting: Nurse Practitioner

## 2020-03-09 ENCOUNTER — Ambulatory Visit: Payer: BC Managed Care – PPO | Admitting: Nurse Practitioner

## 2020-03-09 VITALS — BP 142/91 | HR 79 | Temp 97.1°F | Resp 16

## 2020-03-09 DIAGNOSIS — J4520 Mild intermittent asthma, uncomplicated: Secondary | ICD-10-CM

## 2020-03-09 DIAGNOSIS — Z9109 Other allergy status, other than to drugs and biological substances: Secondary | ICD-10-CM

## 2020-03-09 MED ORDER — MONTELUKAST SODIUM 10 MG PO TABS: 10.0000 mg | ORAL_TABLET | Freq: Every day | ORAL | 3 refills | Status: DC

## 2020-03-09 NOTE — Progress Notes (Signed)
   Subjective:    Patient ID: Madison Sullivan, female    DOB: 08-Apr-1981, 39 y.o.   MRN: [id number]  HPI  39 year old female with a chronic history of sinus congestion/allergies and ear irritation. Her allergies have been a problem since moving to [place] from [place]. She has a history of asthma as well. She manages allergy symptoms with claritin and Flonase. She uses flonase every other day because it causes nasal irritation when used regularly.   Typically in spring and fall when her allergies are heightened she experienced more sinus congestion and will feel fullness in her ears and the sensation of fluid moving about when she moves her head. Typically she will end up with a cough and her asthma becomes exacerbated at times.   Review of Systems  Constitutional: Negative.   HENT: Positive for postnasal drip and rhinorrhea.   Eyes: Negative.   Cardiovascular: Negative.   Musculoskeletal: Negative.   Allergic/Immunologic: Positive for environmental allergies.   Today's Vitals   03/09/20 1151  BP: (!) 142/91  Pulse: 79  Resp: 16  Temp: (!) 97.1 F (36.2 C)  TempSrc: Temporal  SpO2: 98%   There is no height or weight on file to calculate BMI.     Past Medical History:  Diagnosis Date  . Asthma   . History of migraine headaches    Abstract from medicat  . Uterine myoma    Objective:   Physical Exam Constitutional:      Appearance: Normal appearance.  HENT:     Right Ear: Hearing, ear canal and external ear normal. No tenderness. A middle ear effusion is present. Tympanic membrane is not injected or bulging.     Left Ear: Hearing, tympanic membrane, ear canal and external ear normal. No tenderness. Tympanic membrane is not injected or bulging.     Nose:     Right Turbinates: Enlarged and swollen.     Mouth/Throat:     Mouth: Mucous membranes are dry.  Eyes:     Pupils: Pupils are equal, round, and reactive to light.  Skin:    General: Skin is warm and dry.  Neurological:      Mental Status: She is alert and oriented to person, place, and time.           Assessment & Plan:  Discussed options with patient including switching OTC antihistamine or increasing Flonase use and or starting Singulair for allergy control with underlying asthma.   She would like to start a trial of Singulair, she has a follow up with PCP Daryll Drown in 2 weeks. Advised her to continue Singulair until that time, return to clinic with any SEs or if symptoms of ear pressure persist or worsen prior to next appointment.   She may continue Claritin and Flonase for now, and may consider stopping Claritin if Singulair becomes effective.   Meds ordered this encounter  Medications  . montelukast (SINGULAIR) 10 MG tablet    Sig: Take 1 tablet (10 mg total) by mouth at bedtime.    Dispense:  30 tablet    Refill:  3

## 2020-03-14 ENCOUNTER — Encounter: Payer: Self-pay | Admitting: Medical

## 2020-03-17 ENCOUNTER — Other Ambulatory Visit: Payer: Self-pay

## 2020-03-17 ENCOUNTER — Other Ambulatory Visit: Payer: BC Managed Care – PPO

## 2020-03-17 DIAGNOSIS — Z1159 Encounter for screening for other viral diseases: Secondary | ICD-10-CM

## 2020-03-17 DIAGNOSIS — Z0000 Encounter for general adult medical examination without abnormal findings: Secondary | ICD-10-CM

## 2020-03-18 LAB — CMP12+LP+TP+TSH+6AC+CBC/D/PLT
ALT: 13 IU/L (ref 0–32)
AST: 16 IU/L (ref 0–40)
Albumin/Globulin Ratio: 2 (ref 1.2–2.2)
Albumin: 4.3 g/dL (ref 3.8–4.8)
Alkaline Phosphatase: 96 IU/L (ref 44–121)
BUN/Creatinine Ratio: 11 (ref 9–23)
BUN: 10 mg/dL (ref 6–24)
Basophils Absolute: 0 10*3/uL (ref 0.0–0.2)
Basos: 1 %
Bilirubin Total: 0.5 mg/dL (ref 0.0–1.2)
Calcium: 8.7 mg/dL (ref 8.7–10.2)
Chloride: 110 mmol/L — ABNORMAL HIGH (ref 96–106)
Chol/HDL Ratio: 3 ratio (ref 0.0–4.4)
Cholesterol, Total: 186 mg/dL (ref 100–199)
Creatinine, Ser: 0.95 mg/dL (ref 0.57–1.00)
EOS (ABSOLUTE): 0.1 10*3/uL (ref 0.0–0.4)
Eos: 1 %
Estimated CHD Risk: <0.5 times avg. (ref 0.0–1.0)
Free Thyroxine Index: 2 (ref 1.2–4.9)
GFR calc Af Amer: 87 mL/min/{1.73_m2} (ref >59)
GFR calc non Af Amer: 75 mL/min/{1.73_m2} (ref >59)
GGT: 13 IU/L (ref 0–60)
Globulin, Total: 2.1 g/dL (ref 1.5–4.5)
Glucose: 86 mg/dL (ref 65–99)
HDL: 62 mg/dL (ref >39)
Hematocrit: 41.6 % (ref 34.0–46.6)
Hemoglobin: 14.1 g/dL (ref 11.1–15.9)
Immature Grans (Abs): 0 10*3/uL (ref 0.0–0.1)
Immature Granulocytes: 0 %
Iron: 119 ug/dL (ref 27–159)
LDH: 208 IU/L (ref 119–226)
LDL Chol Calc (NIH): 100 mg/dL — ABNORMAL HIGH (ref 0–99)
Lymphocytes Absolute: 2.5 10*3/uL (ref 0.7–3.1)
Lymphs: 39 %
MCH: 30.7 pg (ref 26.6–33.0)
MCHC: 33.9 g/dL (ref 31.5–35.7)
MCV: 91 fL (ref 79–97)
Monocytes Absolute: 0.5 10*3/uL (ref 0.1–0.9)
Monocytes: 7 %
Neutrophils Absolute: 3.4 10*3/uL (ref 1.4–7.0)
Neutrophils: 52 %
Phosphorus: 2.5 mg/dL — ABNORMAL LOW (ref 3.0–4.3)
Platelets: 276 10*3/uL (ref 150–450)
Potassium: 4 mmol/L (ref 3.5–5.2)
RBC: 4.59 x10E6/uL (ref 3.77–5.28)
RDW: 12.3 % (ref 11.7–15.4)
Sodium: 142 mmol/L (ref 134–144)
T3 Uptake Ratio: 20 % — ABNORMAL LOW (ref 24–39)
T4, Total: 10.2 ug/dL (ref 4.5–12.0)
TSH: 1.84 u[IU]/mL (ref 0.450–4.500)
Total Protein: 6.4 g/dL (ref 6.0–8.5)
Triglycerides: 140 mg/dL (ref 0–149)
Uric Acid: 3.8 mg/dL (ref 2.6–6.2)
VLDL Cholesterol Cal: 24 mg/dL (ref 5–40)
WBC: 6.4 10*3/uL (ref 3.4–10.8)

## 2020-03-18 LAB — HCV AB W REFLEX TO QUANT PCR: HCV Ab: <0.1 s/co ratio (ref 0.0–0.9)

## 2020-03-18 LAB — HCV INTERPRETATION

## 2020-03-21 ENCOUNTER — Ambulatory Visit: Payer: BC Managed Care – PPO | Admitting: Medical

## 2020-03-28 ENCOUNTER — Ambulatory Visit: Payer: BC Managed Care – PPO | Admitting: Medical

## 2020-03-28 ENCOUNTER — Encounter: Payer: Self-pay | Admitting: Medical

## 2020-03-28 ENCOUNTER — Other Ambulatory Visit: Payer: Self-pay

## 2020-03-28 VITALS — BP 124/87 | HR 98 | Temp 96.8°F | Ht 69.0 in | Wt 156.6 lb

## 2020-03-28 DIAGNOSIS — Z0000 Encounter for general adult medical examination without abnormal findings: Secondary | ICD-10-CM

## 2020-03-28 LAB — POCT URINALYSIS DIPSTICK
Glucose, UA: NEGATIVE
Leukocytes, UA: NEGATIVE
Protein, UA: NEGATIVE
Spec Grav, UA: 1.01 (ref 1.010–1.025)
Urobilinogen, UA: NEGATIVE E.U./dL — AB
pH, UA: 7 (ref 5.0–8.0)

## 2020-03-28 NOTE — Progress Notes (Signed)
Subjective:    Patient ID: Madison Sullivan, female    DOB: 23-Feb-1981, 39 y.o.   MRN: [id number]  HPI 39 yo female in non acute distress here for annual physical. Influenza vaccine needed. Started on Singulair Needs UA   Gynecology up to date.  Review of Systems  HENT: Positive for sore throat.   Gastrointestinal: Positive for diarrhea (garlic, or oil levels.).   Using Nuvaring to delay due to them being a trigger for  Migraines due to fluxuations in hormones.    Objective:   Physical Exam Vitals and nursing note reviewed.  Constitutional:      Appearance: Normal appearance. She is well-developed, well-groomed and normal weight.  HENT:     Head: Normocephalic and atraumatic.     Jaw: There is normal jaw occlusion.     Right Ear: Ear canal and external ear normal. A middle ear effusion (mild) is present.     Left Ear: Ear canal and external ear normal. A middle ear effusion (mild) is present.     Nose: Nose normal.     Mouth/Throat:     Lips: Pink.     Mouth: Mucous membranes are moist.     Pharynx: Oropharynx is clear.  Eyes:     General: Lids are normal. Vision grossly intact. Gaze aligned appropriately.     Extraocular Movements: Extraocular movements intact.     Conjunctiva/sclera: Conjunctivae normal.     Pupils: Pupils are equal, round, and reactive to light.  Neck:     Trachea: Trachea normal.  Cardiovascular:     Rate and Rhythm: Normal rate and regular rhythm.     Pulses: Normal pulses.          Radial pulses are 2+ on the right side and 2+ on the left side.       Posterior tibial pulses are 2+ on the right side and 2+ on the left side.     Heart sounds: Normal heart sounds.  Pulmonary:     Effort: Pulmonary effort is normal.     Breath sounds: Normal breath sounds.  Abdominal:     General: Abdomen is flat. Bowel sounds are normal.     Palpations: Abdomen is soft.     Tenderness: There is no abdominal tenderness. There is no right CVA tenderness or left CVA  tenderness.  Musculoskeletal:        General: Normal range of motion.     Cervical back: Normal range of motion.     Right lower leg: No edema.     Left lower leg: No edema.  Lymphadenopathy:     Cervical: No cervical adenopathy.  Skin:    General: Skin is warm and dry.     Capillary Refill: Capillary refill takes less than 2 seconds.  Neurological:     General: No focal deficit present.     Mental Status: She is alert and oriented to person, place, and time.     GCS: GCS eye subscore is 4. GCS verbal subscore is 5. GCS motor subscore is 6.     Cranial Nerves: Cranial nerves are intact.     Motor: Motor function is intact.     Coordination: Coordination is intact. Romberg sign negative.     Gait: Gait is intact.     Deep Tendon Reflexes:     Reflex Scores:      Patellar reflexes are 2+ on the right side and 2+ on the left side. Psychiatric:  Attention and Perception: Attention and perception normal.        Mood and Affect: Mood and affect normal.        Speech: Speech normal.        Behavior: Behavior normal. Behavior is cooperative.        Thought Content: Thought content normal.        Cognition and Memory: Cognition and memory normal.        Judgment: Judgment normal.       Recent Results (from the past 2160 hour(s))  CBC with Differential/Platelet     Status: None   Collection Time: 01/20/20  8:02 AM  Result Value Ref Range   WBC 6.6 3.4 - 10.8 x10E3/uL   RBC 4.79 3.77 - 5.28 x10E6/uL   Hemoglobin 14.7 11.1 - 15.9 g/dL   Hematocrit 42.8 34.0 - 46.6 %   MCV 89 79 - 97 fL   MCH 30.7 26.6 - 33.0 pg   MCHC 34.3 31 - 35 g/dL   RDW 11.8 11.7 - 15.4 %   Platelets 276 150 - 450 x10E3/uL   Neutrophils 44 Not Estab. %   Lymphs 46 Not Estab. %   Monocytes 7 Not Estab. %   Eos 2 Not Estab. %   Basos 1 Not Estab. %   Neutrophils Absolute 2.9 1.40 - 7.00 x10E3/uL   Lymphocytes Absolute 3.0 0 - 3 x10E3/uL   Monocytes Absolute 0.5 0 - 0 x10E3/uL   EOS (ABSOLUTE) 0.1  0.0 - 0.4 x10E3/uL   Basophils Absolute 0.1 0 - 0 x10E3/uL   Immature Granulocytes 0 Not Estab. %   Immature Grans (Abs) 0.0 0.0 - 0.1 x10E3/uL  VITAMIN D 25 Hydroxy (Vit-D Deficiency, Fractures)     Status: None   Collection Time: 01/20/20  8:02 AM  Result Value Ref Range   Vit D, 25-Hydroxy 36.7 30.0 - 100.0 ng/mL    Comment: Vitamin D deficiency has been defined by the Institute of Medicine and an Endocrine Society practice guideline as a level of serum 25-OH vitamin D less than 20 ng/mL (1,2). The Endocrine Society went on to further define vitamin D insufficiency as a level between 21 and 29 ng/mL (2). 1. IOM (Institute of Medicine). 2010. Dietary reference    intakes for calcium and D. [place]: The    [employer]. 2. Holick MF, Binkley [place], Bischoff-Ferrari HA, et al.    Evaluation, treatment, and prevention of vitamin D    deficiency: an Endocrine Society clinical practice    guideline. JCEM. 2011 Jul; 96(7):1911-30.   POCT urinalysis dipstick     Status: Normal   Collection Time: 01/20/20  8:51 AM  Result Value Ref Range   Color, UA yellow    Clarity, UA clear    Glucose, UA Negative Negative   Bilirubin, UA negative    Ketones, UA Negative    Spec Grav, UA 1.015 1.010 - 1.025   Blood, UA Negative    pH, UA 7.0 5.0 - 8.0   Protein, UA Negative Negative   Urobilinogen, UA 0.2 0.2 or 1.0 E.U./dL   Nitrite, UA Negative    Leukocytes, UA Negative Negative   Appearance     Odor    HCV Ab w Reflex to Quant PCR     Status: None   Collection Time: 03/17/20  8:23 AM  Result Value Ref Range   HCV Ab <0.1 0.0 - 0.9 s/co ratio  CMP12+LP+TP+TSH+6AC+CBC/D/Plt     Status: Abnormal  Collection Time: 03/17/20  8:23 AM  Result Value Ref Range   Glucose 86 65 - 99 mg/dL   Uric Acid 3.8 2.6 - 6.2 mg/dL    Comment:            Therapeutic target for gout patients: <6.0   BUN 10 6 - 24 mg/dL   Creatinine, Ser 0.95 0.57 - 1.00 mg/dL   GFR calc non Af Amer 75 >59  mL/min/1.73   GFR calc Af Amer 87 >59 mL/min/1.73    Comment: **In accordance with recommendations from the NKF-ASN Task force,**   [facility] is in the process of updating its eGFR calculation to the   2021 CKD-EPI creatinine equation that estimates kidney function   without a race variable.    BUN/Creatinine Ratio 11 9 - 23   Sodium 142 134 - 144 mmol/L   Potassium 4.0 3.5 - 5.2 mmol/L   Chloride 110 (H) 96 - 106 mmol/L   Calcium 8.7 8.7 - 10.2 mg/dL   Phosphorus 2.5 (L) 3.0 - 4.3 mg/dL   Total Protein 6.4 6.0 - 8.5 g/dL   Albumin 4.3 3.8 - 4.8 g/dL   Globulin, Total 2.1 1.5 - 4.5 g/dL   Albumin/Globulin Ratio 2.0 1.2 - 2.2   Bilirubin Total 0.5 0.0 - 1.2 mg/dL   Alkaline Phosphatase 96 44 - 121 IU/L    Comment:               **Please note reference interval change**   LDH 208 119 - 226 IU/L   AST 16 0 - 40 IU/L   ALT 13 0 - 32 IU/L   GGT 13 0 - 60 IU/L   Iron 119 27 - 159 ug/dL   Cholesterol, Total 186 100 - 199 mg/dL   Triglycerides 140 0 - 149 mg/dL   HDL 62 >39 mg/dL   VLDL Cholesterol Cal 24 5 - 40 mg/dL   LDL Chol Calc (NIH) 100 (H) 0 - 99 mg/dL   Chol/HDL Ratio 3.0 0.0 - 4.4 ratio    Comment:                                   T. Chol/HDL Ratio                                             Men  Women                               1/2 Avg.Risk  3.4    3.3                                   Avg.Risk  5.0    4.4                                2X Avg.Risk  9.6    7.1                                3X Avg.Risk 23.4   11.0    Estimated CHD Risk  <  0.5 0.0 - 1.0 times avg.    Comment: The CHD Risk is based on the T. Chol/HDL ratio. Other factors affect CHD Risk such as hypertension, smoking, diabetes, severe obesity, and family history of premature CHD.    TSH 1.840 0.450 - 4.500 uIU/mL   T4, Total 10.2 4.5 - 12.0 ug/dL   T3 Uptake Ratio 20 (L) 24 - 39 %   Free Thyroxine Index 2.0 1.2 - 4.9   WBC 6.4 3.4 - 10.8 x10E3/uL   RBC 4.59 3.77 - 5.28 x10E6/uL   Hemoglobin 14.1  11.1 - 15.9 g/dL   Hematocrit 41.6 34.0 - 46.6 %   MCV 91 79 - 97 fL   MCH 30.7 26.6 - 33.0 pg   MCHC 33.9 31 - 35 g/dL   RDW 12.3 11.7 - 15.4 %   Platelets 276 150 - 450 x10E3/uL   Neutrophils 52 Not Estab. %   Lymphs 39 Not Estab. %   Monocytes 7 Not Estab. %   Eos 1 Not Estab. %   Basos 1 Not Estab. %   Neutrophils Absolute 3.4 1.40 - 7.00 x10E3/uL   Lymphocytes Absolute 2.5 0 - 3 x10E3/uL   Monocytes Absolute 0.5 0 - 0 x10E3/uL   EOS (ABSOLUTE) 0.1 0.0 - 0.4 x10E3/uL   Basophils Absolute 0.0 0 - 0 x10E3/uL   Immature Granulocytes 0 Not Estab. %   Immature Grans (Abs) 0.0 0.0 - 0.1 x10E3/uL  Interpretation:     Status: None   Collection Time: 03/17/20  8:23 AM  Result Value Ref Range   HCV Interp 1: Comment     Comment: Negative Not infected with HCV, unless recent infection is suspected or other evidence exists to indicate HCV infection.     EKG NSR 81 bpm UA wnl    Assessment & Plan:  Annual physical exam Labs reviewed , including EKG and urine all wnl H/O Migraines patient would like to hold off on seeing specialist at this time. Some foods patient is sensitive towards, given SIBO information , start a food diarly if possible. Return to clinic in  30 days for review of diet log and migraines. Patient verbalizes understanding and has no questions at discharge.

## 2020-03-30 ENCOUNTER — Other Ambulatory Visit (HOSPITAL_COMMUNITY): Payer: Self-pay | Admitting: Neurology

## 2020-03-30 ENCOUNTER — Other Ambulatory Visit: Payer: Self-pay | Admitting: Neurology

## 2020-03-30 DIAGNOSIS — Z8489 Family history of other specified conditions: Secondary | ICD-10-CM

## 2020-03-30 DIAGNOSIS — G43109 Migraine with aura, not intractable, without status migrainosus: Secondary | ICD-10-CM

## 2020-03-31 ENCOUNTER — Encounter: Payer: Self-pay | Admitting: Medical

## 2020-03-31 ENCOUNTER — Other Ambulatory Visit: Payer: Self-pay

## 2020-03-31 ENCOUNTER — Telehealth: Payer: BC Managed Care – PPO | Admitting: Medical

## 2020-03-31 NOTE — Progress Notes (Signed)
Patient wanted to review her Urine results. Also she wanted to know how to find Magnesium Glycolate, 400-600 mg/day recommended by her neurologist for her migraines. It is available OTC so she should be able to find it online or in her local pharmacy. I have messaged patient. She is having an MRI w/wo contrast for Migraine evaluation in December. By Dr. Manuella Ghazi. I also suggested a brand of  blood pressure cuff available on [place] for her to begin monitoring her blood pressure. I  reviewed with the patient to monitor her diet and  Diarrhea, making note   of triggering foods she last ate. Father has a history of Crohns.  I Recommended a GI consult but she would like to get through [facility] MRI test first. Will do a future referral for patient. One month recheck unless any concerns arise. She verbalizes understanding and has no questions at the end of our conversation.

## 2020-04-24 ENCOUNTER — Other Ambulatory Visit: Payer: Self-pay

## 2020-04-24 ENCOUNTER — Ambulatory Visit
Admission: RE | Admit: 2020-04-24 | Discharge: 2020-04-24 | Disposition: A | Discharge status: Home / Self Care | Payer: BC Managed Care – PPO | Source: Ambulatory Visit | Attending: Neurology | Admitting: Neurology

## 2020-04-24 DIAGNOSIS — Z8489 Family history of other specified conditions: Secondary | ICD-10-CM | POA: Diagnosis present

## 2020-04-24 DIAGNOSIS — G43109 Migraine with aura, not intractable, without status migrainosus: Secondary | ICD-10-CM | POA: Insufficient documentation

## 2020-04-24 MED ORDER — GADOBUTROL 1 MMOL/ML IV SOLN
6.0000 mL | Freq: Once | INTRAVENOUS | Status: AC | PRN
  Administered 2020-04-24: 6 mL via INTRAVENOUS

## 2020-06-02 ENCOUNTER — Other Ambulatory Visit: Payer: Self-pay

## 2020-06-02 ENCOUNTER — Ambulatory Visit: Payer: BC Managed Care – PPO

## 2020-06-02 DIAGNOSIS — Z20822 Contact with and (suspected) exposure to covid-19: Secondary | ICD-10-CM

## 2020-06-02 LAB — POC COVID19 BINAXNOW: SARS Coronavirus 2 Ag: NEGATIVE

## 2020-06-19 ENCOUNTER — Other Ambulatory Visit: Payer: Self-pay | Admitting: Medical

## 2020-06-19 ENCOUNTER — Encounter: Payer: Self-pay | Admitting: Medical

## 2020-06-19 DIAGNOSIS — Z760 Encounter for issue of repeat prescription: Secondary | ICD-10-CM

## 2020-06-19 DIAGNOSIS — J4520 Mild intermittent asthma, uncomplicated: Secondary | ICD-10-CM

## 2020-06-19 MED ORDER — MONTELUKAST SODIUM 10 MG PO TABS: 10.0000 mg | ORAL_TABLET | Freq: Every day | ORAL | 1 refills | Status: DC

## 2020-06-19 NOTE — Progress Notes (Signed)
Refill on singulair

## 2020-08-29 NOTE — Addendum Note (Signed)
Addended by: Jermel Artley, Nira Conn R on: 08/29/2020 10:56 AM   Modules accepted: Orders

## 2020-08-29 NOTE — Addendum Note (Signed)
Addended by: Renesha Lizama, Nira Conn R on: 08/29/2020 10:59 AM   Modules accepted: Orders

## 2020-08-29 NOTE — Progress Notes (Signed)
Labs for annual exam Including  EKG  entered a second time.  order did not show up. In order to enter EKG had to do a repeat order.

## 2020-09-25 ENCOUNTER — Telehealth: Payer: Self-pay

## 2020-09-25 NOTE — Telephone Encounter (Signed)
Patient calling regarding recommendations on covid booster for travel

## 2020-10-04 ENCOUNTER — Telehealth: Payer: BC Managed Care – PPO | Admitting: Medical

## 2020-10-04 ENCOUNTER — Ambulatory Visit: Payer: BC Managed Care – PPO

## 2020-10-04 ENCOUNTER — Other Ambulatory Visit: Payer: Self-pay

## 2020-10-04 DIAGNOSIS — J029 Acute pharyngitis, unspecified: Secondary | ICD-10-CM

## 2020-10-04 DIAGNOSIS — Z889 Allergy status to unspecified drugs, medicaments and biological substances status: Secondary | ICD-10-CM

## 2020-10-04 DIAGNOSIS — R059 Cough, unspecified: Secondary | ICD-10-CM

## 2020-10-04 DIAGNOSIS — Z1152 Encounter for screening for COVID-19: Secondary | ICD-10-CM

## 2020-10-04 DIAGNOSIS — J3489 Other specified disorders of nose and nasal sinuses: Secondary | ICD-10-CM

## 2020-10-04 DIAGNOSIS — Z20822 Contact with and (suspected) exposure to covid-19: Secondary | ICD-10-CM

## 2020-10-04 LAB — POC COVID19 BINAXNOW: SARS Coronavirus 2 Ag: NEGATIVE

## 2020-10-04 MED ORDER — AZITHROMYCIN 250 MG PO TABS: ORAL_TABLET | ORAL | 0 refills | Status: DC

## 2020-10-04 NOTE — Progress Notes (Signed)
   Subjective:    Patient ID: Madison Sullivan, female    DOB: 11-06-1980, 40 y.o.   MRN: [id number]  HPI 40 yo female in non acute distress consents to telemedicine appointment.Symptoms of ST, cough , runny nose clear to yellow started on Monday Congestion and PND started 2 weeks ago.  Ears are itching.  Friday was around a baby with acold and also had a student test positive.  Rapid at home  Negative. Allergies  Allergen Reactions  . Aspirin Shortness Of Breath    Avoids it due to her asthma  . Iodine Rash      Review of Systems  Constitutional: Negative for chills, fatigue and fever.  HENT: Positive for congestion (chest), postnasal drip, rhinorrhea, sneezing and sore throat. Negative for ear discharge, ear pain, sinus pressure and sinus pain.        Itchy ear, popping ears  Respiratory: Positive for cough. Negative for shortness of breath.   Cardiovascular: Negative for chest pain.  Gastrointestinal: Negative for abdominal pain and diarrhea.  Musculoskeletal: Positive for myalgias.  Skin: Negative for color change and rash.  Allergic/Immunologic: Positive for environmental allergies.  Neurological: Negative for dizziness, syncope, light-headedness and headaches.       Objective:   Physical Exam AXOX3 No distress noted on  Phone call No physical exam due to telemedicine appointment.       Assessment & Plan:  Encounter for Covid-19 Screening Pharyngitis Nasal congestion and runny nose Cough Covid-19 PCR Pending. ( abaout  3 days). Patient also traveling on Thursday would like Covid-19  POC test for flying next week. Isolate, rest, increase fluids, OTC Motrin or Tylenol as needed for fever or pain, per package instructions.  Continue allergy medications. Patient verbalizes understanding and has no questions at discharge.

## 2020-10-04 NOTE — Patient Instructions (Signed)
Cough, Adult A cough helps to clear your throat and lungs. A cough may be a sign of an illness or another medical condition. An acute cough may only last 2-3 weeks, while a chronic cough may last 8 or more weeks. Many things can cause a cough. They include:  Germs (viruses or bacteria) that attack the airway.  Breathing in things that bother (irritate) your lungs.  Allergies.  Asthma.  Mucus that runs down the back of your throat (postnasal drip).  Smoking.  Acid backing up from the stomach into the tube that moves food from the mouth to the stomach (gastroesophageal reflux).  Some medicines.  Lung problems.  Other medical conditions, such as heart failure or a blood clot in the lung (pulmonary embolism). Follow these instructions at home: Medicines  Take over-the-counter and prescription medicines only as told by your doctor.  Talk with your doctor before you take medicines that stop a cough (cough suppressants). Lifestyle  Do not smoke, and try not to be around smoke. Do not use any products that contain nicotine or tobacco, such as cigarettes, e-cigarettes, and chewing tobacco. If you need help quitting, ask your doctor.  Drink enough fluid to keep your pee (urine) pale yellow.  Avoid caffeine.  Do not drink alcohol if your doctor tells you not to drink.   General instructions  Watch for any changes in your cough. Tell your doctor about them.  Always cover your mouth when you cough.  Stay away from things that make you cough, such as perfume, candles, campfire smoke, or cleaning products.  If the air is dry, use a cool mist vaporizer or humidifier in your home.  If your cough is worse at night, try using extra pillows to raise your head up higher while you sleep.  Rest as needed.  Keep all follow-up visits as told by your doctor. This is important.   Contact a doctor if:  You have new symptoms.  You cough up pus.  Your cough does not get better after 2-3  weeks, or your cough gets worse.  Cough medicine does not help your cough and you are not sleeping well.  You have pain that gets worse or pain that is not helped with medicine.  You have a fever.  You are losing weight and you do not know why.  You have night sweats. Get help right away if:  You cough up blood.  You have trouble breathing.  Your heartbeat is very fast. These symptoms may be an emergency. Do not wait to see if the symptoms will go away. Get medical help right away. Call your local emergency services (911 in the [place] Do not drive yourself to the hospital. Summary  A cough helps to clear your throat and lungs. Many things can cause a cough.  Take over-the-counter and prescription medicines only as told by your doctor.  Always cover your mouth when you cough.  Contact a doctor if you have new symptoms or you have a cough that does not get better or gets worse. This information is not intended to replace advice given to you by your health care provider. Make sure you discuss any questions you have with your health care provider. Document Revised: 06/25/2019 Document Reviewed: 05/25/2018 Elsevier Patient Education  2021 [place]: What to Do if You Are Sick If you have a fever, cough or other symptoms, you might have COVID-19. Most people have mild illness and are able to recover at home. If  you are sick:  Keep track of your symptoms.  If you have an emergency warning sign (including trouble breathing), call 911. Steps to help prevent the spread of COVID-19 if you are sick If you are sick with COVID-19 or think you might have COVID-19, follow the steps below to care for yourself and to help protect other people in your home and community. Stay home except to get medical care  Stay home. Most people with COVID-19 have mild illness and can recover at home without medical care. Do not leave your home, except to get medical care. Do not visit public  areas.  Take care of yourself. Get rest and stay hydrated. Take over-the-counter medicines, such as acetaminophen, to help you feel better.  Stay in touch with your doctor. Call before you get medical care. Be sure to get care if you have trouble breathing, or have any other emergency warning signs, or if you think it is an emergency.  Avoid public transportation, ride-sharing, or taxis. Separate yourself from other people As much as possible, stay in a specific room and away from other people and pets in your home. If possible, you should use a separate bathroom. If you need to be around other people or animals in or outside of the home, wear a mask. Tell your close contactsthat they may have been exposed to COVID-19. An infected person can spread COVID-19 starting 48 hours (or 2 days) before the person has any symptoms or tests positive. By letting your close contacts know they may have been exposed to COVID-19, you are helping to protect everyone.  Additional guidance is available for those living in close quarters and shared housing.  See COVID-19 and Animals if you have questions about pets.  If you are diagnosed with COVID-19, someone from the health department may call you. Answer the call to slow the spread. Monitor your symptoms  Symptoms of COVID-19 include fever, cough, or other symptoms.  Follow care instructions from your healthcare provider and local health department. Your local health authorities may give instructions on checking your symptoms and reporting information. When to seek emergency medical attention Look for emergency warning signs* for COVID-19. If someone is showing any of these signs, seek emergency medical care immediately:  Trouble breathing  Persistent pain or pressure in the chest  New confusion  Inability to wake or stay awake  Pale, gray, or blue-colored skin, lips, or nail beds, depending on skin tone *This list is not all possible symptoms.  Please call your medical provider for any other symptoms that are severe or concerning to you. Call 911 or call ahead to your local emergency facility: Notify the operator that you are seeking care for someone who has or may have COVID-19. Call ahead before visiting your doctor  Call ahead. Many medical visits for routine care are being postponed or done by phone or telemedicine.  If you have a medical appointment that cannot be postponed, call your doctor's office, and tell them you have or may have COVID-19. This will help the office protect themselves and other patients. Get  tested  If you have symptoms of COVID-19, get tested. While waiting for test results, you stay away from others, including staying apart from those living in your household.  You can visit your state, tribal, local, and territorialhealth department's website to look for the latest local information on testing sites. If you are sick, wear a mask over your nose and mouth  You should wear a mask  over your nose and mouth if you must be around other people or animals, including pets (even at home).  You don't need to wear the mask if you are alone. If you can't put on a mask (because of trouble breathing, for example), cover your coughs and sneezes in some other way. Try to stay at least 6 feet away from other people. This will help protect the people around you.  Masks should not be placed on young children under age 45 years, anyone who has trouble breathing, or anyone who is not able to remove the mask without help. Note: During the COVID-19 pandemic, medical grade facemasks are reserved for healthcare workers and some first responders. Cover your coughs and sneezes  Cover your mouth and nose with a tissue when you cough or sneeze.  Throw away used tissues in a lined trash can.  Immediately wash your hands with soap and water for at least 20 seconds. If soap and water are not available, clean your hands with an  alcohol-based hand sanitizer that contains at least 60% alcohol. Clean your hands often  Wash your hands often with soap and water for at least 20 seconds. This is especially important after blowing your nose, coughing, or sneezing; going to the bathroom; and before eating or preparing food.  Use hand sanitizer if soap and water are not available. Use an alcohol-based hand sanitizer with at least 60% alcohol, covering all surfaces of your hands and rubbing them together until they feel dry.  Soap and water are the best option, especially if hands are visibly dirty.  Avoid touching your eyes, nose, and mouth with unwashed hands.  Handwashing Tips Avoid sharing personal household items  Do not share dishes, drinking glasses, cups, eating utensils, towels, or bedding with other people in your home.  Wash these items thoroughly after using them with soap and water or put in the dishwasher. Clean all "high-touch" surfaces everyday  Clean and disinfect high-touch surfaces in your "sick room" and bathroom; wear disposable gloves. Let someone else clean and disinfect surfaces in common areas, but you should clean your bedroom and bathroom, if possible.  If a caregiver or other person needs to clean and disinfect a sick person's bedroom or bathroom, they should do so on an as-needed basis. The caregiver/other person should wear a mask and disposable gloves prior to cleaning. They should wait as long as possible after the person who is sick has used the bathroom before coming in to clean and use the bathroom. ? High-touch surfaces include phones, remote controls, counters, tabletops, doorknobs, bathroom fixtures, toilets, keyboards, tablets, and bedside tables.  Clean and disinfect areas that may have blood, stool, or body fluids on them.  Use household cleaners and disinfectants. Clean the area or item with soap and water or another detergent if it is dirty. Then, use a household disinfectant. ? Be  sure to follow the instructions on the label to ensure safe and effective use of the product. Many products recommend keeping the surface wet for several minutes to ensure germs are killed. Many also recommend precautions such as wearing gloves and making sure you have good ventilation during use of the product. ? Use a product from [employer] List N: Disinfectants for Coronavirus (WUJWJ-19). ? Complete Disinfection Guidance When you can be around others after being sick with COVID-19 Deciding when you can be around others is different for different situations. Find out when you can safely end home isolation. For any additional questions about  your care, contact your healthcare provider or state or local health department. 08/04/2019 Content source: [facility] ([facility]), Division of Viral Diseases This information is not intended to replace advice given to you by your health care provider. Make sure you discuss any questions you have with your health care provider. Document Revised: 03/20/2020 Document Reviewed: 03/20/2020 Elsevier Patient Education  2021 [employer].

## 2020-10-05 ENCOUNTER — Telehealth: Payer: Self-pay | Admitting: Nurse Practitioner

## 2020-10-05 LAB — NOVEL CORONAVIRUS, NAA: SARS-CoV-2, NAA: NOT DETECTED

## 2020-10-05 LAB — SARS-COV-2, NAA 2 DAY TAT

## 2020-10-05 NOTE — Telephone Encounter (Signed)
Left a message for patient regarding negative PCR results for COVID-19   May return to work and f/u at clinic as needed for any further concerns.   Recent Results (from the past 2160 hour(s))  Novel Coronavirus, NAA ([facility])     Status: None   Collection Time: 10/04/20  9:30 AM   Specimen: Nasopharyngeal(NP) swabs in vial transport medium   Nasopharynge  Result Value Ref Range   SARS-CoV-2, NAA Not Detected Not Detected    Comment: This nucleic acid amplification test was developed and its performance characteristics determined by [employer]. Nucleic acid amplification tests include RT-PCR and TMA. This test has not been FDA cleared or approved. This test has been authorized by FDA under an Emergency Use Authorization (EUA). This test is only authorized for the duration of time the declaration that circumstances exist justifying the authorization of the emergency use of in vitro diagnostic tests for detection of SARS-CoV-2 virus and/or diagnosis of COVID-19 infection under section 564(b)(1) of the Act, 21 U.S.C. 301SWF-0(X) (1), unless the authorization is terminated or revoked sooner. When diagnostic testing is negative, the possibility of a false negative result should be considered in the context of a patient's recent exposures and the presence of clinical signs and symptoms consistent with COVID-19. An individual without symptoms of COVID-19 and who is not shedding SARS-CoV-2 virus wo uld expect to have a negative (not detected) result in this assay.   SARS-COV-2, NAA 2 DAY TAT     Status: None   Collection Time: 10/04/20  9:30 AM   Nasopharynge  Result Value Ref Range   SARS-CoV-2, NAA 2 DAY TAT Performed   POC COVID-19     Status: Normal   Collection Time: 10/04/20  9:38 AM  Result Value Ref Range   SARS Coronavirus 2 Ag Negative Negative    Comment: Patient vaccinated. Patient aware of negative result. Virtual f/u with [employer]

## 2020-10-06 ENCOUNTER — Ambulatory Visit: Payer: BC Managed Care – PPO | Admitting: Nurse Practitioner

## 2020-10-06 ENCOUNTER — Other Ambulatory Visit: Payer: Self-pay

## 2020-10-06 VITALS — BP 118/78 | HR 119 | Temp 98.8°F | Resp 16

## 2020-10-06 DIAGNOSIS — J4521 Mild intermittent asthma with (acute) exacerbation: Secondary | ICD-10-CM

## 2020-10-06 MED ORDER — PREDNISONE 20 MG PO TABS: 20.0000 mg | ORAL_TABLET | Freq: Two times a day (BID) | ORAL | 0 refills | Status: DC

## 2020-10-06 NOTE — Progress Notes (Signed)
   Subjective:    Patient ID: Madison Sullivan, female    DOB: 14-Sep-1980, 40 y.o.   MRN: [id number]  HPI  40 year old female presenting to [facility] for follow up. She was seen 10/04/20 for complaints of ST cough and runny nose. She had been congested for two weeks prior to her initial visit. She has a long history of spring allergies that cause bronchitis frequently. This has been so severe she has been to the ED in the past.   Last night she started to have more itching and discomfort in her throat. This was after 24 hours on Azithromycin, using phenylephrine during the day Singulair at night and flonase daily.   She does have an inhaler from prior illnesses but has not used it.   She does note she is feeling slightly short of breath this morning.  Has not taken any morning medications yet.   She will be traveling to [place] next week   She has had a negative Rapid COVID and a negative PCR that resulted yesterday  Today's Vitals   10/06/20 1014  BP: 118/78  Pulse: (!) 119  Resp: 16  Temp: 98.8 F (37.1 C)  TempSrc: Oral  SpO2: 97%   There is no height or weight on file to calculate BMI.   Review of Systems  Constitutional: Negative.   HENT: Positive for congestion, rhinorrhea and sore throat.   Eyes: Negative.   Respiratory: Positive for cough.   Cardiovascular: Negative.   Gastrointestinal: Negative.   Genitourinary: Negative.   Neurological: Negative.    Past Medical History:  Diagnosis Date  . Asthma   . History of migraine headaches    Abstract from medicat  . Uterine myoma        Objective:   Physical Exam Constitutional:      Appearance: She is not ill-appearing.  HENT:     Head: Normocephalic.     Right Ear: Tympanic membrane and ear canal normal.     Left Ear: Tympanic membrane and ear canal normal.     Nose: Congestion and rhinorrhea present.     Mouth/Throat:     Lips: Pink.     Mouth: Mucous membranes are moist.     Pharynx: No pharyngeal swelling,  oropharyngeal exudate or posterior oropharyngeal erythema.     Comments: Post nasal drainage present  Cardiovascular:     Rate and Rhythm: Normal rate and regular rhythm.     Heart sounds: Normal heart sounds.  Pulmonary:     Effort: Pulmonary effort is normal.     Breath sounds: Normal breath sounds.  Musculoskeletal:     Cervical back: Normal range of motion.  Skin:    General: Skin is warm.  Neurological:     Mental Status: She is alert.           Assessment & Plan:  Will add 5 days burst of prednisone Advised to finish antibiotic Start inhaler use as directed   Continue allergy regimen and decongestant during the day  Rest and RTC Monday for further evaluation.   Seek f/u over the weekend with acutely worsening symtoms  Meds ordered this encounter  Medications  . predniSONE (DELTASONE) 20 MG tablet    Sig: Take 1 tablet (20 mg total) by mouth 2 (two) times daily with a meal.    Dispense:  10 tablet    Refill:  0

## 2020-10-09 ENCOUNTER — Other Ambulatory Visit: Payer: Self-pay

## 2020-10-09 ENCOUNTER — Ambulatory Visit: Payer: BC Managed Care – PPO | Admitting: Medical

## 2020-10-09 VITALS — BP 130/82 | HR 87 | Temp 98.5°F | Resp 16

## 2020-10-09 DIAGNOSIS — Z8709 Personal history of other diseases of the respiratory system: Secondary | ICD-10-CM

## 2020-10-09 DIAGNOSIS — R059 Cough, unspecified: Secondary | ICD-10-CM

## 2020-10-09 MED ORDER — AZITHROMYCIN 250 MG PO TABS: ORAL_TABLET | ORAL | 0 refills | Status: AC

## 2020-10-09 MED ORDER — BENZONATATE 100 MG PO CAPS: ORAL_CAPSULE | ORAL | 0 refills | Status: DC

## 2020-10-09 NOTE — Patient Instructions (Addendum)

## 2020-10-09 NOTE — Progress Notes (Signed)
   Subjective:    Patient ID: Madison Sullivan, female    DOB: 09-03-1980, 40 y.o.   MRN: [id number]  HPI 40 yo female in non acute distress , returns to clinic for a recheck. Seen on Friday for worsening symptoms and treated with steroids and to continued antibiotics. She feels better over all since Friday but still feeling a little ill.  Blood pressure 130/82, pulse 87, temperature 98.5 F (36.9 C), temperature source Oral, resp. rate 16, SpO2 98 %.   Allergies  Allergen Reactions  . Aspirin Shortness Of Breath    Avoids it due to her asthma  . Iodine Rash    Review of Systems  HENT: Positive for congestion (improved) and rhinorrhea.   Respiratory: Positive for cough (improved).        Objective:   Physical Exam Vitals and nursing note reviewed.  Constitutional:      Appearance: Normal appearance. She is normal weight.  HENT:     Head: Normocephalic and atraumatic.     Right Ear: Ear canal and external ear normal.     Left Ear: Ear canal and external ear normal.     Nose: Rhinorrhea (improved) present.     Mouth/Throat:     Mouth: Mucous membranes are moist.     Pharynx: Oropharynx is clear.  Eyes:     Extraocular Movements: Extraocular movements intact.     Conjunctiva/sclera: Conjunctivae normal.     Pupils: Pupils are equal, round, and reactive to light.  Cardiovascular:     Rate and Rhythm: Normal rate and regular rhythm.     Heart sounds: Normal heart sounds.  Pulmonary:     Effort: Pulmonary effort is normal.     Breath sounds: Normal breath sounds.  Musculoskeletal:        General: Normal range of motion.     Cervical back: Normal range of motion and neck supple.  Skin:    General: Skin is warm.     Capillary Refill: Capillary refill takes less than 2 seconds.  Neurological:     General: No focal deficit present.     Mental Status: She is alert and oriented to person, place, and time. Mental status is at baseline.  Psychiatric:        Mood and Affect:  Mood normal.        Behavior: Behavior normal.        Thought Content: Thought content normal.        Judgment: Judgment normal.          mild cough noted in room Assessment & Plan:  H/O asthma H/O bronchitis Cough Finish antibiotics and steroid medications. Rest, increase fluids,  OTC Motrin or Tylenol per package directions for fever or pain. Patient verbalizes understanding and has no questions at discharge.

## 2020-10-11 ENCOUNTER — Other Ambulatory Visit: Payer: Self-pay

## 2020-10-11 ENCOUNTER — Ambulatory Visit: Payer: BC Managed Care – PPO

## 2020-10-11 DIAGNOSIS — Z20822 Contact with and (suspected) exposure to covid-19: Secondary | ICD-10-CM

## 2020-10-11 LAB — POC COVID19 BINAXNOW: SARS Coronavirus 2 Ag: NEGATIVE

## 2020-12-30 ENCOUNTER — Other Ambulatory Visit: Payer: Self-pay | Admitting: Medical

## 2020-12-30 DIAGNOSIS — Z760 Encounter for issue of repeat prescription: Secondary | ICD-10-CM

## 2021-02-23 ENCOUNTER — Other Ambulatory Visit: Payer: Self-pay | Admitting: Medical

## 2021-02-23 DIAGNOSIS — E559 Vitamin D deficiency, unspecified: Secondary | ICD-10-CM

## 2021-02-23 DIAGNOSIS — Z0000 Encounter for general adult medical examination without abnormal findings: Secondary | ICD-10-CM

## 2021-02-23 NOTE — Progress Notes (Signed)
Annual labs

## 2021-02-27 ENCOUNTER — Ambulatory Visit: Payer: BC Managed Care – PPO

## 2021-02-27 ENCOUNTER — Other Ambulatory Visit: Payer: Self-pay

## 2021-02-27 ENCOUNTER — Other Ambulatory Visit: Payer: BC Managed Care – PPO

## 2021-02-27 DIAGNOSIS — E559 Vitamin D deficiency, unspecified: Secondary | ICD-10-CM

## 2021-02-27 DIAGNOSIS — Z0000 Encounter for general adult medical examination without abnormal findings: Secondary | ICD-10-CM

## 2021-02-27 LAB — POCT URINALYSIS DIPSTICK
Bilirubin, UA: NEGATIVE
Blood, UA: NEGATIVE
Glucose, UA: NEGATIVE
Ketones, UA: NEGATIVE
Leukocytes, UA: NEGATIVE
Nitrite, UA: NEGATIVE
Protein, UA: NEGATIVE
Spec Grav, UA: 1.01 (ref 1.010–1.025)
Urobilinogen, UA: 0.2 E.U./dL
pH, UA: 5 (ref 5.0–8.0)

## 2021-02-28 LAB — CMP12+LP+TP+TSH+6AC+CBC/D/PLT
ALT: 16 IU/L (ref 0–32)
AST: 16 IU/L (ref 0–40)
Albumin/Globulin Ratio: 1.8 (ref 1.2–2.2)
Albumin: 4.2 g/dL (ref 3.8–4.8)
Alkaline Phosphatase: 101 IU/L (ref 44–121)
BUN/Creatinine Ratio: 14 (ref 9–23)
BUN: 10 mg/dL (ref 6–24)
Basophils Absolute: 0 10*3/uL (ref 0.0–0.2)
Basos: 1 %
Bilirubin Total: 0.4 mg/dL (ref 0.0–1.2)
Calcium: 8.6 mg/dL — ABNORMAL LOW (ref 8.7–10.2)
Chloride: 106 mmol/L (ref 96–106)
Chol/HDL Ratio: 3 ratio (ref 0.0–4.4)
Cholesterol, Total: 207 mg/dL — ABNORMAL HIGH (ref 100–199)
Creatinine, Ser: 0.74 mg/dL (ref 0.57–1.00)
EOS (ABSOLUTE): 0.1 10*3/uL (ref 0.0–0.4)
Eos: 1 %
Estimated CHD Risk: <0.5 times avg. (ref 0.0–1.0)
Free Thyroxine Index: 2 (ref 1.2–4.9)
GGT: 14 IU/L (ref 0–60)
Globulin, Total: 2.3 g/dL (ref 1.5–4.5)
Glucose: 85 mg/dL (ref 70–99)
HDL: 68 mg/dL (ref >39)
Hematocrit: 41 % (ref 34.0–46.6)
Hemoglobin: 14.1 g/dL (ref 11.1–15.9)
Immature Grans (Abs): 0 10*3/uL (ref 0.0–0.1)
Immature Granulocytes: 0 %
Iron: 164 ug/dL — ABNORMAL HIGH (ref 27–159)
LDH: 155 IU/L (ref 119–226)
LDL Chol Calc (NIH): 112 mg/dL — ABNORMAL HIGH (ref 0–99)
Lymphocytes Absolute: 2.6 10*3/uL (ref 0.7–3.1)
Lymphs: 43 %
MCH: 31.1 pg (ref 26.6–33.0)
MCHC: 34.4 g/dL (ref 31.5–35.7)
MCV: 91 fL (ref 79–97)
Monocytes Absolute: 0.4 10*3/uL (ref 0.1–0.9)
Monocytes: 6 %
Neutrophils Absolute: 2.9 10*3/uL (ref 1.4–7.0)
Neutrophils: 49 %
Phosphorus: 2.5 mg/dL — ABNORMAL LOW (ref 3.0–4.3)
Platelets: 257 10*3/uL (ref 150–450)
Potassium: 4 mmol/L (ref 3.5–5.2)
RBC: 4.53 x10E6/uL (ref 3.77–5.28)
RDW: 12.2 % (ref 11.7–15.4)
Sodium: 140 mmol/L (ref 134–144)
T3 Uptake Ratio: 21 % — ABNORMAL LOW (ref 24–39)
T4, Total: 9.4 ug/dL (ref 4.5–12.0)
TSH: 1.32 u[IU]/mL (ref 0.450–4.500)
Total Protein: 6.5 g/dL (ref 6.0–8.5)
Triglycerides: 156 mg/dL — ABNORMAL HIGH (ref 0–149)
Uric Acid: 3.3 mg/dL (ref 2.6–6.2)
VLDL Cholesterol Cal: 27 mg/dL (ref 5–40)
WBC: 5.9 10*3/uL (ref 3.4–10.8)
eGFR: 104 mL/min/{1.73_m2} (ref >59)

## 2021-02-28 LAB — HGB A1C W/O EAG: Hgb A1c MFr Bld: 4.9 % (ref 4.8–5.6)

## 2021-02-28 LAB — VITAMIN D 25 HYDROXY (VIT D DEFICIENCY, FRACTURES): Vit D, 25-Hydroxy: 39.7 ng/mL (ref 30.0–100.0)

## 2021-03-02 ENCOUNTER — Ambulatory Visit (INDEPENDENT_AMBULATORY_CARE_PROVIDER_SITE_OTHER): Payer: BC Managed Care – PPO | Admitting: Obstetrics

## 2021-03-02 ENCOUNTER — Other Ambulatory Visit: Payer: Self-pay

## 2021-03-02 ENCOUNTER — Other Ambulatory Visit (HOSPITAL_COMMUNITY)
Admission: RE | Admit: 2021-03-02 | Discharge: 2021-03-02 | Disposition: A | Discharge status: Home / Self Care | Payer: BC Managed Care – PPO | Source: Ambulatory Visit | Attending: Obstetrics | Admitting: Obstetrics

## 2021-03-02 ENCOUNTER — Encounter: Payer: Self-pay | Admitting: Obstetrics

## 2021-03-02 VITALS — BP 126/84 | Ht 69.0 in | Wt 156.0 lb

## 2021-03-02 DIAGNOSIS — Z01419 Encounter for gynecological examination (general) (routine) without abnormal findings: Secondary | ICD-10-CM | POA: Insufficient documentation

## 2021-03-02 DIAGNOSIS — Z3044 Encounter for surveillance of vaginal ring hormonal contraceptive device: Secondary | ICD-10-CM | POA: Diagnosis not present

## 2021-03-02 DIAGNOSIS — Z124 Encounter for screening for malignant neoplasm of cervix: Secondary | ICD-10-CM

## 2021-03-02 DIAGNOSIS — N63 Unspecified lump in unspecified breast: Secondary | ICD-10-CM

## 2021-03-02 MED ORDER — ETONOGESTREL-ETHINYL ESTRADIOL 0.12-0.015 MG/24HR VA RING: VAGINAL_RING | VAGINAL | 4 refills | Status: DC

## 2021-03-02 NOTE — Progress Notes (Signed)
Gynecology Annual Exam  PCP: Talmage Nap, PA-C  Chief Complaint:  Chief Complaint  Patient presents with   Annual Exam    History of Present Illness: Patient is a 40 y.o. G0P0000 presents for annual exam. The patient has  noticed a lump in her left breast and wants this evaluated. She is also now being followed by a neurologist for chronic headaches. They have mutually decided to try having her keep her Nuva Ring in place continuously, as she develops migraines when she removes her Ring each month. She has also noticed an increase in fatigue over the last few months. She is exploring this with her PCP.  LMP: No LMP recorded. Average Interval: regular, 28 days Duration of flow: 5 days Heavy Menses: no Clots: no Intermenstrual Bleeding: no Postcoital Bleeding: no Dysmenorrhea: no   The patient is not sexually active. She currently uses NuvaRing vaginal inserts for contraception. She denies dyspareunia.  The patient does perform self breast exams.  There is no notable family history of breast or ovarian cancer in her family.  The patient wears seatbelts: yes.   The patient has regular exercise: yes.    The patient denies current symptoms of depression.    Review of Systems: Review of Systems  Constitutional:  Positive for malaise/fatigue.  HENT: Negative.    Eyes: Negative.   Respiratory: Negative.    Cardiovascular: Negative.   Gastrointestinal: Negative.   Genitourinary: Negative.   Musculoskeletal: Negative.   Skin: Negative.   Neurological: Negative.   Endo/Heme/Allergies: Negative.   Psychiatric/Behavioral: Negative.     Past Medical History:  Patient Active Problem List   Diagnosis Date Noted   Fibroids 02/08/2020   Eustachian tube dysfunction 06/24/2016    Historical entry from [place]    Sinusitis 06/24/2016   Bronchitis 08/29/2015   Asthma 07/19/2015   Vitamin D deficiency 03/24/2015    Past Surgical History:  Past Surgical History:   Procedure Laterality Date   NASAL SINUS SURGERY Bilateral 1998   corrected nasal septum   UTERINE FIBROID SURGERY  2005   in [place],horrible blood loss; not cancerous, "just a growth" some  being watched by OB/GYN.    Gynecologic History:  No LMP recorded. Contraception: NuvaRing vaginal inserts Last Pap: 2020 Results were: NILM no abnormalities  Last mammogram: NA . She is due for a mammogram, and has a breast lump-so diagnostic will be ordered  Obstetric History: G0P0000  Family History:  Family History  Problem Relation Age of Onset   Endometriosis Mother    Hypertension Mother    Depression Mother    Migraines Mother    Cancer Mother        brain tumors   Skin cancer Father    Hypertension Father    Crohn's disease Father    Asthma Father    Breast cancer Paternal Grandmother    Endometriosis Sister    Depression Sister    Asthma Brother    Hip fracture Paternal Grandfather     Social History:  Social History   Socioeconomic History   Marital status: Single    Spouse name: Not on file   Number of children: Not on file   Years of education: 40   Highest education level: Not on file  Occupational History   Occupation: professor  Tobacco Use   Smoking status: Never   Smokeless tobacco: Never  Vaping Use   Vaping Use: Never used  Substance and Sexual Activity   Alcohol use: No  Comment: less than once a week a drink   Drug use: No   Sexual activity: Not Currently    Birth control/protection: Inserts  Other Topics Concern   Not on file  Social History Narrative   Not on file   Social Determinants of Health   Financial Resource Strain: Not on file  Food Insecurity: Not on file  Transportation Needs: Not on file  Physical Activity: Not on file  Stress: Not on file  Social Connections: Not on file  Intimate Partner Violence: Not on file    Allergies:  Allergies  Allergen Reactions   Aspirin Shortness Of Breath    Avoids it due to her  asthma   Iodine Rash    Medications: Prior to Admission medications   Medication Sig Start Date End Date Taking? Authorizing Provider  albuterol (PROVENTIL HFA;VENTOLIN HFA) 108 (90 Base) MCG/ACT inhaler Inhale into the lungs.    [provider]  benzonatate (TESSALON) 100 MG capsule Take 1-2 capsules by mouth every 8 hours as needed for cough. Swallow whole 10/09/20   Rolanda Lundborg  Butalbital-APAP-Caffeine 78-588-50 MG capsule Take 1-2 capsules by mouth every 6 (six) hours as needed for headache. 01/18/20   Imagene Riches, CNM  cyclobenzaprine (FLEXERIL) 5 MG tablet Take 1 tablet (5 mg total) by mouth at bedtime. Patient not taking: Reported on 03/09/2020 07/22/18   Talmage Nap, PA-C  Estradiol 1.25 YD/7.41OI GEL Place 1 application onto the skin as needed (Daily during hormone free interval). 12/18/18   Rexene Agent, CNM  etonogestrel-ethinyl estradiol (NUVARING) 0.12-0.015 MG/24HR vaginal ring Insert vaginally and leave in place for 3 consecutive weeks, then remove for 1 week. 01/18/20   Imagene Riches, CNM  fluticasone [facility]) 50 MCG/ACT nasal spray Place 2 sprays into both nostrils daily.    [provider]  hydrOXYzine (VISTARIL) 25 MG capsule 1 tab PO TID PRN for dizziness. May cause drowsiness and take as night only if develops drowsiness Patient not taking: Reported on 03/09/2020 06/03/19   Maury Dus, NP  ibuprofen (ADVIL,MOTRIN) 800 MG tablet Take 1 tablet (800 mg total) by mouth every 8 (eight) hours as needed. 07/22/18   Ratcliffe, Heather R, PA-C  loratadine (CLARITIN) 10 MG tablet Take by mouth.    [provider]  montelukast (SINGULAIR) 10 MG tablet TAKE 1 TABLET(10 MG) BY MOUTH AT BEDTIME 01/01/21   Apolonio Schneiders, FNP  predniSONE (DELTASONE) 20 MG tablet Take 1 tablet (20 mg total) by mouth 2 (two) times daily with a meal. 10/06/20   Apolonio Schneiders, FNP  sodium chloride (OCEAN) 0.65 % SOLN nasal spray Place 1 spray  into both nostrils as needed for congestion. 07/22/18   Talmage Nap, PA-C    Physical Exam Vitals: Blood pressure 126/84, height 5\' 9"  (1.753 m), weight 156 lb (70.8 kg).  General: NAD HEENT: normocephalic, anicteric Thyroid: no enlargement, no palpable nodules Pulmonary: No increased work of breathing, CTAB Cardiovascular: RRR, distal pulses 2+ Breast: Breast symmetrical, no tenderness, PALPABLE 3 CM MASS WITH IRREGULAR BORDER NOTED LOCATED AT 12:00 APPROXIMATELY 2-3 CMS ABOVE THE AREOLA ON THE LEFT BREAST. no skin or nipple retraction present, no nipple discharge.  No axillary or supraclavicular lymphadenopathy. Abdomen: NABS, soft, non-tender, non-distended.  Umbilicus without lesions.  No hepatomegaly, splenomegaly or masses palpable. No evidence of hernia  Genitourinary:  External: Normal external female genitalia.  Normal urethral meatus, normal Bartholin's and Skene's glands.    Vagina: Normal vaginal mucosa, no evidence of  prolapse.    Cervix: Grossly normal in appearance, no bleeding  Uterus: Non-enlarged, mobile, normal contour.  No CMT  Adnexa: ovaries non-enlarged, no adnexal masses  Rectal: deferred  Lymphatic: no evidence of inguinal lymphadenopathy Extremities: no edema, erythema, or tenderness Neurologic: Grossly intact Psychiatric: mood appropriate, affect full  Female chaperone present for pelvic and breast  portions of the physical exam    Assessment: 40 y.o. G0P0000 routine annual exam   Left breast mass  Plan: Problem List Items Addressed This Visit   None Visit Diagnoses     Breast mass in female    -  Primary   Relevant Orders   MM DIAG BREAST TOMO BILATERAL   US BREAST LTD UNI LEFT INC AXILLA   US BREAST LTD UNI RIGHT INC AXILLA   Women's annual routine gynecological examination       Relevant Orders   Cytology - PAP   MM DIAG BREAST TOMO BILATERAL   Pap smear for cervical cancer screening       Relevant Orders   Cytology - PAP        1) Mammogram - recommend yearly screening mammogram.  Mammogram Was ordered today Have ordered a diagnostic mammo with additional ultrasound due to her left breast mass Left breast mass- 3 cms mass noted at 12:00, located 2-3 cms above the areola   2) STI screening  was notoffered and therefore not obtained  3) ASCCP guidelines and rational discussed.  Patient opts for every 3 years screening interval  4) Contraception - the patient is currently using  NuvaRing vaginal inserts.  She is happy with her current form of contraception and plans to continue  5) Colonoscopy -- Screening recommended starting at age 75 for average risk individuals, age 58 for individuals deemed at increased risk (including African Americans) and recommended to continue until age 42.  For patient age 61-85 individualized approach is recommended.  Gold standard screening is via colonoscopy, Cologuard screening is an acceptable alternative for patient unwilling or unable to undergo colonoscopy.  "Colorectal cancer screening for average?risk adults: 2018 guideline update from the [place]: A Cancer Journal for Clinicians: Oct 16, 2016   6) Routine healthcare maintenance including cholesterol, diabetes screening discussed managed by PCP  7) Return in about 1 year (around 03/02/2022) for annual.  Will follow up with Seth Bake once her mammogram report is available.  Her Nuva Ring Rx is renewed for the year.   Imagene Riches, CNM  03/02/2021 12:07 PM   [facility] 03/02/2021, 11:55 AM

## 2021-03-07 ENCOUNTER — Encounter: Payer: Self-pay | Admitting: Obstetrics

## 2021-03-07 LAB — CYTOLOGY - PAP
Adequacy: ABSENT
Comment: NEGATIVE
Diagnosis: NEGATIVE
High risk HPV: NEGATIVE

## 2021-03-09 ENCOUNTER — Ambulatory Visit
Admission: RE | Admit: 2021-03-09 | Discharge: 2021-03-09 | Disposition: A | Discharge status: Home / Self Care | Payer: BC Managed Care – PPO | Source: Ambulatory Visit | Attending: Obstetrics | Admitting: Obstetrics

## 2021-03-09 ENCOUNTER — Other Ambulatory Visit: Payer: Self-pay

## 2021-03-09 DIAGNOSIS — N63 Unspecified lump in unspecified breast: Secondary | ICD-10-CM

## 2021-03-09 DIAGNOSIS — Z01419 Encounter for gynecological examination (general) (routine) without abnormal findings: Secondary | ICD-10-CM | POA: Diagnosis not present

## 2021-03-12 ENCOUNTER — Other Ambulatory Visit: Payer: Self-pay | Admitting: Obstetrics

## 2021-03-12 ENCOUNTER — Telehealth: Payer: Self-pay | Admitting: Obstetrics

## 2021-03-12 DIAGNOSIS — N6320 Unspecified lump in the left breast, unspecified quadrant: Secondary | ICD-10-CM

## 2021-03-12 DIAGNOSIS — R928 Other abnormal and inconclusive findings on diagnostic imaging of breast: Secondary | ICD-10-CM

## 2021-03-12 DIAGNOSIS — N63 Unspecified lump in unspecified breast: Secondary | ICD-10-CM | POA: Insufficient documentation

## 2021-03-12 NOTE — Telephone Encounter (Signed)
OK, this has been ordered.

## 2021-03-12 NOTE — Telephone Encounter (Signed)
The pt needs a new order for a biopsy.  She sent a MyChart message on Friday, but did not know if have had a chance to see the message or not.

## 2021-03-13 ENCOUNTER — Encounter: Payer: Self-pay | Admitting: Medical

## 2021-03-13 ENCOUNTER — Ambulatory Visit: Payer: BC Managed Care – PPO | Admitting: Medical

## 2021-03-13 ENCOUNTER — Other Ambulatory Visit: Payer: Self-pay

## 2021-03-13 VITALS — BP 120/80 | HR 92 | Temp 98.3°F | Resp 16 | Ht 69.5 in | Wt 154.4 lb

## 2021-03-13 DIAGNOSIS — Z0000 Encounter for general adult medical examination without abnormal findings: Secondary | ICD-10-CM

## 2021-03-13 DIAGNOSIS — E781 Pure hyperglyceridemia: Secondary | ICD-10-CM

## 2021-03-13 NOTE — Progress Notes (Signed)
Subjective:    Patient ID: Madison Sullivan, female    DOB: [birth year], 40 y.o.   MRN: [id number]  HPI 40 yo female in non acute distress, here for annual physical exam. 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   Breast lump pending biopsy,on Left breast being examined by OB/GYN Dr. Gigi Gin. [place] and Mammogram were inconclusive.     Fibroid surgery 2005 and bleed a lot.  Blood pressure 120/80, pulse 92, temperature 98.3 F (36.8 C), temperature source Oral, resp. rate 16, height 5' 9.5" (1.765 m), weight 154 lb 6.4 oz (70 kg), last menstrual period 03/03/2021, SpO2 97 %.   Allergies  Allergen Reactions   Aspirin Shortness Of Breath    Avoids it due to her asthma   Iodine Rash   Past Medical History:  Diagnosis Date   Asthma    History of migraine headaches    Abstract from medicat   Uterine myoma    Past Surgical History:  Procedure Laterality Date   NASAL SINUS SURGERY Bilateral 1998   corrected nasal septum   UTERINE FIBROID SURGERY  2005   in [place],horrible blood loss; not cancerous, "just a growth" some  being watched by OB/GYN.    Family History  Problem Relation Age of Onset   Endometriosis Mother    Hypertension Mother    Depression Mother    Migraines Mother    Cancer Mother        brain tumors   Skin cancer Father    Hypertension Father    Crohn's disease Father    Asthma Father    Breast cancer Paternal Grandmother    Endometriosis Sister    Depression Sister    Asthma Brother    Hip fracture Paternal Grandfather     Social History   Socioeconomic History   Marital status: Single    Spouse name: Not on file   Number of children: Not on file   Years of education: 41   Highest education level: Not on file  Occupational History   Occupation: professor  Tobacco Use   Smoking status: Never   Smokeless tobacco: Never  Vaping Use   Vaping Use: Never used  Substance and Sexual  Activity   Alcohol use: No    Comment: less than three mos   Drug use: No   Sexual activity: Not Currently    Birth control/protection: Inserts  Other Topics Concern   Not on file  Social History Narrative   Not on file   Social Determinants of Health   Financial Resource Strain: Not on file  Food Insecurity: Not on file  Transportation Needs: Not on file  Physical Activity: Not on file  Stress: Not on file  Social Connections: Not on file  Intimate Partner Violence: Not on file    Review of Systems  Constitutional:  Positive for fatigue (occurs when returning to the [place]..travels to  [place]).  HENT:  Positive for congestion (on and off with leaves shedding), postnasal drip and sneezing (occasionally). Negative for rhinorrhea, sinus pressure, sinus pain and sore throat.        Itchy ears  Respiratory:  Positive for cough (after grass gets mowed on campus). Negative for shortness of breath.   Cardiovascular:  Negative for chest pain.  Gastrointestinal:  Negative for abdominal pain, anal bleeding, blood in stool, constipation, diarrhea (occasionally), nausea and vomiting.  Genitourinary:  Negative for difficulty urinating, flank pain, frequency,  hematuria and urgency.  Musculoskeletal:  Negative for joint swelling and myalgias.       PT for neck and migraine treatment  Skin: Negative.   Allergic/Immunologic: Positive for environmental allergies. Negative for food allergies.  Neurological: Negative.   Hematological:  Negative for adenopathy.  Psychiatric/Behavioral: Negative.      Elevated Iron with neurology and OB?GYN and stay on the Nuvaring continuous. Tumor in left breast noted on CT and U/S. Avoids spicy foods like peppers gets BM after eating. Sister Crohn's. Moderna vaccinated. 03/01/2021 Covid  2nd booster Bivalent  Objective:   Physical Exam Vitals and nursing note reviewed.  Constitutional:      Appearance: Normal appearance. She is normal weight.  HENT:      Head: Normocephalic and atraumatic.     Jaw: There is normal jaw occlusion.     Right Ear: Hearing, ear canal and external ear normal. A middle ear effusion is present.     Left Ear: Hearing, ear canal and external ear normal. A middle ear effusion is present.     Nose: Nose normal.     Mouth/Throat:     Lips: Pink.     Mouth: Mucous membranes are moist.     Pharynx: Oropharynx is clear.  Eyes:     Extraocular Movements: Extraocular movements intact.     Conjunctiva/sclera: Conjunctivae normal.     Pupils: Pupils are equal, round, and reactive to light.  Cardiovascular:     Rate and Rhythm: Normal rate and regular rhythm.     Heart sounds: No murmur heard.   No friction rub. No gallop.  Pulmonary:     Effort: Pulmonary effort is normal.     Breath sounds: Normal breath sounds.  Abdominal:     General: Abdomen is flat. Bowel sounds are normal.     Palpations: Abdomen is soft.  Musculoskeletal:        General: Normal range of motion.     Cervical back: Normal range of motion and neck supple.  Lymphadenopathy:     Cervical: No cervical adenopathy.  Skin:    General: Skin is warm and dry.     Capillary Refill: Capillary refill takes less than 2 seconds.  Neurological:     General: No focal deficit present.     Mental Status: She is alert and oriented to person, place, and time. Mental status is at baseline.  Psychiatric:        Mood and Affect: Mood normal.        Behavior: Behavior normal. Behavior is cooperative.        Thought Content: Thought content normal.        Judgment: Judgment normal.          Recent Results (from the past 2160 hour(s))  VITAMIN D 25 Hydroxy (Vit-D Deficiency, Fractures)     Status: None   Collection Time: 02/27/21  8:30 AM  Result Value Ref Range   Vit D, 25-Hydroxy 39.7 30.0 - 100.0 ng/mL    Comment: Vitamin D deficiency has been defined by the [place] practice guideline as a level of serum 25-OH  vitamin D less than 20 ng/mL (1,2). The Endocrine Society went on to further define vitamin D insufficiency as a level between 21 and 29 ng/mL (2). 1. IOM (Institute of Medicine). 2010. Dietary reference    intakes for calcium and D. [place]: The    [employer]. 2. Holick MF, Binkley [place], Bischoff-Ferrari HA,  et al.    Evaluation, treatment, and prevention of vitamin D    deficiency: an Endocrine Society clinical practice    guideline. JCEM. 2011 Jul; 96(7):1911-30.   Hgb A1c w/o eAG     Status: None   Collection Time: 02/27/21  8:30 AM  Result Value Ref Range   Hgb A1c MFr Bld 4.9 4.8 - 5.6 %    Comment:          Prediabetes: 5.7 - 6.4          Diabetes: >6.4          Glycemic control for adults with diabetes: <7.0   CMP12+LP+TP+TSH+6AC+CBC/D/Plt     Status: Abnormal   Collection Time: 02/27/21  8:30 AM  Result Value Ref Range   Glucose 85 70 - 99 mg/dL    Comment:               **Please note reference interval change**   Uric Acid 3.3 2.6 - 6.2 mg/dL    Comment:            Therapeutic target for gout patients: <6.0   BUN 10 6 - 24 mg/dL   Creatinine, Ser 0.74 0.57 - 1.00 mg/dL   eGFR 104 >59 mL/min/1.73   BUN/Creatinine Ratio 14 9 - 23   Sodium 140 134 - 144 mmol/L   Potassium 4.0 3.5 - 5.2 mmol/L   Chloride 106 96 - 106 mmol/L   Calcium 8.6 (L) 8.7 - 10.2 mg/dL   Phosphorus 2.5 (L) 3.0 - 4.3 mg/dL   Total Protein 6.5 6.0 - 8.5 g/dL   Albumin 4.2 3.8 - 4.8 g/dL   Globulin, Total 2.3 1.5 - 4.5 g/dL   Albumin/Globulin Ratio 1.8 1.2 - 2.2   Bilirubin Total 0.4 0.0 - 1.2 mg/dL   Alkaline Phosphatase 101 44 - 121 IU/L   LDH 155 119 - 226 IU/L   AST 16 0 - 40 IU/L   ALT 16 0 - 32 IU/L   GGT 14 0 - 60 IU/L   Iron 164 (H) 27 - 159 ug/dL   Cholesterol, Total 207 (H) 100 - 199 mg/dL   Triglycerides 156 (H) 0 - 149 mg/dL   HDL 68 >39 mg/dL   VLDL Cholesterol Cal 27 5 - 40 mg/dL   LDL Chol Calc (NIH) 112 (H) 0 - 99 mg/dL   Chol/HDL Ratio 3.0 0.0 - 4.4 ratio     Comment:                                   T. Chol/HDL Ratio                                             Men  Women                               1/2 Avg.Risk  3.4    3.3                                   Avg.Risk  5.0    4.4  2X Avg.Risk  9.6    7.1                                3X Avg.Risk 23.4   11.0    Estimated CHD Risk  < 0.5 0.0 - 1.0 times avg.    Comment: The CHD Risk is based on the T. Chol/HDL ratio. Other factors affect CHD Risk such as hypertension, smoking, diabetes, severe obesity, and family history of premature CHD.    TSH 1.320 0.450 - 4.500 uIU/mL   T4, Total 9.4 4.5 - 12.0 ug/dL   T3 Uptake Ratio 21 (L) 24 - 39 %   Free Thyroxine Index 2.0 1.2 - 4.9   WBC 5.9 3.4 - 10.8 x10E3/uL   RBC 4.53 3.77 - 5.28 x10E6/uL   Hemoglobin 14.1 11.1 - 15.9 g/dL   Hematocrit 41.0 34.0 - 46.6 %   MCV 91 79 - 97 fL   MCH 31.1 26.6 - 33.0 pg   MCHC 34.4 31.5 - 35.7 g/dL   RDW 12.2 11.7 - 15.4 %   Platelets 257 150 - 450 x10E3/uL   Neutrophils 49 Not Estab. %   Lymphs 43 Not Estab. %   Monocytes 6 Not Estab. %   Eos 1 Not Estab. %   Basos 1 Not Estab. %   Neutrophils Absolute 2.9 1.4 - 7.0 x10E3/uL   Lymphocytes Absolute 2.6 0.7 - 3.1 x10E3/uL   Monocytes Absolute 0.4 0.1 - 0.9 x10E3/uL   EOS (ABSOLUTE) 0.1 0.0 - 0.4 x10E3/uL   Basophils Absolute 0.0 0.0 - 0.2 x10E3/uL   Immature Granulocytes 0 Not Estab. %   Immature Grans (Abs) 0.0 0.0 - 0.1 x10E3/uL  POCT Urinalysis Dipstick     Status: Normal   Collection Time: 02/27/21  9:01 AM  Result Value Ref Range   Color, UA amber    Clarity, UA clear    Glucose, UA Negative Negative   Bilirubin, UA Negative    Ketones, UA Negative    Spec Grav, UA 1.010 1.010 - 1.025   Blood, UA Negative    pH, UA 5.0 5.0 - 8.0   Protein, UA Negative Negative   Urobilinogen, UA 0.2 0.2 or 1.0 E.U./dL   Nitrite, UA Negative    Leukocytes, UA Negative Negative   Appearance     Odor    Cytology - PAP      Status: None   Collection Time: 03/02/21 11:08 AM  Result Value Ref Range   High risk HPV Negative    Adequacy      Satisfactory for evaluation; transformation zone component ABSENT.   Diagnosis      - Negative for intraepithelial lesion or malignancy (NILM)   Comment Normal Reference Range HPV - Negative     Assessment & Plan:  Annual physical exam Hypertriglyceridemia Fish Oil 3048m /day Low cholesterol diet,recommended. Multi-vitamin with iron Influenza vaccine needed pending  Left knee pain occasionally since  last Spring  2022. Monitor left knee pain , when it occurs how long does it last may  need  steroids or orthopedic referral. Patient verbalizes understanding and has no questions at discharge..  Follow up with OB/GYN for breast lump evaluation. Return to the clinic as needed. Patient verbalizes understanding and has no questions at [facility]

## 2021-03-13 NOTE — Patient Instructions (Addendum)
Fish oil 3000mg  Hydrocortisone cream in ears try 1-2 times with a Q-tips  Pneumonia vaccine  needed PPV23    Dyslipidemia Dyslipidemia is an imbalance of waxy, fat-like substances (lipids) in the blood. The body needs lipids in small amounts. Dyslipidemia often involves a high level of cholesterol or triglycerides, which are types of lipids. Common forms of dyslipidemia include: High levels of LDL cholesterol. LDL is the type of cholesterol that causes fatty deposits (plaques) to build up in the blood vessels that carry blood away from the heart (arteries). Low levels of HDL cholesterol. HDL cholesterol is the type of cholesterol that protects against heart disease. High levels of HDL remove the LDL buildup from arteries. High levels of triglycerides. Triglycerides are a fatty substance in the blood that is linked to a buildup of plaques in the arteries. What are the causes? There are two main types of dyslipidemia: primary and secondary. Primary dyslipidemia is caused by changes (mutations) in genes that are passed down through families (inherited). These mutations cause several types of dyslipidemia. Secondary dyslipidemia may be caused by various risk factors that can lead to the disease, such as lifestyle choices and certain medical conditions. What increases the risk? You are more likely to develop this condition if you are an older man or if you are a woman who has gone through menopause. Other risk factors include: Having a family history of dyslipidemia. Taking certain medicines, including birth control pills, steroids, some diuretics, and beta-blockers. Eating a diet high in saturated fat. Smoking cigarettes or excessive alcohol intake. Having certain medical conditions such as diabetes, polycystic ovary syndrome (PCOS), kidney disease, liver disease, or hypothyroidism. Not exercising regularly. Being overweight or obese with too much belly fat. What are the signs or symptoms? In  most cases, dyslipidemia does not usually cause any symptoms. In severe cases, very high lipid levels can cause: Fatty bumps under the skin (xanthomas). A white or gray ring around the black center (pupil) of the eye. Very high triglyceride levels can cause inflammation of the pancreas (pancreatitis). How is this diagnosed? Your health care provider may diagnose dyslipidemia based on a routine blood test (fasting blood test). Because most people do not have symptoms of the condition, this blood testing (lipid profile) is done on adults age 64 and older and is repeated every 4-6 years. This test checks: Total cholesterol. This measures the total amount of cholesterol in your blood, including LDL cholesterol, HDL cholesterol, and triglycerides. A healthy number is below 200 mg/dL (5.17 mmol/L). LDL cholesterol. The target number for LDL cholesterol is different for each person, depending on individual risk factors. A healthy number is usually below 100 mg/dL (2.59 mmol/L). Ask your health care provider what your LDL cholesterol should be. HDL cholesterol. An HDL level of 60 mg/dL (1.55 mmol/L) or higher is best because it helps to protect against heart disease. A number below 40 mg/dL (1.03 mmol/L) for men or below 50 mg/dL (1.29 mmol/L) for women increases the risk for heart disease. Triglycerides. A healthy triglyceride number is below 150 mg/dL (1.69 mmol/L). If your lipid profile is abnormal, your health care provider may do other blood tests. How is this treated? Treatment depends on the type of dyslipidemia that you have and your other risk factors for heart disease and stroke. Your health care provider will have a target range for your lipid levels based on this information. Treatment for dyslipidemia starts with lifestyle changes, such as diet and exercise. Your health care provider may  recommend that you: Get regular exercise. Make changes to your diet. Quit smoking if you smoke. Limit your  alcohol intake. If diet changes and exercise do not help you reach your goals, your health care provider may also prescribe medicine to lower lipids. The most commonly prescribed type of medicine lowers your LDL cholesterol (statin drug). If you have a high triglyceride level, your provider may prescribe another type of drug (fibrate) or an omega-3 fish oil supplement, or both. Follow these instructions at home: Eating and drinking  Follow instructions from your health care provider or dietitian about eating or drinking restrictions. Eat a healthy diet as told by your health care provider. This can help you reach and maintain a healthy weight, lower your LDL cholesterol, and raise your HDL cholesterol. This may include: Limiting your calories, if you are overweight. Eating more fruits, vegetables, whole grains, fish, and lean meats. Limiting saturated fat, trans fat, and cholesterol. Do not drink alcohol if: Your health care provider tells you not to drink. You are pregnant, may be pregnant, or are planning to become pregnant. If you drink alcohol: Limit how much you have to: 0-1 drink a day for women. 0-2 drinks a day for men. Know how much alcohol is in your drink. In the [place] one drink equals one 12 oz bottle of beer (355 mL), one 5 oz glass of wine (148 mL), or one 1 oz glass of hard liquor (44 mL). Activity Get regular exercise. Start an exercise and strength training program as told by your health care provider. Ask your health care provider what activities are safe for you. Your health care provider may recommend: 30 minutes of aerobic activity 4-6 days a week. Brisk walking is an example of aerobic activity. Strength training 2 days a week. General instructions Do not use any products that contain nicotine or tobacco. These products include cigarettes, chewing tobacco, and vaping devices, such as e-cigarettes. If you need help quitting, ask your health care provider. Take  over-the-counter and prescription medicines only as told by your health care provider. This includes supplements. Keep all follow-up visits. This is important. Contact a health care provider if: You are having trouble sticking to your exercise or diet plan. You are struggling to quit smoking or to control your use of alcohol. Summary Dyslipidemia often involves a high level of cholesterol or triglycerides, which are types of lipids. Treatment depends on the type of dyslipidemia that you have and your other risk factors for heart disease and stroke. Treatment for dyslipidemia starts with lifestyle changes, such as diet and exercise. Your health care provider may prescribe medicine to lower lipids. This information is not intended to replace advice given to you by your health care provider. Make sure you discuss any questions you have with your health care provider. Document Revised: 07/10/2020 Document Reviewed: 07/10/2020 Elsevier Patient Education  2022 [employer].

## 2021-03-20 DIAGNOSIS — C50912 Malignant neoplasm of unspecified site of left female breast: Secondary | ICD-10-CM | POA: Insufficient documentation

## 2021-03-21 ENCOUNTER — Other Ambulatory Visit: Payer: Self-pay

## 2021-03-21 ENCOUNTER — Ambulatory Visit
Admission: RE | Admit: 2021-03-21 | Discharge: 2021-03-21 | Disposition: A | Discharge status: Home / Self Care | Payer: BC Managed Care – PPO | Source: Ambulatory Visit | Attending: Obstetrics | Admitting: Obstetrics

## 2021-03-21 DIAGNOSIS — R928 Other abnormal and inconclusive findings on diagnostic imaging of breast: Secondary | ICD-10-CM

## 2021-03-21 DIAGNOSIS — N6320 Unspecified lump in the left breast, unspecified quadrant: Secondary | ICD-10-CM | POA: Diagnosis present

## 2021-03-22 ENCOUNTER — Encounter: Payer: Self-pay | Admitting: Medical

## 2021-03-22 ENCOUNTER — Other Ambulatory Visit: Payer: Self-pay | Admitting: Obstetrics & Gynecology

## 2021-03-22 DIAGNOSIS — R928 Other abnormal and inconclusive findings on diagnostic imaging of breast: Secondary | ICD-10-CM

## 2021-03-22 DIAGNOSIS — N63 Unspecified lump in unspecified breast: Secondary | ICD-10-CM

## 2021-03-22 NOTE — Telephone Encounter (Signed)
Tried to call pt, no answer at this time. [place] should be reaching out to patient, and will arrange oncologist and even breast surgeon.  If she would like recommendation, then Dr Bary Castilla is our recommendation Sales promotion account executive).     A order has been placed, although this should be done by the biopsy physician.

## 2021-03-22 NOTE — Telephone Encounter (Signed)
Patient had her breast biopsy done yesterday. She was advised it is cancerous. They now need an order for lymph node biopsy. They are trying to schedule her for tomorrow. Dr Jimmye Norman did the biopsy. If you have any questions please contact Vaughan Basta w/Dr. Jimmye Norman office at [phone]. Patient also inquiring which surgeon and oncologist we typically refer to.

## 2021-03-23 ENCOUNTER — Other Ambulatory Visit: Payer: Self-pay | Admitting: Obstetrics & Gynecology

## 2021-03-23 ENCOUNTER — Encounter: Payer: Self-pay | Admitting: Nurse Practitioner

## 2021-03-23 ENCOUNTER — Other Ambulatory Visit: Payer: Self-pay | Admitting: Medical

## 2021-03-23 DIAGNOSIS — R928 Other abnormal and inconclusive findings on diagnostic imaging of breast: Secondary | ICD-10-CM

## 2021-03-23 NOTE — Telephone Encounter (Signed)
LMVM to notify of Dr. Kenton Kingfisher response. Advised to return call with any further questions or concerns.

## 2021-03-25 NOTE — Progress Notes (Signed)
Spoke with patient .  She is scheduled with Dr. Bary Castilla on 03/27/21.  Will schedule Med/Onc consult after this visit.

## 2021-03-26 ENCOUNTER — Ambulatory Visit: Payer: BC Managed Care – PPO | Admitting: Medical

## 2021-03-26 ENCOUNTER — Other Ambulatory Visit: Payer: Self-pay

## 2021-03-26 ENCOUNTER — Other Ambulatory Visit: Payer: Self-pay | Admitting: Medical

## 2021-03-26 DIAGNOSIS — Z760 Encounter for issue of repeat prescription: Secondary | ICD-10-CM

## 2021-03-26 DIAGNOSIS — Z719 Counseling, unspecified: Secondary | ICD-10-CM

## 2021-03-26 MED ORDER — MONTELUKAST SODIUM 10 MG PO TABS: ORAL_TABLET | ORAL | 1 refills | Status: DC

## 2021-03-26 NOTE — Progress Notes (Unsigned)
Refill on Montelukast.

## 2021-03-26 NOTE — Progress Notes (Signed)
I called patient per her request. Lots of questions that I feel the surgeon and oncologist can answer better then I.  Recommended she write down her questions so that she can get them all answered.She meets with surgeon tomorrow. She will keep me informed. Seeing Dr. Bary Castilla.    ADDENDUM REPORT: 03/22/2021 13:02   ADDENDUM: PATHOLOGY revealed: A. BREAST, LEFT 11:00 6 CM FN; ULTRASOUND-GUIDED BIOPSY: - INVASIVE MAMMARY CARCINOMA, NO SPECIAL TYPE, WITH OSTEOCLAST-LIKE STROMAL GIANT CELLS. Size of invasive carcinoma: 12 mm in this sample. Grade 3. Ductal carcinoma in situ: Present, high-grade. Lymphovascular invasion: Focus suspicious for Lymphovascular invasion.   Pathology results are CONCORDANT with imaging findings, per Dr. Dorise Bullion.   Pathology results and recommendations were discussed with patient via telephone on 03/22/2021. Patient reported biopsy site doing well with no adverse symptoms, and only slight tenderness at the site. Post biopsy care instructions were reviewed, questions were answered and my direct phone number was provided. Patient was instructed to call [facility] for any additional questions or concerns related to biopsy site.   RECOMMENDATIONS: 1. Please schedule patient for ultrasound guided LEFT axillary lymph node biopsy given prominent to mildly thickened cortices noted on imaging.   2. Surgical consultation. Request for surgical consultation relayed to Al Pimple RN at [facility] by Electa Sniff RN on 03/22/2021.   3. 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   Pathology results reported by Electa Sniff RN on 03/22/2021.     Electronically Signed   By: Dorise Bullion III M.D.   On: 03/22/2021 13:02    Addended by York Ram, MD on 03/22/2021  3:02 PM   Study Result  Narrative & Impression  CLINICAL DATA:  Biopsy of an 11 o'clock left breast  mass.   EXAM: ULTRASOUND GUIDED LEFT BREAST CORE NEEDLE BIOPSY   COMPARISON:  Previous exam(s).   PROCEDURE: I met with the patient and we discussed the procedure of ultrasound-guided biopsy, including benefits and alternatives. We discussed the high likelihood of a successful procedure. We discussed the risks of the procedure, including infection, bleeding, tissue injury, clip migration, and inadequate sampling. Informed written consent was given. The usual time-out protocol was performed immediately prior to the procedure.   Lesion quadrant: 11 o'clock left breast   Using sterile technique and 1% Lidocaine as local anesthetic, under direct ultrasound visualization, a 12 gauge spring-loaded device was used to perform biopsy of the 11 o'clock left breast mass using a medial approach. At the conclusion of the procedure a ribbon shaped tissue marker clip was deployed into the biopsy cavity. Follow up 2 view mammogram was performed and dictated separately.   IMPRESSION: Ultrasound guided biopsy of an 11 o'clock left breast mass. No apparent complications.   Electronically Signed: By: Dorise Bullion III M.D. On: 03/21/2021 09:41     Result History  [place] LT BREAST BX W LOC DEV 1ST LESION IMG BX SPEC US GUIDE (Order [id number]) on 03/22/2021 - Order Result History Report - Result Edited   MM CLIP PLACEMENT LEFT (Order [id number]) - Reflex for Order [id number]   Study Result  Narrative & Impression  CLINICAL DATA:  Evaluate biopsy marker.   EXAM: 3D DIAGNOSTIC LEFT MAMMOGRAM POST ULTRASOUND BIOPSY   COMPARISON:  Previous exam(s).   FINDINGS: 3D Mammographic images were obtained following ultrasound guided biopsy of a left breast mass. The biopsy marking clip is in expected position at the site of  biopsy.   IMPRESSION: Appropriate positioning of the ribbon shaped biopsy marking clip at the site of biopsy in the biopsied left breast mass.   Final Assessment: Post  Procedure Mammograms for Marker Placement     Electronically Signed   By: Dorise Bullion III M.D.   On: 03/21/2021 10:14        Result History  MM CLIP PLACEMENT LEFT (Order #[id number]) on 03/21/2021 - Order Result History Report - Result Edited    MM DIAG BREAST TOMO BILATERAL (Order [id number])   Study Result  Narrative & Impression  CLINICAL DATA:  Patient reports a palpable lump in the upper left breast, which she has noticed for several weeks. Patient received a COVID-19 vaccine booster 1 week ago in the left arm.   EXAM: DIGITAL DIAGNOSTIC BILATERAL MAMMOGRAM WITH TOMOSYNTHESIS AND CAD; ULTRASOUND LEFT BREAST LIMITED   TECHNIQUE: Bilateral digital diagnostic mammography and breast tomosynthesis was performed. The images were evaluated with computer-aided detection.; Targeted ultrasound examination of the left breast was performed.   COMPARISON:  None.   ACR Breast Density Category c: The breast tissue is heterogeneously dense, which may obscure small masses.   FINDINGS: There is a partly circumscribed oval mass in the upper left breast, proximally 1.6 cm in long axis, corresponding to the palpable abnormality.   There are no other masses, no areas of architectural distortion and no suspicious calcifications.   On physical exam, there is a firm palpable, mobile, mass in the upper left breast between 11 and 12 o'clock.   Targeted ultrasound is performed, showing a hypoechoic mass in the left breast at 11 o'clock, 6 cm the nipple. Mass has partly circumscribed partly ill-defined margins and measures 1.7 x 1.3 x 1.7 cm. Internal blood flow is noted on color Doppler analysis. This mass corresponds to the palpable abnormality.   Sonographic evaluation of the left axilla shows several lymph nodes with prominent cortices measuring up to 5 mm in thickness. All nodes are normal in overall size with well maintained hilar fat.   IMPRESSION: 1. Suspicious 1.7 cm  mass in the left breast at 11 o'clock, 6 cm from the nipple. Tissue sampling is recommended. 2. Several left axillary lymph nodes show prominent to mildly thickened cortices. These are likely reactive from the recent COVID-19 vaccine booster.   RECOMMENDATION: 1. Ultrasound-guided core needle biopsy of the left breast mass. If this is positive for carcinoma, then having the patient return for ultrasound-guided core needle biopsy of 1 of the left axillary lymph nodes should be considered.   I have discussed the findings and recommendations with the patient. If applicable, a reminder letter will be sent to the patient regarding the next appointment.   BI-RADS CATEGORY  4: Suspicious.     Electronically Signed   By: Lajean Manes M.D.   On: 03/09/2021 16:30

## 2021-03-27 ENCOUNTER — Other Ambulatory Visit: Payer: Self-pay | Admitting: General Surgery

## 2021-03-27 DIAGNOSIS — C50919 Malignant neoplasm of unspecified site of unspecified female breast: Secondary | ICD-10-CM

## 2021-03-27 LAB — SURGICAL PATHOLOGY

## 2021-03-28 ENCOUNTER — Encounter: Payer: Self-pay | Admitting: General Surgery

## 2021-03-28 LAB — SURGICAL PATHOLOGY

## 2021-03-30 ENCOUNTER — Ambulatory Visit: Payer: BC Managed Care – PPO

## 2021-03-30 ENCOUNTER — Ambulatory Visit: Admission: RE | Admit: 2021-03-30 | Payer: BC Managed Care – PPO | Source: Ambulatory Visit

## 2021-04-02 DIAGNOSIS — Z170 Estrogen receptor positive status [ER+]: Secondary | ICD-10-CM | POA: Insufficient documentation

## 2021-04-02 DIAGNOSIS — C50212 Malignant neoplasm of upper-inner quadrant of left female breast: Secondary | ICD-10-CM | POA: Insufficient documentation

## 2021-04-02 NOTE — Progress Notes (Addendum)
Hematology/Oncology Consult note [facility] Telephone:([phone] [phone]  Patient Care Team: Rolanda Lundborg as PCP - General (Physician Assistant)   Name of the patient: Madison Sullivan  [id number]  01/03/1981    Reason for referral-new diagnosis of breast cancer   Referring physician: Dr. Bary Castilla  Date of visit: 04/02/21   History of presenting illness- Patient is a 40 year old female who noticed fullness in the upper aspect of the left breast which she brought to the attention of GYN.  Diagnostic bilateral mammogram in October 2022 showed hypoechoic mass in the 11 o'clock position in the left breast measuring 1.7 x 1.3 x 1.7 cm.  Several left axillary lymph nodes prominent to mildly thickened cortices.  These are likely reactive from recent COVID-vaccine..  Left breast biopsy of the mass showed invasive mammary carcinoma grade 3 ER greater than 90% positive PR greater than 90% positive and HER2 negative.  Patient met with Dr. Bary Castilla who also biopsied one of the concerning lymph nodes in the mammogram which was negative for malignancy.  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  Family history of breast cancer in paternal grandmother, skin cancers in both her parents but she does not know if that was a melanoma.  She is G0, P0 and has decided against childbearing.  Menarche at the age of 9.  She is currently premenopausal.  She does not desire any children in the future    ECOG PS- 0  Pain scale- 0   Review of systems- Review of Systems  Constitutional:  Negative for chills, fever, malaise/fatigue and weight loss.  HENT:  Negative for congestion, ear discharge and nosebleeds.   Eyes:  Negative for blurred vision.  Respiratory:  Negative for cough, hemoptysis, sputum production, shortness of breath and wheezing.   Cardiovascular:  Negative for chest pain, palpitations, orthopnea and claudication.  Gastrointestinal:   Negative for abdominal pain, blood in stool, constipation, diarrhea, heartburn, melena, nausea and vomiting.  Genitourinary:  Negative for dysuria, flank pain, frequency, hematuria and urgency.  Musculoskeletal:  Negative for back pain, joint pain and myalgias.  Skin:  Negative for rash.  Neurological:  Negative for dizziness, tingling, focal weakness, seizures, weakness and headaches.  Endo/Heme/Allergies:  Does not bruise/bleed easily.  Psychiatric/Behavioral:  Negative for depression and suicidal ideas. The patient does not have insomnia.    Allergies  Allergen Reactions   Aspirin Shortness Of Breath    Avoids it due to her asthma   Iodine Rash    Patient Active Problem List   Diagnosis Date Noted   Breast mass in female 03/12/2021   Fibroids 02/08/2020   Eustachian tube dysfunction 06/24/2016   Sinusitis 06/24/2016   Bronchitis 08/29/2015   Asthma 07/19/2015   Vitamin D deficiency 03/24/2015     Past Medical History:  Diagnosis Date   Asthma    History of migraine headaches    Abstract from medicat   Uterine myoma      Past Surgical History:  Procedure Laterality Date   NASAL SINUS SURGERY Bilateral 1998   corrected nasal septum   UTERINE FIBROID SURGERY  2005   in [place],horrible blood loss; not cancerous, "just a growth" some  being watched by OB/GYN.    Social History   Socioeconomic History   Marital status: Single    Spouse name: Not on file   Number of children: Not on file   Years of education: 18   Highest education level: Not on  file  Occupational History   Occupation: professor  Tobacco Use   Smoking status: Never   Smokeless tobacco: Never  Vaping Use   Vaping Use: Never used  Substance and Sexual Activity   Alcohol use: No    Comment: less than once a week a drink   Drug use: No   Sexual activity: Not Currently    Birth control/protection: Inserts  Other Topics Concern   Not on file  Social History Narrative   Not on file   Social  Determinants of Health   Financial Resource Strain: Not on file  Food Insecurity: Not on file  Transportation Needs: Not on file  Physical Activity: Not on file  Stress: Not on file  Social Connections: Not on file  Intimate Partner Violence: Not on file     Family History  Problem Relation Age of Onset   Endometriosis Mother    Hypertension Mother    Depression Mother    Migraines Mother    Cancer Mother        brain tumors   Skin cancer Father    Hypertension Father    Crohn's disease Father    Asthma Father    Breast cancer Paternal Grandmother    Endometriosis Sister    Depression Sister    Asthma Brother    Hip fracture Paternal Grandfather      Current Outpatient Medications:    albuterol (PROVENTIL HFA;VENTOLIN HFA) 108 (90 Base) MCG/ACT inhaler, Inhale into the lungs., Disp: , Rfl:    benzonatate (TESSALON) 100 MG capsule, Take 1-2 capsules by mouth every 8 hours as needed for cough. Swallow whole, Disp: 30 capsule, Rfl: 0   Butalbital-APAP-Caffeine 50-325-40 MG capsule, Take 1-2 capsules by mouth every 6 (six) hours as needed for headache., Disp: 20 capsule, Rfl: 1   cyclobenzaprine (FLEXERIL) 5 MG tablet, Take 1 tablet (5 mg total) by mouth at bedtime. (Patient not taking: Reported on 03/09/2020), Disp: 20 tablet, Rfl: 0   Estradiol 1.25 MG/1.25GM GEL, Place 1 application onto the skin as needed (Daily during hormone free interval). (Patient not taking: Reported on 03/13/2021), Disp: 9 g, Rfl: 4   etonogestrel-ethinyl estradiol (NUVARING) 0.12-0.015 MG/24HR vaginal ring, Insert vaginally and leave in place for 4 consecutive weeks, then replace with new ring., Disp: 3 each, Rfl: 4   fluticasone (FLONASE) 50 MCG/ACT nasal spray, Place 2 sprays into both nostrils daily., Disp: , Rfl:    hydrOXYzine (VISTARIL) 25 MG capsule, 1 tab PO TID PRN for dizziness. May cause drowsiness and take as night only if develops drowsiness (Patient not taking: Reported on 03/09/2020),  Disp: 21 capsule, Rfl: 0   ibuprofen (ADVIL,MOTRIN) 800 MG tablet, Take 1 tablet (800 mg total) by mouth every 8 (eight) hours as needed., Disp: 30 tablet, Rfl: 0   loratadine (CLARITIN) 10 MG tablet, Take by mouth., Disp: , Rfl:    montelukast (SINGULAIR) 10 MG tablet, TAKE 1 TABLET(10 MG) BY MOUTH AT BEDTIME, Disp: 90 tablet, Rfl: 1   predniSONE (DELTASONE) 20 MG tablet, Take 1 tablet (20 mg total) by mouth 2 (two) times daily with a meal., Disp: 10 tablet, Rfl: 0   sodium chloride (OCEAN) 0.65 % SOLN nasal spray, Place 1 spray into both nostrils as needed for congestion., Disp: 1 Bottle, Rfl: 6   Physical exam:  Vitals:   04/03/21 0844  BP: 128/89  Pulse: 87  Resp: 18  Temp: 98.2 F (36.8 C)  SpO2: 98%  Weight: 156 lb (70.8 kg)  Height: 5' 10.5" (1.791 m)   Physical Exam Cardiovascular:     Rate and Rhythm: Normal rate and regular rhythm.     Heart sounds: Normal heart sounds.  Pulmonary:     Effort: Pulmonary effort is normal.     Breath sounds: Normal breath sounds.  Abdominal:     General: Bowel sounds are normal.     Palpations: Abdomen is soft.  Skin:    General: Skin is warm and dry.  Neurological:     Mental Status: She is alert and oriented to person, place, and time.    Breast exam: No palpable right breast mass or right axillary adenopathy.  Induration and roughly 2 cm mass noted in the upper outer quadrant of the left breast.  No palpable left axillary adenopathy.   CMP Latest Ref Rng & Units 02/27/2021  Glucose 70 - 99 mg/dL 85  BUN 6 - 24 mg/dL 10  Creatinine 0.57 - 1.00 mg/dL 0.74  Sodium 134 - 144 mmol/L 140  Potassium 3.5 - 5.2 mmol/L 4.0  Chloride 96 - 106 mmol/L 106  CO2 20 - 29 mmol/L -  Calcium 8.7 - 10.2 mg/dL 8.6(L)  Total Protein 6.0 - 8.5 g/dL 6.5  Total Bilirubin 0.0 - 1.2 mg/dL 0.4  Alkaline Phos 44 - 121 IU/L 101  AST 0 - 40 IU/L 16  ALT 0 - 32 IU/L 16   CBC Latest Ref Rng & Units 02/27/2021  WBC 3.4 - 10.8 x10E3/uL 5.9  Hemoglobin  11.1 - 15.9 g/dL 14.1  Hematocrit 34.0 - 46.6 % 41.0  Platelets 150 - 450 x10E3/uL 257    No images are attached to the encounter.  US BREAST LTD UNI LEFT INC AXILLA  Result Date: 03/09/2021 CLINICAL DATA:  Patient reports a palpable lump in the upper left breast, which she has noticed for several weeks. Patient received a COVID-19 vaccine booster 1 week ago in the left arm. EXAM: DIGITAL DIAGNOSTIC BILATERAL MAMMOGRAM WITH TOMOSYNTHESIS AND CAD; ULTRASOUND LEFT BREAST LIMITED TECHNIQUE: Bilateral digital diagnostic mammography and breast tomosynthesis was performed. The images were evaluated with computer-aided detection.; Targeted ultrasound examination of the left breast was performed. COMPARISON:  None. ACR Breast Density Category c: The breast tissue is heterogeneously dense, which may obscure small masses. FINDINGS: There is a partly circumscribed oval mass in the upper left breast, proximally 1.6 cm in long axis, corresponding to the palpable abnormality. There are no other masses, no areas of architectural distortion and no suspicious calcifications. On physical exam, there is a firm palpable, mobile, mass in the upper left breast between 11 and 12 o'clock. Targeted ultrasound is performed, showing a hypoechoic mass in the left breast at 11 o'clock, 6 cm the nipple. Mass has partly circumscribed partly ill-defined margins and measures 1.7 x 1.3 x 1.7 cm. Internal blood flow is noted on color Doppler analysis. This mass corresponds to the palpable abnormality. Sonographic evaluation of the left axilla shows several lymph nodes with prominent cortices measuring up to 5 mm in thickness. All nodes are normal in overall size with well maintained hilar fat. IMPRESSION: 1. Suspicious 1.7 cm mass in the left breast at 11 o'clock, 6 cm from the nipple. Tissue sampling is recommended. 2. Several left axillary lymph nodes show prominent to mildly thickened cortices. These are likely reactive from the recent  COVID-19 vaccine booster. RECOMMENDATION: 1. Ultrasound-guided core needle biopsy of the left breast mass. If this is positive for carcinoma, then having the patient return for ultrasound-guided core  needle biopsy of 1 of the left axillary lymph nodes should be considered. I have discussed the findings and recommendations with the patient. If applicable, a reminder letter will be sent to the patient regarding the next appointment. BI-RADS CATEGORY  4: Suspicious. Electronically Signed   By: Lajean Manes M.D.   On: 03/09/2021 16:30  MM DIAG BREAST TOMO BILATERAL  Result Date: 03/09/2021 CLINICAL DATA:  Patient reports a palpable lump in the upper left breast, which she has noticed for several weeks. Patient received a COVID-19 vaccine booster 1 week ago in the left arm. EXAM: DIGITAL DIAGNOSTIC BILATERAL MAMMOGRAM WITH TOMOSYNTHESIS AND CAD; ULTRASOUND LEFT BREAST LIMITED TECHNIQUE: Bilateral digital diagnostic mammography and breast tomosynthesis was performed. The images were evaluated with computer-aided detection.; Targeted ultrasound examination of the left breast was performed. COMPARISON:  None. ACR Breast Density Category c: The breast tissue is heterogeneously dense, which may obscure small masses. FINDINGS: There is a partly circumscribed oval mass in the upper left breast, proximally 1.6 cm in long axis, corresponding to the palpable abnormality. There are no other masses, no areas of architectural distortion and no suspicious calcifications. On physical exam, there is a firm palpable, mobile, mass in the upper left breast between 11 and 12 o'clock. Targeted ultrasound is performed, showing a hypoechoic mass in the left breast at 11 o'clock, 6 cm the nipple. Mass has partly circumscribed partly ill-defined margins and measures 1.7 x 1.3 x 1.7 cm. Internal blood flow is noted on color Doppler analysis. This mass corresponds to the palpable abnormality. Sonographic evaluation of the left axilla shows  several lymph nodes with prominent cortices measuring up to 5 mm in thickness. All nodes are normal in overall size with well maintained hilar fat. IMPRESSION: 1. Suspicious 1.7 cm mass in the left breast at 11 o'clock, 6 cm from the nipple. Tissue sampling is recommended. 2. Several left axillary lymph nodes show prominent to mildly thickened cortices. These are likely reactive from the recent COVID-19 vaccine booster. RECOMMENDATION: 1. Ultrasound-guided core needle biopsy of the left breast mass. If this is positive for carcinoma, then having the patient return for ultrasound-guided core needle biopsy of 1 of the left axillary lymph nodes should be considered. I have discussed the findings and recommendations with the patient. If applicable, a reminder letter will be sent to the patient regarding the next appointment. BI-RADS CATEGORY  4: Suspicious. Electronically Signed   By: Lajean Manes M.D.   On: 03/09/2021 16:30  MM CLIP PLACEMENT LEFT  Result Date: 03/21/2021 CLINICAL DATA:  Evaluate biopsy marker. EXAM: 3D DIAGNOSTIC LEFT MAMMOGRAM POST ULTRASOUND BIOPSY COMPARISON:  Previous exam(s). FINDINGS: 3D Mammographic images were obtained following ultrasound guided biopsy of a left breast mass. The biopsy marking clip is in expected position at the site of biopsy. IMPRESSION: Appropriate positioning of the ribbon shaped biopsy marking clip at the site of biopsy in the biopsied left breast mass. Final Assessment: Post Procedure Mammograms for Marker Placement Electronically Signed   By: Dorise Bullion III M.D.   On: 03/21/2021 10:14  [place] LT BREAST BX W LOC DEV 1ST LESION IMG BX SPEC US GUIDE  Addendum Date: 03/22/2021   ADDENDUM REPORT: 03/22/2021 13:02 ADDENDUM: PATHOLOGY revealed: A. BREAST, LEFT 11:00 6 CM FN; ULTRASOUND-GUIDED BIOPSY: - INVASIVE MAMMARY CARCINOMA, NO SPECIAL TYPE, WITH OSTEOCLAST-LIKE STROMAL GIANT CELLS. Size of invasive carcinoma: 12 mm in this sample. Grade 3. Ductal carcinoma in  situ: Present, high-grade. Lymphovascular invasion: Focus suspicious for Lymphovascular invasion. Pathology results  are CONCORDANT with imaging findings, per Dr. Dorise Bullion. Pathology results and recommendations were discussed with patient via telephone on 03/22/2021. Patient reported biopsy site doing well with no adverse symptoms, and only slight tenderness at the site. Post biopsy care instructions were reviewed, questions were answered and my direct phone number was provided. Patient was instructed to call [facility] for any additional questions or concerns related to biopsy site. RECOMMENDATIONS: 1. Please schedule patient for ultrasound guided LEFT axillary lymph node biopsy given prominent to mildly thickened cortices noted on imaging. 2. Surgical consultation. Request for surgical consultation relayed to Al Pimple RN at [facility] by Electa Sniff RN on 03/22/2021. 3. 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 Pathology results reported by Electa Sniff RN on 03/22/2021. Electronically Signed   By: Dorise Bullion III M.D.   On: 03/22/2021 13:02   Result Date: 03/22/2021 CLINICAL DATA:  Biopsy of an 11 o'clock left breast mass. EXAM: ULTRASOUND GUIDED LEFT BREAST CORE NEEDLE BIOPSY COMPARISON:  Previous exam(s). PROCEDURE: I met with the patient and we discussed the procedure of ultrasound-guided biopsy, including benefits and alternatives. We discussed the high likelihood of a successful procedure. We discussed the risks of the procedure, including infection, bleeding, tissue injury, clip migration, and inadequate sampling. Informed written consent was given. The usual time-out protocol was performed immediately prior to the procedure. Lesion quadrant: 11 o'clock left breast Using sterile technique and 1% Lidocaine as local anesthetic, under direct ultrasound visualization, a 12 gauge spring-loaded device was used to perform  biopsy of the 11 o'clock left breast mass using a medial approach. At the conclusion of the procedure a ribbon shaped tissue marker clip was deployed into the biopsy cavity. Follow up 2 view mammogram was performed and dictated separately. IMPRESSION: Ultrasound guided biopsy of an 11 o'clock left breast mass. No apparent complications. Electronically Signed: By: Dorise Bullion III M.D. On: 03/21/2021 09:41    Assessment and plan- Patient is a 40 y.o. female with new diagnosis of invasive mammary carcinoma of the left breast clinical prognostic stage Ia cT1c cN0 M0 ER/PR positive HER2 negative  Discussed results of mammogram and pathology with the patient in detail.Mammogram shows 1.7 cm suspicious left breast mass which was biopsy-proven grade 3 invasive mammary carcinoma ER/PR positive and HER2 negative.  There were prominent left axillary lymph nodes which appeared more reactive secondary to COVID-vaccine booster and one of them was also biopsied and negative for malignancy.  She therefore has clinical stage Ia disease.  I would recommend upfront lumpectomy and sentinel lymph node biopsy at this time.  If final pathology shows no involvement of lymph nodes-I would recommend sending Oncotype testing to determine if she would benefit from adjuvant chemotherapy.  Discussed what Oncotype testing is and how the results are interpreted.  If her score is less than 11 she will fall in the low risk group and would not benefit from adjuvant chemotherapy.  If her score is 26 or higher she will follow in the high risk group and would benefit from adjuvant chemotherapy.  If her score is between 12-25, chemotherapy would be considered on a case-by-case basis.  I am more likely to recommend chemotherapy if her score is 16 or higher given her young age, grade 3 histology.  Ovarian suppression plus AI could be considered alone without chemotherapy if her score is between 12-15.  I would prefer non anthracycline regimen  like TC chemotherapy if  she has node-negative disease and requires chemotherapy.  She does not require any systemic imaging at this time.  Radiology did recommend consideration for bilateral breast MRI given the associated high-grade DCIS along with invasive mammary carcinoma.  I will defer this decision to Dr. Bary Castilla at this time and it would not change management from medical oncology standpoint  If she has lymph node positive disease however given that she is young and premenopausal she would benefit from adjuvant chemotherapy regardless of Oncotype testing.  When lymph node positive disease I would prefer to use the [facility] Taxol regimen.  Discussed risks and benefits of both AC Taxol and Taxotere Cytoxan regimen including all but not limited to nausea, vomiting, low blood counts, risk of infections and hospitalization.  Risk of peripheral neuropathy associated with Taxol as well as Taxotere.  Risk of anthracycline associated cardiotoxicity and possible risk of leukemias.  Treatment will be given with a curative intent.  Her tumor is strongly estrogen and progesterone positive and there would also be a role for endocrine therapy down the line.  We could potentially consider ovarian suppression plus AI instead of tamoxifen based on final pathology.  Following lumpectomy and sentinel lymph node biopsy patient would also benefit from adjuvant radiation treatment.  If chemotherapy is being planned that would be done before radiation treatment.  Discussed briefly what to expect from radiation treatment.  She will be seen today by genetic counseling as well.  Dr. Bary Castilla has already sent of genetic testing on her as well.  Patient would like second opinion by both surgical oncology and medical oncology at [facility] and we will make a referral for the same.  Cancer Staging Malignant neoplasm of upper-inner quadrant of left breast in female, estrogen receptor positive ([place]) Staging form: Breast, AJCC 8th  Edition - Clinical stage from 04/02/2021: Stage IA (cT1c, cN0, cM0, G3, ER+, PR+, HER2-) - Signed by Sindy Guadeloupe, MD on 04/02/2021 Histologic grading system: 3 grade system    Thank you for this kind referral and the opportunity to participate in the care of this patient   Visit Diagnosis 1. Malignant neoplasm of upper-inner quadrant of left breast in female, estrogen receptor positive ([place])   2. Goals of care, counseling/discussion     Dr. Randa Evens, MD, MPH [facility] [phone] 04/02/2021 12:42 PM

## 2021-04-03 ENCOUNTER — Inpatient Hospital Stay: Payer: BC Managed Care – PPO | Admitting: Licensed Clinical Social Worker

## 2021-04-03 ENCOUNTER — Encounter: Payer: Self-pay | Admitting: Licensed Clinical Social Worker

## 2021-04-03 ENCOUNTER — Inpatient Hospital Stay: Payer: BC Managed Care – PPO | Attending: Oncology | Admitting: Oncology

## 2021-04-03 ENCOUNTER — Telehealth: Payer: Self-pay | Admitting: Licensed Clinical Social Worker

## 2021-04-03 ENCOUNTER — Inpatient Hospital Stay: Payer: BC Managed Care – PPO

## 2021-04-03 ENCOUNTER — Encounter: Payer: Self-pay | Admitting: Oncology

## 2021-04-03 ENCOUNTER — Other Ambulatory Visit: Payer: Self-pay

## 2021-04-03 VITALS — BP 128/89 | HR 87 | Temp 98.2°F | Resp 18 | Ht 70.5 in | Wt 156.0 lb

## 2021-04-03 DIAGNOSIS — C50212 Malignant neoplasm of upper-inner quadrant of left female breast: Secondary | ICD-10-CM | POA: Diagnosis not present

## 2021-04-03 DIAGNOSIS — Z170 Estrogen receptor positive status [ER+]: Secondary | ICD-10-CM | POA: Diagnosis not present

## 2021-04-03 DIAGNOSIS — Z808 Family history of malignant neoplasm of other organs or systems: Secondary | ICD-10-CM | POA: Insufficient documentation

## 2021-04-03 DIAGNOSIS — Z803 Family history of malignant neoplasm of breast: Secondary | ICD-10-CM | POA: Diagnosis not present

## 2021-04-03 DIAGNOSIS — Z7189 Other specified counseling: Secondary | ICD-10-CM | POA: Diagnosis not present

## 2021-04-03 NOTE — Progress Notes (Signed)
REFERRING PROVIDER: Sindy Guadeloupe, MD [place],  [place] [address]  PRIMARY PROVIDER:  Talmage Nap, PA-C  PRIMARY REASON FOR VISIT:  1. Malignant neoplasm of upper-inner quadrant of left breast in female, estrogen receptor positive ([place])   2. Family history of breast cancer      HISTORY OF PRESENT ILLNESS:   Ms. Whitmire, a 40 y.o. female, was seen for a [place] cancer genetics consultation at the request of Dr. Janese Banks due to a personal and family history of breast cancer.  Ms. Territo presents to clinic today to discuss the possibility of a hereditary predisposition to cancer, genetic testing, and to further clarify her future cancer risks, as well as potential cancer risks for family members.   In 2022, at the age of 83, Ms. Dowda was diagnosed with invasive mammary carcinoma of the left breast, ER/PR+, Her2-. The treatment plan includes lumpectomy planned for the week after Thanksgiving, possible chemotherapy, adjuvant radiation and adjuvant endocrine therapy.  CANCER HISTORY:  Oncology History  Malignant neoplasm of upper-inner quadrant of left breast in female, estrogen receptor positive ([place])  04/02/2021 Initial Diagnosis   Malignant neoplasm of upper-inner quadrant of left breast in female, estrogen receptor positive ([place])   04/02/2021 Cancer Staging   Staging form: Breast, AJCC 8th Edition - Clinical stage from 04/02/2021: Stage IA (cT1c, cN0, cM0, G3, ER+, PR+, HER2-) - Signed by Sindy Guadeloupe, MD on 04/02/2021 Histologic grading system: 3 grade system       RISK FACTORS:  Menarche was at age 38.  Menopausal status: premenopausal.  HRT use: 0 years.  Past Medical History:  Diagnosis Date   Asthma    Family history of breast cancer    History of migraine headaches    Abstract from medicat   Uterine myoma     Past Surgical History:  Procedure Laterality Date   NASAL SINUS SURGERY Bilateral 1998   corrected nasal septum   UTERINE FIBROID  SURGERY  2005   in [place],horrible blood loss; not cancerous, "just a growth" some  being watched by OB/GYN.    Social History   Socioeconomic History   Marital status: Single    Spouse name: Not on file   Number of children: Not on file   Years of education: 18   Highest education level: Not on file  Occupational History   Occupation: professor  Tobacco Use   Smoking status: Never   Smokeless tobacco: Never  Vaping Use   Vaping Use: Never used  Substance and Sexual Activity   Alcohol use: No    Comment: less than once a week a drink   Drug use: No   Sexual activity: Not Currently    Birth control/protection: Inserts  Other Topics Concern   Not on file  Social History Narrative   Not on file   Social Determinants of Health   Financial Resource Strain: Not on file  Food Insecurity: Not on file  Transportation Needs: Not on file  Physical Activity: Not on file  Stress: Not on file  Social Connections: Not on file     FAMILY HISTORY:  We obtained a detailed, 4-generation family history.  Significant diagnoses are listed below: Family History  Problem Relation Age of Onset   Endometriosis Mother    Hypertension Mother    Depression Mother    Migraines Mother    Cancer Mother        brain tumors   Skin cancer Father  Hypertension Father    Crohn's disease Father    Asthma Father    Breast cancer Paternal Grandmother    Endometriosis Sister    Depression Sister    Asthma Brother    Hip fracture Paternal Grandfather    Ms. Averitt has 1 brother, 2 sisters, no cancers.  Ms. Ganus mother has had skin cancer, possibly melanoma. She is living at 62. She is an only child. Maternal grandparents passed in their 57s.  Ms. Borman father is 28 and has also had skin cancer. Patient has 1 paternal aunts, 1 paternal uncle, no cancers. No known cancers in paternal cousins. Paternal grandfather died in his 45s, grandmother had breast cancer in her 3s and died in her  56s.  Ms. Bellucci is unaware of previous family history of genetic testing for hereditary cancer risks. Patient's maternal ancestors are of European descent, and paternal ancestors are of European descent. There is no reported Ashkenazi Jewish ancestry. There is no known consanguinity.   GENETIC COUNSELING ASSESSMENT: Ms. Chandran is a 40 y.o. female with a personal and family history of breast cancer which is somewhat suggestive of a hereditary cancer syndrome and predisposition to cancer. We, therefore, discussed and recommended the following at today's visit.   DISCUSSION: We discussed that approximately 10% of breast cancer is hereditary. Most cases of hereditary breast cancer are associated with BRCA1/BRCA2 genes, although there are other genes associated with hereditary breast cancer as well. Cancers and risks are gene specific.  We discussed that testing is beneficial for several reasons including surgical decision-making for breast cancer, knowing about other cancer risks, identifying potential screening and risk-reduction options that may be appropriate, and to understand if other family members could be at risk for cancer and allow them to undergo genetic testing.   We reviewed the characteristics, features and inheritance patterns of hereditary cancer syndromes. We also discussed genetic testing, including the appropriate family members to test, the process of testing, insurance coverage and turn-around-time for results. We discussed the implications of a negative, positive and/or variant of uncertain significant result. Ms. Weinheimer had saliva testing through Dr. Dwyane Luo office for the [facility].   Based on Ms. Bally's personal and family history of cancer, she meets medical criteria for genetic testing. Despite that she meets criteria, she may still have an out of pocket cost. We discussed that if her out of pocket cost for testing is over $100, the laboratory will call and confirm  whether she wants to proceed with testing.  If the out of pocket cost of testing is less than $100 she will be billed by the genetic testing laboratory.   PLAN: Ms. Crotteau genetic testing is pending at [facility] for the Multi-Cancer Panel. Results should be available within approximately 1-2 weeks' time, at which point they will be disclosed by telephone to Ms. Haggart, as will any additional recommendations warranted by these results. Ms. Terhaar will receive a summary of her genetic counseling visit and a copy of her results once available. This information will also be available in Epic.   Ms. Catalina questions were answered to her satisfaction today. Our contact information was provided should additional questions or concerns arise. Thank you for the referral and allowing [place] to share in the care of your patient.   Faith Rogue, MS, [facility] Genetic Counselor [email or web address] Phone: [phone]  The patient was seen for a total of 10 minutes in face-to-face genetic counseling.  Patient was seen alone. [facility] intern Raj Janus was present.  Dr. Grayland Ormond was available for discussion regarding this case.   _______________________________________________________________________ For Office Staff:  Number of people involved in session: 1 Was an Intern/ student involved with case: yes

## 2021-04-04 ENCOUNTER — Other Ambulatory Visit: Payer: Self-pay

## 2021-04-04 ENCOUNTER — Other Ambulatory Visit: Payer: Self-pay | Admitting: Nurse Practitioner

## 2021-04-04 ENCOUNTER — Telehealth: Payer: Self-pay | Admitting: *Deleted

## 2021-04-04 ENCOUNTER — Telehealth: Payer: Self-pay

## 2021-04-04 DIAGNOSIS — Z170 Estrogen receptor positive status [ER+]: Secondary | ICD-10-CM

## 2021-04-04 DIAGNOSIS — C50212 Malignant neoplasm of upper-inner quadrant of left female breast: Secondary | ICD-10-CM

## 2021-04-04 DIAGNOSIS — Z23 Encounter for immunization: Secondary | ICD-10-CM

## 2021-04-04 MED ORDER — PNEUMOCOCCAL VAC POLYVALENT 25 MCG/0.5ML IJ INJ: 0.5000 mL | INJECTION | Freq: Once | INTRAMUSCULAR | 0 refills | Status: DC

## 2021-04-04 NOTE — Telephone Encounter (Signed)
Called pt to let her know that we put in ref to see [facility] for surgey and oncology. We called back today and asked how long to get appt for pt. It is urgent. Staff states that it is at least 1 month- was told that urgent or reg. Will not be able to do better than 1 month. Spoke to [employer] and she states that if it is going to be 79monthfor appt that she suggests that pt go ahead with surgery with Dr. BBary Castillaand then hopefully after surgery and recovery time that  we will get appt for second opinion. The surgery is after the genetics test has been finalized.  I have reached out to CVictory Gardensthat works at [employer]and see if we have date to have results esp. The BRCA test. Waiting to hear back. Pt. Is agreeable to the above. She does [facility] feel [facility]

## 2021-04-04 NOTE — Telephone Encounter (Signed)
Pt's second opinion to [facility] was faxed to [phone] spoke to [facility] who provided fax number and stated the fax number is used for both medical and surgical breast cancer oncology. Receieved a fax confirmation.

## 2021-04-04 NOTE — Telephone Encounter (Signed)
Error

## 2021-04-07 ENCOUNTER — Other Ambulatory Visit: Payer: Self-pay | Admitting: General Surgery

## 2021-04-07 DIAGNOSIS — E559 Vitamin D deficiency, unspecified: Secondary | ICD-10-CM

## 2021-04-07 DIAGNOSIS — C50212 Malignant neoplasm of upper-inner quadrant of left female breast: Secondary | ICD-10-CM

## 2021-04-07 DIAGNOSIS — D219 Benign neoplasm of connective and other soft tissue, unspecified: Secondary | ICD-10-CM

## 2021-04-09 ENCOUNTER — Telehealth: Payer: Self-pay | Admitting: *Deleted

## 2021-04-09 ENCOUNTER — Other Ambulatory Visit: Payer: Self-pay | Admitting: General Surgery

## 2021-04-09 NOTE — Telephone Encounter (Signed)
[facility] (sp) lab needs order for RNA samples they received of rthis patient.

## 2021-04-09 NOTE — Progress Notes (Signed)
Subjective:     Patient ID: Madison Sullivan is a 40 y.o. female.   HPI   The following portions of the patient's history were reviewed and updated as appropriate.   This a new patient is here today for: office visit. Patient is here today for evaluation of a recently diagnosed left breast cancer.     The patient reports that about a month ago she became aware of a fullness in the upper aspect of the left breast.  While she does not do scheduled breast exams, she does do occasional breast self examinations, and was not aware of any abnormality prior to this.  She scheduled a visit with her gynecologist who arranged for mammograms and she subsequently underwent a biopsy on March 21, 2021 with identification of breast cancer.   No history of breast trauma.   The patient reports she had significant uterine fibroids and under went laparoscopic excision in the past with significant bleeding.  She had already decided against childbearing.   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   Patient does report a family history of breast cancer in her paternal grandmother.    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    The patient reports her bra size is 34 A.        Chief Complaint  Patient presents with   Treatment Plan Discussion      BP 118/70   Pulse 90   Temp 36.4 C (97.6 F)   Ht 175.3 cm (5' 9" )   Wt 69.9 kg (154 lb)   LMP 03/03/2021 (Exact Date)   SpO2 96%   BMI 22.74 kg/m        Past Medical History:  Diagnosis Date   Asthma without status asthmaticus, unspecified     Benign tumor of uterus      myomatous    Breast cancer (CMS-HCC) 03/21/2021    left   Bronchitis     Ear infection     Migraine headache             Past Surgical History:  Procedure Laterality Date   nose surgery   1998   ultrasound guided core breast biopsy Left  03/21/2021   uterine fibroids   2005                OB History     Gravida  0   Para  0   Term  0   Preterm  0   AB  0   Living  0      SAB  0   IAB  0   Ectopic  0   Molar  0   Multiple  0   Live Births  0        Obstetric Comments  Age at first period 89               Social History           Socioeconomic History   Marital status: Single  Tobacco Use   Smoking status: Never   Smokeless tobacco: Never  Vaping Use   Vaping Use: Never used  Substance and Sexual Activity   Alcohol use: Yes      Comment: twice per month   Drug use: No   Sexual activity: Not Currently  Allergies  Allergen Reactions   Asprin Ec Low Dose [Aspirin] Shortness Of Breath      Asthmatic   Iodine Rash      Current Medications        Current Outpatient Medications  Medication Sig Dispense Refill   albuterol 90 mcg/actuation inhaler Inhale 2 inhalations into the lungs every 6 (six) hours as needed for Wheezing.       baclofen (LIORESAL) 10 MG tablet Take 10 mg by mouth 2 (two) times daily       etodolac (LODINE) 400 MG tablet Take 1 tablet (400 mg total) by mouth 2 (two) times daily 60 tablet 11   etonogestrel-ethinyl estradiol (NUVARING) 0.12-0.015 mg/24 hr vaginal ring Insert vaginally and leave in place for 3 consecutive weeks, then remove for 1 week.       loratadine (CLARITIN) 10 mg tablet Take 10 mg by mouth once daily.       magnesium oxide (MAG-OX) 400 mg (241.3 mg magnesium) tablet Take 400 mg by mouth once daily       montelukast (SINGULAIR) 10 mg tablet Take 10 mg by mouth nightly       ondansetron (ZOFRAN) 8 MG tablet Take 1 tablet (8 mg total) by mouth every 8 (eight) hours as needed for Nausea 20 tablet 0   promethazine (PHENERGAN) 25 MG tablet Take 1 tablet (25 mg total) by mouth every 6 (six) hours as needed for Nausea 30 tablet 0   rizatriptan (MAXALT-MLT) 10 MG disintegrating tablet         SUMAtriptan (IMITREX) 100 MG tablet Take 1  tablet (100 mg total) by mouth as directed for Migraine May take a second dose after 2 hours if needed. (Patient not taking: Reported on 03/27/2021) 10 tablet 6    No current facility-administered medications for this visit.             Family History  Problem Relation Age of Onset   Skin cancer Mother     Cancer Mother          brain tumors   Endometriosis Mother     High blood pressure (Hypertension) Mother     Migraines Mother     Depression Mother     Asthma Father     Skin cancer Father     Crohn's disease Father     Cancer Father     High blood pressure (Hypertension) Father     Migraines Sister     Endometriosis Sister     Depression Sister     Asthma Brother     Breast cancer Paternal Grandmother     Hip fracture Paternal Grandfather     Colon cancer Neg Hx          Labs and Radiology:      Mammogram and ultrasound of January 07, 2021:    There is a partly circumscribed oval mass in the upper left breast, proximally 1.6 cm in long axis, corresponding to the palpable abnormality.   There are no other masses, no areas of architectural distortion and no suspicious calcifications.   On physical exam, there is a firm palpable, mobile, mass in the upper left breast between 11 and 12 o'clock.   Targeted ultrasound is performed, showing a hypoechoic mass in the left breast at 11 o'clock, 6 cm the nipple. Mass has partly circumscribed partly ill-defined margins and measures 1.7 x 1.3 x 1.7 cm. Internal blood flow is noted on color Doppler analysis. This mass corresponds to  the palpable abnormality.   Sonographic evaluation of the left axilla shows several lymph nodes with prominent cortices measuring up to 5 mm in thickness. All nodes are normal in overall size with well maintained hilar fat. These images were independently reviewed.   This study was completed 1 week after she received one of the COVID injections in the left arm.  The patient was not aware of any  local reaction to the injection.   Ultrasound of the left axilla completed March 27, 2021:   Scanning through the axilla shows only 1 dominant lymph node with a significantly thickened cortex.  This measured 0.64 x 1.52 x 1.55 cm.  The cortex measured 3-4 mm in thickness.  No additional lymph nodes were identified to the level of the axillary vein.   Pathology of March 21, 2021:   A. BREAST, LEFT 11:00 6 CM FN; ULTRASOUND-GUIDED BIOPSY:  - INVASIVE MAMMARY CARCINOMA, NO SPECIAL TYPE, WITH OSTEOCLAST-LIKE  STROMAL GIANT CELLS.    Size of invasive carcinoma: 12 mm in this sample  Histologic grade of invasive carcinoma: Grade 3                       Glandular/tubular differentiation score: 3                       Nuclear pleomorphism score: 2                       Mitotic rate score: 3                       Total score: 8  Ductal carcinoma in situ: Present, high-grade  Lymphovascular invasion: Focus suspicious for    Receptor status was not available at the time of the patient's visit.  This was 6 days after the study was completed.  Special request was forwarded to the pathologist and attempt to expedite results from [place].   Review of Systems  Constitutional: Negative for chills and fever.  Respiratory: Negative for cough.          Objective:   Physical Exam Exam conducted with a chaperone present.  Constitutional:      Appearance: Normal appearance.  Cardiovascular:     Rate and Rhythm: Normal rate and regular rhythm.     Pulses: Normal pulses.     Heart sounds: Normal heart sounds.  Pulmonary:     Effort: Pulmonary effort is normal.     Breath sounds: Normal breath sounds.  Chest:    Musculoskeletal:     Cervical back: Neck supple.  Lymphadenopathy:     Upper Body:     Right upper body: No supraclavicular or axillary adenopathy.     Left upper body: Axillary adenopathy (mildly prominent node low in the axilla) present. No supraclavicular adenopathy.  Skin:     General: Skin is warm and dry.  Neurological:     Mental Status: She is alert and oriented to person, place, and time.  Psychiatric:        Mood and Affect: Mood normal.        Behavior: Behavior normal.           Assessment:     Newly diagnosed left breast cancer.    Plan:     A good deal of the visit today was spent working around the various possibilities based around the pathology that is known, the pending receptor  status and whether all of the lymphadenopathy, markedly improved on serial ultrasounds, was related to her prior COVID-19 injection.  The decision was made to biopsy the only remaining prominent lymph node today in an attempt to clarify this issue.  The fact that at least 5 normal nodes with thickened cortices were noted 18 days ago and only 1 remains just, hopefully this is mainly related to an injection site response.   The patient was amenable to a core biopsy of the prominent left lower axillary node.  The area was cleansed with ChloraPrep and a total of 10 cc of 0.5% Xylocaine was 0.25% Marcaine with 1: 200,000 units of epinephrine was utilized and well-tolerated.  The area was then recleansed and draped.  An 11 blade was used to make a incision posterior to the lesion.  A 14-gauge Bard biopsy device was utilized and 4 core samples obtained.  Slight transient discomfort with 1 pass of the needle.  At the completion of the procedure a coil clip was placed in the lymph node.  Scant bleeding.  No evidence of hematoma formation.  The skin defect was closed with benzoin, Steri-Strips, Telfa and Tegaderm.  The procedure was well-tolerated.  Ice pack provided.   Based on the tumor size estimated at 1.7 cm she would not likely be a candidate for neo--adjuvant chemotherapy unless triple negative, and even with a positive lymph node surgical therapy would likely be appropriate if ER/PR positive.   Based on her young age genetic testing was recommended and accepted and a sample has  been sent to [facility].  While the patient has no children, she does have 2 sisters.   BRCA positivity would not mandate mastectomy, but certainly might be in the equation.   After the patient had left the office and receptor status became available by phone: ER/PR positive, HER2/neu not overexpressed.  She will likely require upfront surgery.   Arrangements have been made for evaluation by medical oncology with Madison Evens, MD on April 03, 2021.   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  Should be contacted by phone when pathology results are available.  She was encouraged to use ice is much as possible through the day to minimize axillary swelling.  Tylenol if needed for soreness.    This note is partially prepared by Ledell Noss, CMA acting as a scribe in the presence of Dr. Hervey Ard, MD.      The documentation recorded by the scribe accurately reflects the service I personally performed and the decisions made by me.    Robert Bellow, MD FACS     November 17,.2022:  Plan:     The majority of today's visit was spent reviewing options for management.  Breast conservation and mastectomy were presented is equivalent procedures.  The patient has a "B" cup breast, and the tumor mass is near the junction of the middle and distal thirds of the breast "doughnut".  Anticipate modest volume reduction with removal.  Indications for sentinel node biopsy and the technique was discussed.  National average of positive margins requiring reexploration runs about 15%.  Every effort will be made to have this be a "one-stop procedure".   Activity restrictions post surgery were reviewed.  Her brother is coming in from [place] to visit for Thanksgiving and will be her caregiver in the immediate postoperative period.  No anticipation for the need for drains.  Role of the compressive wrap discussed.   The  patient is aware the postoperative radiation therapy  is indicated with breast conservation to minimize risk of local recurrence.   If indeed she remains node-negative, Oncotype DX testing will be obtained.  With her "grade 3" tumor and her premenopausal status, there is a concern about a more aggressive tumor.  This is offset by the ER/PR positive status of the tumor itself.   The patient was concerned that Dr. Janese Sullivan will be traveling out of country beginning in early December.  I reassured the patient that there will be "people" to carry the ball in her absence.   She does plan to send letters to the hospital and pathology department regarding the long delay in obtaining her receptor status.      This note is partially prepared by Ledell Noss, CMA acting as a scribe in the presence of Dr. Hervey Ard, MD.    The documentation recorded by the scribe accurately reflects the service I personally performed and the decisions made by me.    Robert Bellow, MD FACS

## 2021-04-09 NOTE — Telephone Encounter (Signed)
Denton Ar- what sample is this about? Are we working off on salivary sample orblood?

## 2021-04-10 ENCOUNTER — Inpatient Hospital Stay: Admission: RE | Admit: 2021-04-10 | Payer: BC Managed Care – PPO | Source: Ambulatory Visit

## 2021-04-10 ENCOUNTER — Encounter: Payer: Self-pay | Admitting: General Surgery

## 2021-04-11 ENCOUNTER — Other Ambulatory Visit
Admission: RE | Admit: 2021-04-11 | Discharge: 2021-04-11 | Disposition: A | Discharge status: Home / Self Care | Payer: BC Managed Care – PPO | Source: Ambulatory Visit | Attending: General Surgery | Admitting: General Surgery

## 2021-04-11 ENCOUNTER — Other Ambulatory Visit: Payer: Self-pay

## 2021-04-11 HISTORY — DX: Malignant (primary) neoplasm, unspecified: C80.1

## 2021-04-11 NOTE — Patient Instructions (Signed)
Your procedure is scheduled on: Monday April 16, 2021. Report to Radiology irst as instructed by your doctor inside [facility], then go to Day Surgery inside [facility] 2nd floor.  To find out your arrival time please call [phone] as instructed by your provider.  Remember: Instructions that are not followed completely may result in serious medical risk,  up to and including death, or upon the discretion of your surgeon and anesthesiologist your  surgery may need to be rescheduled.     _X__ 1. Do not eat food after midnight the night before your procedure.                 No chewing gum or hard candies. You may drink clear liquids up to 2 hours                 before you are scheduled to arrive for your surgery- DO not drink clear                 liquids within 2 hours of the start of your surgery.                 Clear Liquids include:  water, apple juice without pulp, clear Gatorade, G2 or                  Gatorade Zero (avoid Red/Purple/Blue), Black Coffee or Tea (Do not add                 anything to coffee or tea).  __X__2.  On the morning of surgery brush your teeth with toothpaste and water, you                may rinse your mouth with mouthwash if you wish.  Do not swallow any toothpaste or mouthwash.     _X__ 3.  No Alcohol for 24 hours before or after surgery.   _X__ 4.  Do Not Smoke or use e-cigarettes For 24 Hours Prior to Your Surgery.                 Do not use any chewable tobacco products for at least 6 hours prior to                 Surgery.  _X__  5.  Do not use any recreational drugs (marijuana, cocaine, heroin, ecstasy, MDMA or other)                For at least one week prior to your surgery.  Combination of these drugs with anesthesia                May have life threatening results.  __X__  6  Notify your doctor if there is any change in your medical condition      (cold, fever, infections).     Do not wear jewelry, make-up,  hairpins, clips or nail polish. Do not wear lotions, powders, perfumes or deodorant. Do not shave 48 hours prior to surgery. Men may shave face and neck. Do not bring valuables to the hospital.    [facility] is not responsible for any belongings or valuables.  Contacts, dentures or bridgework may not be worn into surgery. Leave your suitcase in the car. After surgery it may be brought to your room. For patients admitted to the hospital, discharge time is determined by your treatment team.   Patients discharged the day of surgery will not be allowed to drive home.  Make arrangements for someone to be with you for the first 24 hours of your Same Day Discharge.   __X__ Take these medicines the morning of surgery with A SIP OF WATER:    1. loratadine (CLARITIN) 10 MG  2. montelukast (SINGULAIR) 10 MG  3.   4.  5.  6.  ____ Fleet Enema (as directed)   __X__ Use CHG Soap (or wipes) as directed  ____ Use Benzoyl Peroxide Gel as instructed  __X__ Use inhalers on the day of surgery  ____ Stop metformin 2 days prior to surgery    ____ Take 1/2 of usual insulin dose the night before surgery. No insulin the morning          of surgery.   ____ Call your PCP, cardiologist, or Pulmonologist if taking Coumadin/Plavix/aspirin and ask when to stop before your surgery.   __X__ One Week prior to surgery- Stop Anti-inflammatories such as Ibuprofen, Aleve, Advil, Motrin, meloxicam (MOBIC), diclofenac, etodolac, ketorolac, Toradol, Daypro, piroxicam, Goody's or BC powders. OK TO USE TYLENOL IF NEEDED   __X__ Stop supplements until after surgery.    ____ Bring C-Pap to the hospital.    If you have any questions regarding your pre-procedure instructions,  Please call Pre-admit Testing at [phone].

## 2021-04-16 ENCOUNTER — Other Ambulatory Visit: Payer: Self-pay | Admitting: Obstetrics & Gynecology

## 2021-04-16 ENCOUNTER — Ambulatory Visit
Admission: RE | Admit: 2021-04-16 | Discharge: 2021-04-16 | Disposition: A | Discharge status: Home / Self Care | Payer: BC Managed Care – PPO | Source: Ambulatory Visit | Attending: General Surgery | Admitting: General Surgery

## 2021-04-16 ENCOUNTER — Encounter
Admission: RE | Disposition: A | Discharge status: Home / Self Care | Payer: Self-pay | Source: Home / Self Care | Attending: General Surgery

## 2021-04-16 ENCOUNTER — Ambulatory Visit: Payer: BC Managed Care – PPO | Admitting: Certified Registered"

## 2021-04-16 ENCOUNTER — Other Ambulatory Visit: Payer: Self-pay

## 2021-04-16 ENCOUNTER — Encounter: Payer: Self-pay | Admitting: General Surgery

## 2021-04-16 ENCOUNTER — Ambulatory Visit
Admission: RE | Admit: 2021-04-16 | Discharge: 2021-04-16 | Disposition: A | Discharge status: Home / Self Care | Payer: BC Managed Care – PPO | Attending: General Surgery | Admitting: General Surgery

## 2021-04-16 ENCOUNTER — Ambulatory Visit
Admission: RE | Admit: 2021-04-16 | Discharge: 2021-04-16 | Disposition: A | Discharge status: Home / Self Care | Payer: BC Managed Care – PPO | Source: Home / Self Care | Attending: General Surgery | Admitting: General Surgery

## 2021-04-16 DIAGNOSIS — C50212 Malignant neoplasm of upper-inner quadrant of left female breast: Secondary | ICD-10-CM | POA: Diagnosis not present

## 2021-04-16 DIAGNOSIS — Z170 Estrogen receptor positive status [ER+]: Secondary | ICD-10-CM | POA: Diagnosis not present

## 2021-04-16 DIAGNOSIS — N63 Unspecified lump in unspecified breast: Secondary | ICD-10-CM

## 2021-04-16 DIAGNOSIS — N6320 Unspecified lump in the left breast, unspecified quadrant: Secondary | ICD-10-CM

## 2021-04-16 DIAGNOSIS — J45909 Unspecified asthma, uncomplicated: Secondary | ICD-10-CM | POA: Diagnosis not present

## 2021-04-16 HISTORY — PX: BREAST LUMPECTOMY: SHX2

## 2021-04-16 HISTORY — PX: ADJACENT TISSUE TRANSFER/TISSUE REARRANGEMENT: SHX6829

## 2021-04-16 HISTORY — PX: BREAST LUMPECTOMY WITH SENTINEL LYMPH NODE BIOPSY: SHX5597

## 2021-04-16 LAB — POCT PREGNANCY, URINE: Preg Test, Ur: NEGATIVE

## 2021-04-16 SURGERY — BREAST LUMPECTOMY WITH SENTINEL LYMPH NODE BX
Anesthesia: General | Site: Breast | Laterality: Left

## 2021-04-16 MED ORDER — DEXMEDETOMIDINE HCL IN NACL 400 MCG/100ML IV SOLN
INTRAVENOUS | Status: DC | PRN
  Administered 2021-04-16: 4 ug via INTRAVENOUS
  Administered 2021-04-16 (×2): 8 ug via INTRAVENOUS

## 2021-04-16 MED ORDER — CHLORHEXIDINE GLUCONATE 0.12 % MT SOLN
OROMUCOSAL | Status: AC
  Administered 2021-04-16: 09:00:00 15 mL via OROMUCOSAL
  Filled 2021-04-16: qty 15

## 2021-04-16 MED ORDER — LIDOCAINE HCL (CARDIAC) PF 100 MG/5ML IV SOSY
PREFILLED_SYRINGE | INTRAVENOUS | Status: DC | PRN
  Administered 2021-04-16: 60 mg via INTRAVENOUS

## 2021-04-16 MED ORDER — SEVOFLURANE IN SOLN
RESPIRATORY_TRACT | Status: AC
  Filled 2021-04-16: qty 250

## 2021-04-16 MED ORDER — ONDANSETRON HCL 4 MG/2ML IJ SOLN
INTRAMUSCULAR | Status: DC | PRN
  Administered 2021-04-16: 4 mg via INTRAVENOUS

## 2021-04-16 MED ORDER — FENTANYL CITRATE (PF) 100 MCG/2ML IJ SOLN
INTRAMUSCULAR | Status: AC
  Administered 2021-04-16: 12:00:00 25 ug via INTRAVENOUS
  Filled 2021-04-16: qty 2

## 2021-04-16 MED ORDER — TECHNETIUM TC 99M TILMANOCEPT KIT
1.1160 | PACK | Freq: Once | INTRAVENOUS | Status: AC
  Administered 2021-04-16: 1.116 via INTRADERMAL

## 2021-04-16 MED ORDER — SCOPOLAMINE 1 MG/3DAYS TD PT72
MEDICATED_PATCH | TRANSDERMAL | Status: AC
  Filled 2021-04-16: qty 1

## 2021-04-16 MED ORDER — ACETAMINOPHEN 10 MG/ML IV SOLN
INTRAVENOUS | Status: DC | PRN
  Administered 2021-04-16: 1000 mg via INTRAVENOUS

## 2021-04-16 MED ORDER — KETOROLAC TROMETHAMINE 30 MG/ML IJ SOLN
INTRAMUSCULAR | Status: DC | PRN
  Administered 2021-04-16: 15 mg via INTRAVENOUS

## 2021-04-16 MED ORDER — FAMOTIDINE 20 MG PO TABS
ORAL_TABLET | ORAL | Status: AC
  Administered 2021-04-16: 09:00:00 20 mg via ORAL
  Filled 2021-04-16: qty 1

## 2021-04-16 MED ORDER — BUPIVACAINE-EPINEPHRINE (PF) 0.5% -1:200000 IJ SOLN
INTRAMUSCULAR | Status: DC | PRN
  Administered 2021-04-16: 30 mL

## 2021-04-16 MED ORDER — ORAL CARE MOUTH RINSE: 15.0000 mL | Freq: Once | OROMUCOSAL | Status: AC

## 2021-04-16 MED ORDER — CEFAZOLIN SODIUM-DEXTROSE 2-3 GM-%(50ML) IV SOLR
INTRAVENOUS | Status: DC | PRN
  Administered 2021-04-16: 2 g via INTRAVENOUS

## 2021-04-16 MED ORDER — PROPOFOL 10 MG/ML IV BOLUS
INTRAVENOUS | Status: AC
  Filled 2021-04-16: qty 20

## 2021-04-16 MED ORDER — SCOPOLAMINE 1 MG/3DAYS TD PT72
MEDICATED_PATCH | TRANSDERMAL | Status: DC | PRN
  Administered 2021-04-16: 1 via TRANSDERMAL

## 2021-04-16 MED ORDER — ONDANSETRON HCL 4 MG/2ML IJ SOLN
INTRAMUSCULAR | Status: AC
  Filled 2021-04-16: qty 2

## 2021-04-16 MED ORDER — HYDROMORPHONE HCL 1 MG/ML IJ SOLN
INTRAMUSCULAR | Status: AC
  Filled 2021-04-16: qty 1

## 2021-04-16 MED ORDER — PROMETHAZINE HCL 25 MG/ML IJ SOLN: 6.2500 mg | INTRAMUSCULAR | Status: DC | PRN

## 2021-04-16 MED ORDER — HYDROMORPHONE HCL 1 MG/ML IJ SOLN
INTRAMUSCULAR | Status: DC | PRN
  Administered 2021-04-16: 1 mg via INTRAVENOUS

## 2021-04-16 MED ORDER — PROMETHAZINE HCL 25 MG/ML IJ SOLN
INTRAMUSCULAR | Status: AC
  Administered 2021-04-16: 13:00:00 6.25 mg via INTRAVENOUS
  Filled 2021-04-16: qty 1

## 2021-04-16 MED ORDER — CEFAZOLIN SODIUM-DEXTROSE 2-4 GM/100ML-% IV SOLN
INTRAVENOUS | Status: AC
  Filled 2021-04-16: qty 100

## 2021-04-16 MED ORDER — ACETAMINOPHEN 10 MG/ML IV SOLN
INTRAVENOUS | Status: AC
  Filled 2021-04-16: qty 100

## 2021-04-16 MED ORDER — MIDAZOLAM HCL 2 MG/2ML IJ SOLN
INTRAMUSCULAR | Status: DC | PRN
  Administered 2021-04-16: 2 mg via INTRAVENOUS

## 2021-04-16 MED ORDER — MIDAZOLAM HCL 2 MG/2ML IJ SOLN
INTRAMUSCULAR | Status: AC
  Filled 2021-04-16: qty 2

## 2021-04-16 MED ORDER — LACTATED RINGERS IV SOLN: INTRAVENOUS | Status: DC

## 2021-04-16 MED ORDER — PHENYLEPHRINE HCL (PRESSORS) 10 MG/ML IV SOLN
INTRAVENOUS | Status: DC | PRN
  Administered 2021-04-16 (×2): 80 ug via INTRAVENOUS

## 2021-04-16 MED ORDER — CHLORHEXIDINE GLUCONATE CLOTH 2 % EX PADS: 6.0000 | MEDICATED_PAD | Freq: Once | CUTANEOUS | Status: DC

## 2021-04-16 MED ORDER — FENTANYL CITRATE (PF) 100 MCG/2ML IJ SOLN
25.0000 ug | INTRAMUSCULAR | Status: DC | PRN
  Administered 2021-04-16 (×3): 25 ug via INTRAVENOUS

## 2021-04-16 MED ORDER — ARTIFICIAL TEARS OPHTHALMIC OINT
TOPICAL_OINTMENT | OPHTHALMIC | Status: AC
  Filled 2021-04-16: qty 3.5

## 2021-04-16 MED ORDER — METHYLENE BLUE 0.5 % INJ SOLN
INTRAVENOUS | Status: DC | PRN
  Administered 2021-04-16: 3 mL via SUBMUCOSAL

## 2021-04-16 MED ORDER — ONDANSETRON HCL 4 MG/2ML IJ SOLN
4.0000 mg | Freq: Once | INTRAMUSCULAR | Status: AC | PRN
  Administered 2021-04-16: 12:00:00 4 mg via INTRAVENOUS

## 2021-04-16 MED ORDER — METHYLENE BLUE 0.5 % INJ SOLN
INTRAVENOUS | Status: AC
  Filled 2021-04-16: qty 10

## 2021-04-16 MED ORDER — KETOROLAC TROMETHAMINE 30 MG/ML IJ SOLN: INTRAMUSCULAR | Status: DC | PRN

## 2021-04-16 MED ORDER — FAMOTIDINE 20 MG PO TABS: 20.0000 mg | ORAL_TABLET | Freq: Once | ORAL | Status: AC

## 2021-04-16 MED ORDER — FENTANYL CITRATE (PF) 100 MCG/2ML IJ SOLN
INTRAMUSCULAR | Status: DC | PRN
  Administered 2021-04-16 (×2): 50 ug via INTRAVENOUS

## 2021-04-16 MED ORDER — SODIUM CHLORIDE 0.9 % IV SOLN
6.2500 mg | INTRAVENOUS | Status: DC | PRN
  Filled 2021-04-16: qty 0.25

## 2021-04-16 MED ORDER — BUPIVACAINE-EPINEPHRINE (PF) 0.5% -1:200000 IJ SOLN
INTRAMUSCULAR | Status: AC
  Filled 2021-04-16: qty 30

## 2021-04-16 MED ORDER — CHLORHEXIDINE GLUCONATE 0.12 % MT SOLN: 15.0000 mL | Freq: Once | OROMUCOSAL | Status: AC

## 2021-04-16 MED ORDER — STERILE WATER FOR IRRIGATION IR SOLN
Status: DC | PRN
  Administered 2021-04-16: 1000 mL

## 2021-04-16 MED ORDER — FENTANYL CITRATE (PF) 100 MCG/2ML IJ SOLN
INTRAMUSCULAR | Status: AC
  Filled 2021-04-16: qty 2

## 2021-04-16 MED ORDER — DEXAMETHASONE SODIUM PHOSPHATE 10 MG/ML IJ SOLN
INTRAMUSCULAR | Status: DC | PRN
  Administered 2021-04-16: 10 mg via INTRAVENOUS

## 2021-04-16 MED ORDER — PROPOFOL 10 MG/ML IV BOLUS
INTRAVENOUS | Status: DC | PRN
  Administered 2021-04-16: 100 mg via INTRAVENOUS
  Administered 2021-04-16: 200 mg via INTRAVENOUS

## 2021-04-16 MED ORDER — PROMETHAZINE HCL 25 MG PO TABS: 25.0000 mg | ORAL_TABLET | Freq: Four times a day (QID) | ORAL | 0 refills | Status: DC | PRN

## 2021-04-16 SURGICAL SUPPLY — 59 items
APL PRP STRL LF DISP 70% ISPRP (MISCELLANEOUS) ×1
BINDER BREAST MEDIUM (GAUZE/BANDAGES/DRESSINGS) ×2 IMPLANT
BLADE BOVIE TIP EXT 4 (BLADE) ×2 IMPLANT
BLADE SURG 15 STRL SS SAFETY (BLADE) ×4 IMPLANT
BULB RESERV EVAC DRAIN JP 100C (MISCELLANEOUS) IMPLANT
CHLORAPREP W/TINT 26 (MISCELLANEOUS) ×2 IMPLANT
CNTNR SPEC 2.5X3XGRAD LEK (MISCELLANEOUS)
CONT SPEC 4OZ STER OR WHT (MISCELLANEOUS)
CONT SPEC 4OZ STRL OR WHT (MISCELLANEOUS)
CONTAINER SPEC 2.5X3XGRAD LEK (MISCELLANEOUS) IMPLANT
COVER PROBE FLX POLY STRL (MISCELLANEOUS) ×2 IMPLANT
DEVICE DUBIN SPECIMEN MAMMOGRA (MISCELLANEOUS) ×2 IMPLANT
DRAIN CHANNEL JP 15F RND 16 (MISCELLANEOUS) IMPLANT
DRAPE LAPAROTOMY TRNSV 106X77 (MISCELLANEOUS) ×2 IMPLANT
DRSG GAUZE FLUFF 36X18 (GAUZE/BANDAGES/DRESSINGS) ×4 IMPLANT
DRSG TELFA 3X8 NADH (GAUZE/BANDAGES/DRESSINGS) ×2 IMPLANT
ELECT CAUTERY BLADE TIP 2.5 (TIP) ×2
ELECT REM PT RETURN 9FT ADLT (ELECTROSURGICAL) ×2
ELECTRODE CAUTERY BLDE TIP 2.5 (TIP) ×1 IMPLANT
ELECTRODE REM PT RTRN 9FT ADLT (ELECTROSURGICAL) ×1 IMPLANT
GAUZE 4X4 16PLY ~~LOC~~+RFID DBL (SPONGE) ×2 IMPLANT
GLOVE SURG ENC MOIS LTX SZ7.5 (GLOVE) ×2 IMPLANT
GLOVE SURG UNDER LTX SZ8 (GLOVE) ×2 IMPLANT
GOWN STRL REUS W/ TWL LRG LVL3 (GOWN DISPOSABLE) ×2 IMPLANT
GOWN STRL REUS W/TWL LRG LVL3 (GOWN DISPOSABLE) ×4
KIT MARKER MARGIN INK (KITS) ×2 IMPLANT
KIT TURNOVER KIT A (KITS) ×2 IMPLANT
LABEL OR SOLS (LABEL) ×2 IMPLANT
MANIFOLD NEPTUNE II (INSTRUMENTS) ×2 IMPLANT
MARGIN MAP 10MM (MISCELLANEOUS) ×2 IMPLANT
NEEDLE HYPO 22GX1.5 SAFETY (NEEDLE) ×2 IMPLANT
NEEDLE HYPO 25X1 1.5 SAFETY (NEEDLE) ×4 IMPLANT
NEEDLE SPNL 20GX3.5 QUINCKE YW (NEEDLE) IMPLANT
PACK BASIN MINOR ARMC (MISCELLANEOUS) ×2 IMPLANT
PENCIL ELECTRO HAND CTR (MISCELLANEOUS) ×2 IMPLANT
RETRACTOR RING XSMALL (MISCELLANEOUS) ×1 IMPLANT
RTRCTR WOUND ALEXIS 13CM XS SH (MISCELLANEOUS) ×2
SHEARS FOC LG CVD HARMONIC 17C (MISCELLANEOUS) IMPLANT
SHEARS HARMONIC 9CM CVD (BLADE) ×2 IMPLANT
SLEVE PROBE SENORX GAMMA FIND (MISCELLANEOUS) ×2 IMPLANT
STRIP CLOSURE SKIN 1/2X4 (GAUZE/BANDAGES/DRESSINGS) ×2 IMPLANT
SUT ETHILON 3-0 FS-10 30 BLK (SUTURE) ×2
SUT SILK 2 0 (SUTURE) ×2
SUT SILK 2-0 18XBRD TIE 12 (SUTURE) ×1 IMPLANT
SUT VIC AB 2-0 CT1 (SUTURE) ×2 IMPLANT
SUT VIC AB 2-0 CT1 27 (SUTURE) ×6
SUT VIC AB 2-0 CT1 TAPERPNT 27 (SUTURE) ×3 IMPLANT
SUT VIC AB 3-0 SH 27 (SUTURE) ×2
SUT VIC AB 3-0 SH 27X BRD (SUTURE) ×1 IMPLANT
SUT VIC AB 4-0 FS2 27 (SUTURE) ×4 IMPLANT
SUT VICRYL+ 3-0 144IN (SUTURE) IMPLANT
SUTURE EHLN 3-0 FS-10 30 BLK (SUTURE) ×1 IMPLANT
SWABSTK COMLB BENZOIN TINCTURE (MISCELLANEOUS) ×2 IMPLANT
SYR 10ML LL (SYRINGE) ×2 IMPLANT
SYR BULB IRRIG 60ML STRL (SYRINGE) ×2 IMPLANT
TAPE TRANSPORE STRL 2 31045 (GAUZE/BANDAGES/DRESSINGS) ×2 IMPLANT
TRAP NEPTUNE SPECIMEN COLLECT (MISCELLANEOUS) ×2 IMPLANT
WATER STERILE IRR 1000ML POUR (IV SOLUTION) ×2 IMPLANT
WATER STERILE IRR 500ML POUR (IV SOLUTION) ×2 IMPLANT

## 2021-04-16 NOTE — Anesthesia Postprocedure Evaluation (Signed)
Anesthesia Post Note  Patient: Madison Sullivan  Procedure(s) Performed: BREAST LUMPECTOMY WITH SENTINEL LYMPH NODE BX (Left: Breast)  Anesthesia Type: General Anesthetic complications: no   No notable events documented.   Last Vitals:  Vitals:   04/16/21 0901  BP: (!) 143/100  Pulse: 84  Resp: 16  Temp: (!) 36.3 C  SpO2: 97%    Last Pain:  Vitals:   04/16/21 0901  TempSrc: Temporal  PainSc: 0-No pain                 Kerri Perches

## 2021-04-16 NOTE — Discharge Instructions (Signed)
AMBULATORY SURGERY  ?DISCHARGE INSTRUCTIONS ? ? ?The drugs that you were given will stay in your system until tomorrow so for the next 24 hours you should not: ? ?Drive an automobile ?Make any legal decisions ?Drink any alcoholic beverage ? ? ?You may resume regular meals tomorrow.  Today it is better to start with liquids and gradually work up to solid foods. ? ?You may eat anything you prefer, but it is better to start with liquids, then soup and crackers, and gradually work up to solid foods. ? ? ?Please notify your doctor immediately if you have any unusual bleeding, trouble breathing, redness and pain at the surgery site, drainage, fever, or pain not relieved by medication. ? ? ? ?Additional Instructions: ? ? ? ?Please contact your physician with any problems or Same Day Surgery at [phone], Monday through Friday 6 am to 4 pm, or [facility] [place] Main number at [phone].  ?

## 2021-04-16 NOTE — H&P (Signed)
Madison Sullivan [id number] 09/23/1980     HPI: 40 y/o woman with recently diagnosed T1c,N0 carcinoma of the left breast.  Desires breast conservation. Tolerated SLN injection well.   Medications Prior to Admission  Medication Sig Dispense Refill Last Dose   acetaminophen (TYLENOL) 325 MG tablet Take 650 mg by mouth every 6 (six) hours as needed for moderate pain.   Past Week   Acetaminophen-Caffeine (TENSION HEADACHE) 500-65 MG TABS Take 2 tablets by mouth 2 (two) times daily as needed (HEADACHE).   Past Week   albuterol (PROVENTIL HFA;VENTOLIN HFA) 108 (90 Base) MCG/ACT inhaler Inhale 2 puffs into the lungs every 6 (six) hours as needed for wheezing or shortness of breath.   Past Month   etonogestrel-ethinyl estradiol (NUVARING) 0.12-0.015 MG/24HR vaginal ring Insert vaginally and leave in place for 4 consecutive weeks, then replace with new ring. 3 each 4 Past Month   fluticasone (FLONASE) 50 MCG/ACT nasal spray Place 2 sprays into both nostrils daily as needed for allergies.   04/07/2021   loratadine (CLARITIN) 10 MG tablet Take 10 mg by mouth daily.   04/15/2021   montelukast (SINGULAIR) 10 MG tablet TAKE 1 TABLET(10 MG) BY MOUTH AT BEDTIME 90 tablet 1 04/15/2021   Multiple Vitamins-Minerals (MULTIVITAMIN ADULT) CHEW Chew 2 each by mouth daily.   Past Week   Omega-3 Fatty Acids (FISH OIL) 1000 MG CAPS Take 3,000 mg by mouth daily.   Past Week   Pseudoephedrine HCl (SUDAFED PO) Take 10 mg by mouth.   Past Month   sodium chloride (OCEAN) 0.65 % SOLN nasal spray Place 1 spray into both nostrils as needed for congestion. 1 Bottle 6 Past Week   Allergies  Allergen Reactions   Aspirin Shortness Of Breath    Avoids it due to her asthma   Iodine Rash   Past Medical History:  Diagnosis Date   Asthma    Cancer ([place])    Family history of breast cancer    History of migraine headaches    Abstract from medicat   Uterine myoma    Past Surgical History:  Procedure Laterality Date   NASAL SINUS  SURGERY Bilateral 1998   corrected nasal septum in [place]   UTERINE FIBROID SURGERY  2005   in [place],horrible blood loss; not cancerous, "just a growth" some  being watched by OB/GYN.   Social History   Socioeconomic History   Marital status: Single    Spouse name: Not on file   Number of children: Not on file   Years of education: 18   Highest education level: Not on file  Occupational History   Occupation: professor  Tobacco Use   Smoking status: Never   Smokeless tobacco: Never  Vaping Use   Vaping Use: Never used  Substance and Sexual Activity   Alcohol use: No    Comment: less than three mos   Drug use: No   Sexual activity: Not Currently    Birth control/protection: Inserts  Other Topics Concern   Not on file  Social History Narrative   Not on file   Social Determinants of Health   Financial Resource Strain: Not on file  Food Insecurity: Not on file  Transportation Needs: Not on file  Physical Activity: Not on file  Stress: Not on file  Social Connections: Not on file  Intimate Partner Violence: Not on file   Social History   Social History Narrative   Not on file     ROS: Negative.  PE: HEENT: Negative. Lungs: Clear. Cardio: RR.  Assessment/Plan:  Proceed with planned left breast wide excision, SLN exam.   Forest Gleason [facility] 04/16/2021

## 2021-04-16 NOTE — Transfer of Care (Signed)
Immediate Anesthesia Transfer of Care Note  Patient: Madison Sullivan  Procedure(s) Performed: BREAST LUMPECTOMY WITH SENTINEL LYMPH NODE BX (Left: Breast)  Patient Location: PACU  Anesthesia Type:General  Level of Consciousness: drowsy  Airway & Oxygen Therapy: Patient Spontanous Breathing  Post-op Assessment: Report given to RN  Post vital signs: stable  Last Vitals:  Vitals Value Taken Time  BP    Temp    Pulse    Resp    SpO2      Last Pain:  Vitals:   04/16/21 0901  TempSrc: Temporal  PainSc: 0-No pain      Patients Stated Pain Goal: 0 (32/95/18 8416)  Complications: No notable events documented.

## 2021-04-16 NOTE — Anesthesia Preprocedure Evaluation (Signed)
Anesthesia Evaluation  Patient identified by MRN, date of birth, ID band Patient awake    Reviewed: Allergy & Precautions, NPO status , Patient's Chart, lab work & pertinent test results  History of Anesthesia Complications (+) PONV  Airway Mallampati: II  TM Distance: >3 FB Neck ROM: full    Dental  (+) Teeth Intact   Pulmonary neg pulmonary ROS,    Pulmonary exam normal breath sounds clear to auscultation       Cardiovascular Exercise Tolerance: Good negative cardio ROS Normal cardiovascular exam Rhythm:Regular Rate:Normal     Neuro/Psych negative neurological ROS  negative psych ROS   GI/Hepatic negative GI ROS, Neg liver ROS,   Endo/Other  negative endocrine ROS  Renal/GU negative Renal ROS  negative genitourinary   Musculoskeletal   Abdominal Normal abdominal exam  (+)   Peds negative pediatric ROS (+)  Hematology negative hematology ROS (+)   Anesthesia Other Findings Past Medical History: No date: Asthma No date: Cancer ([place]) No date: Family history of breast cancer No date: History of migraine headaches     Comment:  Abstract from medicat 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  Past Surgical History: 1998: NASAL SINUS SURGERY; Bilateral     Comment:  corrected nasal septum in [place] 2005: UTERINE FIBROID SURGERY     Comment:  in [place],horrible blood loss; not cancerous, "just a               growth" some  being watched by OB/GYN.  BMI    Body Mass Index: 22.43 kg/m      Reproductive/Obstetrics negative OB ROS                             Anesthesia Physical Anesthesia Plan  ASA: 2  Anesthesia Plan: General   Post-op Pain Management:    Induction: Intravenous  PONV Risk Score and Plan: 2 and Ondansetron and Dexamethasone  Airway Management Planned: LMA  Additional Equipment:   Intra-op Plan:   Post-operative Plan:   Informed Consent: I have reviewed the patients  History and Physical, chart, labs and discussed the procedure including the risks, benefits and alternatives for the proposed anesthesia with the patient or authorized representative who has indicated his/her understanding and acceptance.     Dental Advisory Given  Plan Discussed with: Anesthesiologist, CRNA and Surgeon  Anesthesia Plan Comments:         Anesthesia Quick Evaluation

## 2021-04-16 NOTE — Anesthesia Procedure Notes (Addendum)
Procedure Name: LMA Insertion Date/Time: 04/16/2021 9:57 AM Performed by: Kerri Perches, CRNA Pre-anesthesia Checklist: Patient identified, Patient being monitored, Timeout performed, Emergency Drugs available and Suction available Patient Re-evaluated:Patient Re-evaluated prior to induction Oxygen Delivery Method: Circle system utilized Preoxygenation: Pre-oxygenation with 100% oxygen Induction Type: IV induction LMA: LMA inserted LMA Size: 3.0 Tube type: Oral Number of attempts: 1 Placement Confirmation: positive ETCO2 and breath sounds checked- equal and bilateral Tube secured with: Tape Dental Injury: Teeth and Oropharynx as per pre-operative assessment

## 2021-04-16 NOTE — Anesthesia Postprocedure Evaluation (Signed)
Anesthesia Post Note  Patient: Madison Sullivan  Procedure(s) Performed: BREAST LUMPECTOMY WITH SENTINEL LYMPH NODE BX (Left: Breast) ADJACENT TISSUE TRANSFER/TISSUE REARRANGEMENT (Left: Breast)  Patient location during evaluation: PACU Anesthesia Type: General Level of consciousness: awake and awake and alert Pain management: pain level controlled Vital Signs Assessment: post-procedure vital signs reviewed and stable Cardiovascular status: blood pressure returned to baseline Anesthetic complications: no   No notable events documented.   Last Vitals:  Vitals:   04/16/21 1145 04/16/21 1200  BP: 112/69 110/76  Pulse: 71 88  Resp: 12 19  Temp: 36.7 C   SpO2: 94% 100%    Last Pain:  Vitals:   04/16/21 1145  TempSrc:   PainSc: Asleep                 VAN STAVEREN,Quinnie Barcelo

## 2021-04-16 NOTE — Op Note (Signed)
Preoperative diagnosis: Left upper inner quadrant breast cancer.  Postoperative diagnosis: Same.  Operative procedure: Ultrasound-guided left breast wide excision with tissue transfer, sentinel node biopsy.  Operating Surgeon: Hervey Ard, MD.  Assistant: Elsie Stain, RNFA.  Estimated blood loss: Less than 5 cc.  Clinical note: This 40 year old woman with a recent identified with breast cancer.  She had multiple enlarged axillary nodes thought secondary to prior vaccine injections.  Core biopsy of the largest was negative for metastatic disease.  She desired breast conservation.  She is brought to the operating at this time having undergone injection with technetium sulfur colloid prior to the procedure.  Operative note: The patient underwent general anesthesia and tolerated this well.  The breast was cleansed with alcohol and 3 cc of 0.5% methylene blue was injected in subareolar plexus.  Ultrasound was used to outline the dominant tumor mass (photo for chart) and there was a additional finding of a 1 cm mass just inferior and medial to the dominant mass adjacent to the pectoralis fascia that was elected to be included in the resected specimen.  This created somewhat of a wedge shape area for resection of centered on the 11 o'clock position.  A pectoralis block was completed with 0.5% Marcaine with 1 to 200,000 units of epinephrine in the similar local anesthetic was used for the skin adjacent to the tumor site.  A curvilinear incision in the upper inner quadrant of the breast was made overlying the tumor mass.  As it was fairly close to the dermis thin flaps were elevated circumferentially for a distance of about 4 cm.  A block of tissue including the pectoralis fashion as the posterior border was excised, pain was used for orientation and radiograph confirmed the previously placed clip.  While the breast specimen was being processed attention was turned to the axilla.  High counts were noted  near the base of the axilla corresponding area of previous node biopsy.  A small skin line incision was made here and carried down skin subtendinous tissue with hemostasis achieved electrocautery.  Multiple blue lymphatics were identified and multiple hot, blue lymph nodes with counts up to 5000 were identified.  A total of 5 nodes were removed.  Good hemostasis was noted with 3-0 Vicryl for the apex of the nodal pedicle.  The axillary envelope was closed with a 2-0 Vicryl figure-of-eight suture and the adipose layer was closed in a similar fashion.  The skin was closed with a running 4-0 Vicryl subcuticular suture.  With the verbal report that the margins were grossly clear attention was turned towards closing this generous 15 cm defect.  The breast was elevated off the underlying pectoralis muscle circumferentially for a distance of about 7 cm.  This allowed the deep tissue to be approximated with interrupted 2-0 Vicryl figure-of-eight sutures to obliterate all of the previously created dead space.  The superficial adipose tissue was approximated with interrupted 2-0 Vicryl sutures.  The deep dermal layer was approximated with interrupted 2-0 Vicryl simple sutures and the skin closed with a running 4-0 Vicryl subcuticular suture.  It was necessary to elevate the skin flaps of the superior medially and laterally to eliminate "bulge" with soft tissue closure.  With the dense breast tissue, there was little concern for impaired vascularity of the breast pedicle.  Benzoin and Steri-Strips followed by Telfa, fluff gauze and a compressive wrap were applied.  The patient tolerated the procedure well and was taken to recovery in stable condition.

## 2021-04-17 ENCOUNTER — Encounter: Payer: Self-pay | Admitting: General Surgery

## 2021-04-18 ENCOUNTER — Other Ambulatory Visit: Payer: Self-pay | Admitting: General Surgery

## 2021-04-18 LAB — SURGICAL PATHOLOGY

## 2021-04-19 ENCOUNTER — Telehealth: Payer: Self-pay | Admitting: *Deleted

## 2021-04-19 ENCOUNTER — Telehealth: Payer: Self-pay

## 2021-04-19 NOTE — Telephone Encounter (Signed)
I called the patient to let her know that since her pathology came back with negative lymph nodes that she wanted to proceed with Oncotype testing.  The patient already remembered that Dr. Janese Banks had spoke to her about that she is good to do the Oncotype testing.  We sent it off to Oncotype today.  I told her I would let her know when I have a date that they give me that they will have the answers and let her know.  She is agreeable to the plan.

## 2021-04-19 NOTE — Telephone Encounter (Signed)
Oncotype was sent today.

## 2021-04-25 ENCOUNTER — Inpatient Hospital Stay: Payer: BC Managed Care – PPO | Admitting: Hospice and Palliative Medicine

## 2021-04-25 ENCOUNTER — Encounter: Payer: Self-pay | Admitting: General Surgery

## 2021-04-25 ENCOUNTER — Other Ambulatory Visit: Payer: Self-pay

## 2021-04-26 NOTE — Progress Notes (Deleted)
Error

## 2021-04-27 ENCOUNTER — Telehealth: Payer: Self-pay

## 2021-04-27 NOTE — Telephone Encounter (Signed)
Reached out in regards to the message she left asking if we knew when her oncotype results would result. I informed pt we are expecting it around 12/19 which is after her [facility] consult. Pt understands that it is an estimated date; we will reach back out once results are back with next steps. Pt agrees with plan.

## 2021-05-02 ENCOUNTER — Other Ambulatory Visit: Payer: Self-pay | Admitting: General Surgery

## 2021-05-02 ENCOUNTER — Encounter: Payer: Self-pay | Admitting: Oncology

## 2021-05-02 DIAGNOSIS — C50212 Malignant neoplasm of upper-inner quadrant of left female breast: Secondary | ICD-10-CM

## 2021-05-02 DIAGNOSIS — Z170 Estrogen receptor positive status [ER+]: Secondary | ICD-10-CM

## 2021-05-02 NOTE — Progress Notes (Signed)
Rad ong

## 2021-05-03 ENCOUNTER — Other Ambulatory Visit: Payer: Self-pay | Admitting: *Deleted

## 2021-05-07 ENCOUNTER — Encounter: Payer: Self-pay | Admitting: Radiation Oncology

## 2021-05-07 ENCOUNTER — Ambulatory Visit
Admission: RE | Admit: 2021-05-07 | Discharge: 2021-05-07 | Disposition: A | Discharge status: Home / Self Care | Payer: BC Managed Care – PPO | Source: Ambulatory Visit | Attending: Radiation Oncology | Admitting: Radiation Oncology

## 2021-05-07 ENCOUNTER — Other Ambulatory Visit: Payer: Self-pay

## 2021-05-07 VITALS — BP 129/85 | HR 91 | Temp 98.1°F | Wt 154.4 lb

## 2021-05-07 DIAGNOSIS — C50212 Malignant neoplasm of upper-inner quadrant of left female breast: Secondary | ICD-10-CM | POA: Diagnosis not present

## 2021-05-07 DIAGNOSIS — Z803 Family history of malignant neoplasm of breast: Secondary | ICD-10-CM | POA: Insufficient documentation

## 2021-05-07 DIAGNOSIS — Z79899 Other long term (current) drug therapy: Secondary | ICD-10-CM | POA: Insufficient documentation

## 2021-05-07 DIAGNOSIS — C773 Secondary and unspecified malignant neoplasm of axilla and upper limb lymph nodes: Secondary | ICD-10-CM | POA: Insufficient documentation

## 2021-05-07 DIAGNOSIS — J45909 Unspecified asthma, uncomplicated: Secondary | ICD-10-CM | POA: Insufficient documentation

## 2021-05-07 DIAGNOSIS — Z170 Estrogen receptor positive status [ER+]: Secondary | ICD-10-CM | POA: Insufficient documentation

## 2021-05-07 NOTE — Consult Note (Signed)
NEW PATIENT EVALUATION  Name: Madison Sullivan  MRN: [id number]  Date:   05/07/2021     DOB: Apr 10, 1981   This 40 y.o. 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  REFERRING PHYSICIAN: Talmage Nap, P*  CHIEF COMPLAINT:  Chief Complaint  Patient presents with   Breast Cancer    DIAGNOSIS: The encounter diagnosis was Malignant neoplasm of upper-inner quadrant of left breast in female, estrogen receptor positive ([place]).   PREVIOUS INVESTIGATIONS:  Mammogram and ultrasound reviewed Clinical notes reviewed Pathology report reviewed  HPI: Patient is a 40 year old female who presented presented with a self discovered mass in her left breast.  Mammogram and ultrasound confirmed a 1.7 sonometer mass at 11 o'clock position 6 cm from the nipple.  Several left axillar lymph nodes were prominent although she had recently had COVID-vaccine.She had initial ultrasound-guided biopsy showing invasive mammary carcinoma no special type with osteoclast like stromal giant cells.  She went on to have a wide local excision and left axillary lymph node dissection based on her initial abnormal lymph nodes.  Tumor was 3.5 x 2.5 x 1.9 cm.  Thus ductal carcinoma was present.  Tumor was overall grade 2 ER/PR positive HER2/neu not overexpressed.  Margins were clear at 3 mm for both invasive component as well as DCIS component.  She had 12 lymph nodes removed all were negative for metastatic disease.  She had Oncotype DX performed showing recurrence score of 14 showing a less than 1% benefit to chemotherapy.  She is now referred to [facility] for consideration of treatment.  She is doing well.  She specifically denies breast tenderness cough or bone pain.  She is having some numbness secondary to her axillary lymph node dissection.   In her left upper extremity.  No swelling or evidence of lymphedema is noted.  PLANNED TREATMENT REGIMEN: Hypofractionated left whole breast radiation  PAST MEDICAL HISTORY:  has a past medical history of Asthma, Cancer ([place]), Family history of breast cancer, History of migraine headaches, and Uterine myoma.    PAST SURGICAL HISTORY:  Past Surgical History:  Procedure Laterality Date   ADJACENT TISSUE TRANSFER/TISSUE REARRANGEMENT Left 04/16/2021   Procedure: ADJACENT TISSUE TRANSFER/TISSUE REARRANGEMENT;  Surgeon: Robert Bellow, MD;  Location: [facility] ORS;  Service: General;  Laterality: Left;   BREAST LUMPECTOMY WITH SENTINEL LYMPH NODE BIOPSY Left 04/16/2021   Procedure: BREAST LUMPECTOMY WITH SENTINEL LYMPH NODE BX;  Surgeon: Robert Bellow, MD;  Location: [facility] ORS;  Service: General;  Laterality: Left;   NASAL SINUS SURGERY Bilateral 1998   corrected nasal septum in [place]   UTERINE FIBROID SURGERY  2005   in [place],horrible blood loss; not cancerous, "just a growth" some  being watched by OB/GYN.    FAMILY HISTORY: family history includes Asthma in her brother and father; Breast cancer in her paternal grandmother; Cancer in her mother; Crohn's disease in her father; Depression in her mother and sister; Endometriosis in her mother and sister; Hip fracture in her paternal grandfather; Hypertension in her father and mother; Migraines in her mother; Skin cancer in her father.  SOCIAL HISTORY:  reports that she has never smoked. She has never used smokeless tobacco. She reports that she does not drink alcohol and does not use drugs.  ALLERGIES: Aspirin and Iodine  MEDICATIONS:  Current Outpatient Medications  Medication  Sig Dispense Refill   acetaminophen (TYLENOL) 325 MG tablet Take 650 mg by mouth every 6 (six) hours as needed for moderate pain.     Acetaminophen-Caffeine (TENSION HEADACHE) 500-65 MG TABS Take 2 tablets by mouth 2 (two) times daily as needed (HEADACHE).      albuterol (PROVENTIL HFA;VENTOLIN HFA) 108 (90 Base) MCG/ACT inhaler Inhale 2 puffs into the lungs every 6 (six) hours as needed for wheezing or shortness of breath.     etonogestrel-ethinyl estradiol (NUVARING) 0.12-0.015 MG/24HR vaginal ring Insert vaginally and leave in place for 4 consecutive weeks, then replace with new ring. 3 each 4   fluticasone (FLONASE) 50 MCG/ACT nasal spray Place 2 sprays into both nostrils daily as needed for allergies.     loratadine (CLARITIN) 10 MG tablet Take 10 mg by mouth daily.     montelukast (SINGULAIR) 10 MG tablet TAKE 1 TABLET(10 MG) BY MOUTH AT BEDTIME 90 tablet 1   Multiple Vitamins-Minerals (MULTIVITAMIN ADULT) CHEW Chew 2 each by mouth daily.     Omega-3 Fatty Acids (FISH OIL) 1000 MG CAPS Take 3,000 mg by mouth daily.     promethazine (PHENERGAN) 25 MG tablet Take 1 tablet (25 mg total) by mouth every 6 (six) hours as needed for nausea or vomiting. 20 tablet 0   Pseudoephedrine HCl (SUDAFED PO) Take 10 mg by mouth.     sodium chloride (OCEAN) 0.65 % SOLN nasal spray Place 1 spray into both nostrils as needed for congestion. 1 Bottle 6   No current facility-administered medications for this encounter.    ECOG PERFORMANCE STATUS:  0 - Asymptomatic  REVIEW OF SYSTEMS: Patient denies any weight loss, fatigue, weakness, fever, chills or night sweats. Patient denies any loss of vision, blurred vision. Patient denies any ringing  of the ears or hearing loss. No irregular heartbeat. Patient denies heart murmur or history of fainting. Patient denies any chest pain or pain radiating to her upper extremities. Patient denies any shortness of breath, difficulty breathing at night, cough or hemoptysis. Patient denies any swelling in the lower legs. Patient denies any nausea vomiting, vomiting of blood, or coffee ground material in the vomitus. Patient denies any stomach pain. Patient states has had normal bowel movements no significant constipation or diarrhea.  Patient denies any dysuria, hematuria or significant nocturia. Patient denies any problems walking, swelling in the joints or loss of balance. Patient denies any skin changes, loss of hair or loss of weight. Patient denies any excessive worrying or anxiety or significant depression. Patient denies any problems with insomnia. Patient denies excessive thirst, polyuria, polydipsia. Patient denies any swollen glands, patient denies easy bruising or easy bleeding. Patient denies any recent infections, allergies or URI. Patient "s visual fields have not changed significantly in recent time.   PHYSICAL EXAM: BP 129/85    Pulse 91    Temp 98.1 F (36.7 C) (Tympanic)    Wt 154 lb 6.4 oz (70 kg)    BMI 22.80 kg/m  She is status post wide local excision as well as axillary lymph node dissection with incisions both healing well.  No dominant masses noted in either breast.  No axillary or supraclavicular adenopathy is identified.  Well-developed well-nourished patient in NAD. HEENT reveals PERLA, EOMI, discs not visualized.  Oral cavity is clear. No oral mucosal lesions are identified. Neck is clear without evidence of cervical or supraclavicular adenopathy. Lungs are clear to A&P. Cardiac examination is essentially unremarkable with regular rate and rhythm without murmur rub or  thrill. Abdomen is benign with no organomegaly or masses noted. Motor sensory and DTR levels are equal and symmetric in the upper and lower extremities. Cranial nerves II through XII are grossly intact. Proprioception is intact. No peripheral adenopathy or edema is identified. No motor or sensory levels are noted. Crude visual fields are within normal range.  LABORATORY DATA: Cytology and pathology report reviewed    RADIOLOGY RESULTS: Mammograms and ultrasound reviewed compatible with above-stated findings   IMPRESSION: Stage IIa invasive mammary carcinoma of the left breast status post wide local excision and axillary lymph node  dissection in 40 year old female  PLAN: At this time I recommended a hypofractionated course of whole breast radiation over 3 weeks.  Would also boost her scar another 1000 cGy using electron beam.  Risks and benefits of treatment including skin reaction fatigue alteration of blood counts possible inclusion of superficial lung all were reviewed in detail with the patient.  She seems to comprehend my treatment plan well.  I personally set up and ordered CT simulation for later this week.  Patient also will benefit from aromatase inhibitor after completion of radiation.  I would like to take this opportunity to thank you for allowing me to participate in the care of your patient.Noreene Filbert, MD

## 2021-05-09 ENCOUNTER — Ambulatory Visit
Admission: RE | Admit: 2021-05-09 | Discharge: 2021-05-09 | Disposition: A | Discharge status: Home / Self Care | Payer: BC Managed Care – PPO | Source: Ambulatory Visit | Attending: Radiation Oncology | Admitting: Radiation Oncology

## 2021-05-09 DIAGNOSIS — C50212 Malignant neoplasm of upper-inner quadrant of left female breast: Secondary | ICD-10-CM | POA: Diagnosis present

## 2021-05-09 DIAGNOSIS — C773 Secondary and unspecified malignant neoplasm of axilla and upper limb lymph nodes: Secondary | ICD-10-CM | POA: Insufficient documentation

## 2021-05-09 DIAGNOSIS — Z170 Estrogen receptor positive status [ER+]: Secondary | ICD-10-CM | POA: Diagnosis not present

## 2021-05-11 ENCOUNTER — Other Ambulatory Visit: Payer: Self-pay | Admitting: *Deleted

## 2021-05-11 DIAGNOSIS — C50212 Malignant neoplasm of upper-inner quadrant of left female breast: Secondary | ICD-10-CM

## 2021-05-15 ENCOUNTER — Telehealth: Payer: Self-pay | Admitting: *Deleted

## 2021-05-15 NOTE — Progress Notes (Signed)
Spoke with patient and got her scheduled on 05/28/21.

## 2021-05-15 NOTE — Telephone Encounter (Signed)
Sorry for this long date for this pt. I called and spoke to her on 05/02/2021 at home on my day off and I was out for several days of vacation but I did talk with Dr. Bary Castilla as well as Dr. Janese Banks the oncotype score was 14 and she did not need chemo and she would go to see radiation and Dr. Bary Castilla had already put in referral to radiation and pt was so happy to get the news

## 2021-05-17 ENCOUNTER — Ambulatory Visit: Admission: RE | Admit: 2021-05-17 | Payer: BC Managed Care – PPO | Source: Ambulatory Visit

## 2021-05-17 DIAGNOSIS — C50212 Malignant neoplasm of upper-inner quadrant of left female breast: Secondary | ICD-10-CM | POA: Diagnosis not present

## 2021-05-22 ENCOUNTER — Ambulatory Visit
Admission: RE | Admit: 2021-05-22 | Discharge: 2021-05-22 | Disposition: A | Discharge status: Home / Self Care | Payer: BC Managed Care – PPO | Source: Ambulatory Visit | Attending: Radiation Oncology | Admitting: Radiation Oncology

## 2021-05-22 ENCOUNTER — Ambulatory Visit: Payer: BC Managed Care – PPO

## 2021-05-22 DIAGNOSIS — C773 Secondary and unspecified malignant neoplasm of axilla and upper limb lymph nodes: Secondary | ICD-10-CM | POA: Diagnosis not present

## 2021-05-22 DIAGNOSIS — C50212 Malignant neoplasm of upper-inner quadrant of left female breast: Secondary | ICD-10-CM | POA: Insufficient documentation

## 2021-05-22 DIAGNOSIS — Z170 Estrogen receptor positive status [ER+]: Secondary | ICD-10-CM | POA: Diagnosis not present

## 2021-05-23 ENCOUNTER — Ambulatory Visit
Admission: RE | Admit: 2021-05-23 | Discharge: 2021-05-23 | Disposition: A | Discharge status: Home / Self Care | Payer: BC Managed Care – PPO | Source: Ambulatory Visit | Attending: Radiation Oncology | Admitting: Radiation Oncology

## 2021-05-23 ENCOUNTER — Inpatient Hospital Stay: Payer: BC Managed Care – PPO | Admitting: Hospice and Palliative Medicine

## 2021-05-23 ENCOUNTER — Ambulatory Visit: Payer: BC Managed Care – PPO

## 2021-05-23 ENCOUNTER — Other Ambulatory Visit: Payer: Self-pay

## 2021-05-23 DIAGNOSIS — C50212 Malignant neoplasm of upper-inner quadrant of left female breast: Secondary | ICD-10-CM | POA: Diagnosis not present

## 2021-05-23 DIAGNOSIS — Z170 Estrogen receptor positive status [ER+]: Secondary | ICD-10-CM | POA: Insufficient documentation

## 2021-05-23 NOTE — Progress Notes (Signed)
Multidisciplinary Oncology Council Documentation  Madison Sullivan was presented by our [facility] on 05/23/2021, which included representatives from:  Palliative Care Dietitian  Physical/Occupational Therapist Nurse Navigator Genetics Speech Therapist Social work Survivorship RN Financial Navigator Research RN   Madison Sullivan currently presents with history of breast cancer.  We reviewed previous medical and familial history, history of present illness, and recent lab results along with all available histopathologic and imaging studies. The [place] considered available treatment options and made the following recommendations/referrals:  -consider referral to [facility] with rehab services following recent breast surgery.  The MOC is a meeting of clinicians from various specialty areas who evaluate and discuss patients for whom a multidisciplinary approach is being considered. Final determinations in the plan of care are those of the provider(s).   Today's extended care, comprehensive team conference, Madison Sullivan was not present for the discussion and was not examined.

## 2021-05-24 ENCOUNTER — Ambulatory Visit
Admission: RE | Admit: 2021-05-24 | Discharge: 2021-05-24 | Disposition: A | Discharge status: Home / Self Care | Payer: BC Managed Care – PPO | Source: Ambulatory Visit | Attending: Radiation Oncology | Admitting: Radiation Oncology

## 2021-05-24 ENCOUNTER — Ambulatory Visit: Payer: BC Managed Care – PPO

## 2021-05-24 DIAGNOSIS — C50212 Malignant neoplasm of upper-inner quadrant of left female breast: Secondary | ICD-10-CM | POA: Diagnosis not present

## 2021-05-25 ENCOUNTER — Ambulatory Visit: Payer: BC Managed Care – PPO

## 2021-05-25 ENCOUNTER — Ambulatory Visit
Admission: RE | Admit: 2021-05-25 | Discharge: 2021-05-25 | Disposition: A | Discharge status: Home / Self Care | Payer: BC Managed Care – PPO | Source: Ambulatory Visit | Attending: Radiation Oncology | Admitting: Radiation Oncology

## 2021-05-25 DIAGNOSIS — C50212 Malignant neoplasm of upper-inner quadrant of left female breast: Secondary | ICD-10-CM | POA: Diagnosis not present

## 2021-05-28 ENCOUNTER — Encounter: Payer: Self-pay | Admitting: Oncology

## 2021-05-28 ENCOUNTER — Ambulatory Visit: Payer: BC Managed Care – PPO | Admitting: Obstetrics and Gynecology

## 2021-05-28 ENCOUNTER — Encounter: Payer: Self-pay | Admitting: Obstetrics and Gynecology

## 2021-05-28 ENCOUNTER — Other Ambulatory Visit: Payer: Self-pay | Admitting: *Deleted

## 2021-05-28 ENCOUNTER — Other Ambulatory Visit: Payer: Self-pay

## 2021-05-28 ENCOUNTER — Ambulatory Visit
Admission: RE | Admit: 2021-05-28 | Discharge: 2021-05-28 | Disposition: A | Discharge status: Home / Self Care | Payer: BC Managed Care – PPO | Source: Ambulatory Visit | Attending: Radiation Oncology | Admitting: Radiation Oncology

## 2021-05-28 ENCOUNTER — Ambulatory Visit: Payer: BC Managed Care – PPO

## 2021-05-28 ENCOUNTER — Inpatient Hospital Stay: Payer: BC Managed Care – PPO | Admitting: Oncology

## 2021-05-28 VITALS — BP 138/84 | Ht 69.0 in | Wt 157.0 lb

## 2021-05-28 VITALS — BP 136/86 | HR 78 | Temp 97.2°F | Resp 16 | Ht 69.0 in | Wt 153.8 lb

## 2021-05-28 DIAGNOSIS — N939 Abnormal uterine and vaginal bleeding, unspecified: Secondary | ICD-10-CM

## 2021-05-28 DIAGNOSIS — Z170 Estrogen receptor positive status [ER+]: Secondary | ICD-10-CM

## 2021-05-28 DIAGNOSIS — D251 Intramural leiomyoma of uterus: Secondary | ICD-10-CM

## 2021-05-28 DIAGNOSIS — C50212 Malignant neoplasm of upper-inner quadrant of left female breast: Secondary | ICD-10-CM | POA: Diagnosis not present

## 2021-05-28 DIAGNOSIS — C50919 Malignant neoplasm of unspecified site of unspecified female breast: Secondary | ICD-10-CM

## 2021-05-28 DIAGNOSIS — Z7189 Other specified counseling: Secondary | ICD-10-CM

## 2021-05-28 MED ORDER — TAMOXIFEN CITRATE 20 MG PO TABS: 20.0000 mg | ORAL_TABLET | Freq: Every day | ORAL | 3 refills | Status: DC

## 2021-05-28 NOTE — Progress Notes (Signed)
Obstetrics & Gynecology Office Visit   Chief Complaint:  Chief Complaint  Patient presents with   Contraception    Hx Breast cancer, discuss OPC options. 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 Bary Castilla undergoing XRT.  She has a history of AUB-L previously treated with Nuvaring, as well as prior myomectomy in [place] in 2005.  Given her recent breast cancer status she is not a candidate to continue estrogen/progestin containing products for management of her AUB-L.  She is also concerned about Tamoxifen causing growth in her uterine fibroids and had a discussion regarding ovarian suppression and aromatase inhibitor with her oncologist.  Sh presents today to discuss treatment options going forward for her fibroids and menorrhagia.  Review of Systems: Review of Systems  Constitutional: Negative.   Gastrointestinal: Negative.   Genitourinary: Negative.     Past Medical History:  Past Medical History:  Diagnosis Date   Asthma    Cancer ([place])    Family history of breast cancer    History of migraine headaches    Abstract from [facility]   Uterine myoma     Past Surgical History:  Past Surgical History:  Procedure Laterality Date   ADJACENT TISSUE TRANSFER/TISSUE REARRANGEMENT Left 04/16/2021   Procedure: ADJACENT TISSUE TRANSFER/TISSUE REARRANGEMENT;  Surgeon: Robert Bellow, MD;  Location: [facility] ORS;  Service: General;  Laterality: Left;   BREAST LUMPECTOMY WITH SENTINEL LYMPH NODE BIOPSY Left 04/16/2021   Procedure: BREAST LUMPECTOMY WITH SENTINEL LYMPH NODE BX;  Surgeon: Robert Bellow, MD;  Location: [facility] ORS;  Service: General;  Laterality: Left;   NASAL SINUS SURGERY Bilateral 1998   corrected nasal septum in [place]   UTERINE FIBROID SURGERY  2005   in [place],horrible blood  loss; not cancerous, "just a growth" some  being watched by OB/GYN.    Gynecologic History: No LMP recorded. (Menstrual status: Other).  Obstetric History: G0P0000  Family History:  Family History  Problem Relation Age of Onset   Endometriosis Mother    Hypertension Mother    Depression Mother    Migraines Mother    Cancer Mother        brain tumors   Skin cancer Father    Hypertension Father    Crohn's disease Father    Asthma Father    Breast cancer Paternal Grandmother    Endometriosis Sister    Depression Sister    Asthma Brother    Hip fracture Paternal Grandfather     Social History:  Social History   Socioeconomic History   Marital status: Single    Spouse name: Not on file   Number of children: Not on file   Years of education: 81   Highest education level: Not on file  Occupational History   Occupation: professor  Tobacco Use   Smoking status: Never   Smokeless tobacco: Never  Vaping Use   Vaping Use: Never used  Substance and Sexual Activity   Alcohol use: No    Comment: less than three mos   Drug use: No   Sexual activity: Not Currently    Birth control/protection: Inserts  Other Topics Concern   Not on file  Social History Narrative   Not on file   Social Determinants of Health   Financial Resource Strain: Not on file  Food  Insecurity: Not on file  Transportation Needs: Not on file  Physical Activity: Not on file  Stress: Not on file  Social Connections: Not on file  Intimate Partner Violence: Not on file    Allergies:  Allergies  Allergen Reactions   Aspirin Shortness Of Breath    Avoids it due to her asthma   Iodine Rash    Medications: Prior to Admission medications   Medication Sig Start Date End Date Taking? Authorizing Provider  loratadine (CLARITIN) 10 MG tablet Take 10 mg by mouth daily.   Yes [provider]  montelukast (SINGULAIR) 10 MG tablet TAKE 1 TABLET(10 MG) BY MOUTH AT BEDTIME 03/26/21  Yes Ratcliffe,  Heather R, PA-C  Multiple Vitamins-Minerals (MULTIVITAMIN ADULT) CHEW Chew 2 each by mouth daily.   Yes [provider]  Omega-3 Fatty Acids (FISH OIL) 1000 MG CAPS Take 3,000 mg by mouth daily.   Yes [provider]  acetaminophen (TYLENOL) 325 MG tablet Take 650 mg by mouth every 6 (six) hours as needed for moderate pain.    [provider]  Acetaminophen-Caffeine (TENSION HEADACHE) 500-65 MG TABS Take 2 tablets by mouth 2 (two) times daily as needed (HEADACHE).    [provider]  albuterol (PROVENTIL HFA;VENTOLIN HFA) 108 (90 Base) MCG/ACT inhaler Inhale 2 puffs into the lungs every 6 (six) hours as needed for wheezing or shortness of breath.    [provider]  baclofen (LIORESAL) 10 MG tablet Take by mouth.    [provider]  etonogestrel-ethinyl estradiol (NUVARING) 0.12-0.015 MG/24HR vaginal ring Insert vaginally and leave in place for 4 consecutive weeks, then replace with new ring. 03/02/21   Imagene Riches, CNM  fluticasone [facility]) 50 MCG/ACT nasal spray Place 2 sprays into both nostrils daily as needed for allergies.    [provider]  promethazine (PHENERGAN) 25 MG tablet Take 1 tablet (25 mg total) by mouth every 6 (six) hours as needed for nausea or vomiting. 04/16/21   Robert Bellow, MD  Pseudoephedrine HCl (SUDAFED PO) Take 10 mg by mouth.    [provider]  sodium chloride (OCEAN) 0.65 % SOLN nasal spray Place 1 spray into both nostrils as needed for congestion. 07/22/18   Ratcliffe, Heather R, PA-C  tamoxifen (NOLVADEX) 20 MG tablet Take 1 tablet (20 mg total) by mouth daily. 05/28/21   Sindy Guadeloupe, MD    Physical Exam Vitals:  Vitals:   05/28/21 1054  BP: 138/84   No LMP recorded. (Menstrual status: Other).  General: NAD HEENT: normocephalic, anicteric Pulmonary: No increased work of breathing Neurologic: Grossly intact Psychiatric: mood appropriate, affect full  Female chaperone  present for pelvic  portions of the physical exam  Assessment: 41 y.o. G0P0000 with ER/PR positive HER2-neu negative, BRCA 1/2 negative breast cancer diagnosed within the last month currently undergoing XRT with a history of AUB-L previously treated with Nuvaring  Plan: Problem List Items Addressed This Visit   None Visit Diagnoses     Hormone receptor positive malignant neoplasm of breast, unspecified laterality ([place])    -  Primary   Intramural leiomyoma of uterus       Relevant Orders   US PELVIC COMPLETE WITH TRANSVAGINAL   US PELVIC COMPLETE WITH TRANSVAGINAL   Abnormal uterine bleeding          1) ER/PR positive HER2-Neu negative breast cancer with history of AUB-L previously managed with Nuvaring.  The patient has been offered the option of SERM (Tamoxifen) vs ovarian  suppression and aromatase inhibitor following XRT for chemoprophylaxis. The patient expresses some concern about stimulation of growth in her uterine fibroids, she would like to avoid surgery as she underwent prior myomectomy in [place] in 2005 with entry of the uterine cavity as well as significant hemorrhage.  Mots recent pelvic imaging was obtained on 02/08/2020 showing 4 uterine fibroids: Fibroid 1: 33.9 x 32.8 x 31.1 mm subserosal left Fibroid 2:6.3 x 7.7 x 6.5 mm subserosal right Fibroid 3: 7.0 x 5.7 x 7.3 mm intramural right Fibroid 4: 11.1 x 10.3 x 11.0 mm subserosal right Fibroid 5: 10.4 x 7.7 x 9.4 mm subserosal right  We discussed that the incidence of fibroids is relatively high with 50% of women developing fibroids. Peak incidence is noted in the 3rd and 4th decade of life.  Growth is estrogen dependent, and generally slow. With the transition to menopause there is regression in the size of the fibroids secondary to the withdrawal of endogenous estrogen. The average age of Menopause is 107 although there is a fair bit of variation in this. Treatment option for uterine fibroids include hormonal treatment such as   OCP, Levonorgestrel IUD, or Myfembree and Oriahnn which are contraindicated given the patients ER/PR positive breast cancer.  The use the [facility] agonist Lupron is used in the management of fibroid but generally limited to 6 months of use and to achieve debulking of fibroid size often presurgically to hysterectomy.  More recently oral [facility] antagonists have come on the market (Myfembree and [place]) but these are combination products that do contain estrogen/progesterone.  Barbette Merino contains the [facility] agonist elagolix which is also available as Freida Busman without estrogen and progesterone component designed for the treatment of endometriosis.  The patient does not have a personal documented history of endometriosis but does relay a family history of endometriosis increasing her personal risk of developing endometriosis 2-3 fold (~30%).  Uterine artery embolization is a minimally invasive procedure that could be considered, given last imaging the smaller fibroids will be difficult to isolate but treatment of the 3cm fibroid is likely feasible.  Additional hysterectomy remains and option although the patient is likely to have some adhesive disease that could complicated the procedure given her reported history of prior myomectomy with significant intraoperative hemorrhage.    In women who are premenopausal at diagnosis, the NCCN Panel recommends tamoxifen treatment with or without ovarian suppression/ablation.  Ovarian ablation may be accomplished by surgical oophorectomy or by ovarian irradiation.  In two randomized trials (TEXT and SOFT), premenopausal women with hormone receptor-positive early-stage breast cancer showed statistical improvements in disease free survival.  For patients receiving the aromatase inhibitor exemestane plus ovarian suppression significantly reduces recurrences as compared with tamoxifen plus ovarian suppression.    NCCN Guidelines Breast Cancer Version 3.2019   Discussed that there is some  evidence supporting the use of ovarian suppression and that this may be accomplished via surgical oophorectomy, pelvic irradiation, or using [facility] agonist.  The modest risk reduction in recurrence has to be personally weighed with the side-effects of these interventions and it remains a personal choice.  The patient did undergo [employer] testing and was negative for BRCA1/2.  At the end of our visit the options laid out given her breast cancer and desire to avoid surgical intervention were:  - discussed tamoxifen alone - tamoxifen and [place] - Lupron and aromatase inhibitor - Surgical suppression and aromatase inhibitor - [place]  I reassured her that I do not expect significant fibroid growth on the Tamoxifen  despite having some estrogen agonist effects on the the uterus..  Discussed that the [place] as well as combination OCP which are used in the treatment of AUB-L also contain estrogen and are not associated with significant uterine fibroids. Addition of [place] to tamoxifen would decrease circulating levels of estrogen and likely result in some decrease in the size of her uterine fibroids.    We will obtain TVUS to establish current size of her uterine fibroids as well 6 month interval to document stability with whatever treatment option she chooses.   A total of 42 minutes were spent in face-to-face contact with the patient during this encounter with over half of that time devoted to counseling and coordination of care.  Prior imaging and pathology independently reviewed.    Return if symptoms worsen or fail to improve.    Malachy Mood, MD, [facility], [place]

## 2021-05-28 NOTE — Progress Notes (Deleted)
Obstetrics & Gynecology Office Visit   Chief Complaint:  Chief Complaint  Patient presents with   Contraception    Hx Breast cancer, discuss OPC options. RM 4    History of Present Illness: ***   Review of Systems: ***  Past Medical History:  Past Medical History:  Diagnosis Date   Asthma    Cancer ([place])    Family history of breast cancer    History of migraine headaches    Abstract from medicat   Uterine myoma     Past Surgical History:  Past Surgical History:  Procedure Laterality Date   ADJACENT TISSUE TRANSFER/TISSUE REARRANGEMENT Left 04/16/2021   Procedure: ADJACENT TISSUE TRANSFER/TISSUE REARRANGEMENT;  Surgeon: Robert Bellow, MD;  Location: [facility] ORS;  Service: General;  Laterality: Left;   BREAST LUMPECTOMY WITH SENTINEL LYMPH NODE BIOPSY Left 04/16/2021   Procedure: BREAST LUMPECTOMY WITH SENTINEL LYMPH NODE BX;  Surgeon: Robert Bellow, MD;  Location: [facility] ORS;  Service: General;  Laterality: Left;   NASAL SINUS SURGERY Bilateral 1998   corrected nasal septum in [place]   UTERINE FIBROID SURGERY  2005   in [place],horrible blood loss; not cancerous, "just a growth" some  being watched by OB/GYN.    Gynecologic History: No LMP recorded. (Menstrual status: Other).  Obstetric History: G0P0000  Family History:  Family History  Problem Relation Age of Onset   Endometriosis Mother    Hypertension Mother    Depression Mother    Migraines Mother    Cancer Mother        brain tumors   Skin cancer Father    Hypertension Father    Crohn's disease Father    Asthma Father    Breast cancer Paternal Grandmother    Endometriosis Sister    Depression Sister    Asthma Brother    Hip fracture Paternal Grandfather     Social History:  Social History   Socioeconomic History   Marital status: Single    Spouse name: Not on file   Number of children: Not on file   Years of education: 13   Highest education level: Not on file  Occupational  History   Occupation: professor  Tobacco Use   Smoking status: Never   Smokeless tobacco: Never  Vaping Use   Vaping Use: Never used  Substance and Sexual Activity   Alcohol use: No    Comment: less than three mos   Drug use: No   Sexual activity: Not Currently    Birth control/protection: Inserts  Other Topics Concern   Not on file  Social History Narrative   Not on file   Social Determinants of Health   Financial Resource Strain: Not on file  Food Insecurity: Not on file  Transportation Needs: Not on file  Physical Activity: Not on file  Stress: Not on file  Social Connections: Not on file  Intimate Partner Violence: Not on file    Allergies:  Allergies  Allergen Reactions   Aspirin Shortness Of Breath    Avoids it due to her asthma   Iodine Rash    Medications: Prior to Admission medications   Medication Sig Start Date End Date Taking? Authorizing Provider  loratadine (CLARITIN) 10 MG tablet Take 10 mg by mouth daily.   Yes [provider]  montelukast (SINGULAIR) 10 MG tablet TAKE 1 TABLET(10 MG) BY MOUTH AT BEDTIME 03/26/21  Yes Ratcliffe, Heather R, PA-C  Multiple Vitamins-Minerals (MULTIVITAMIN ADULT) CHEW Chew 2 each by mouth  daily.   Yes [provider]  Omega-3 Fatty Acids (FISH OIL) 1000 MG CAPS Take 3,000 mg by mouth daily.   Yes [provider]  acetaminophen (TYLENOL) 325 MG tablet Take 650 mg by mouth every 6 (six) hours as needed for moderate pain.    [provider]  Acetaminophen-Caffeine (TENSION HEADACHE) 500-65 MG TABS Take 2 tablets by mouth 2 (two) times daily as needed (HEADACHE).    [provider]  albuterol (PROVENTIL HFA;VENTOLIN HFA) 108 (90 Base) MCG/ACT inhaler Inhale 2 puffs into the lungs every 6 (six) hours as needed for wheezing or shortness of breath.    [provider]  baclofen (LIORESAL) 10 MG tablet Take by mouth.    [provider]  etonogestrel-ethinyl estradiol  (NUVARING) 0.12-0.015 MG/24HR vaginal ring Insert vaginally and leave in place for 4 consecutive weeks, then replace with new ring. 03/02/21   Imagene Riches, CNM  fluticasone [facility]) 50 MCG/ACT nasal spray Place 2 sprays into both nostrils daily as needed for allergies.    [provider]  promethazine (PHENERGAN) 25 MG tablet Take 1 tablet (25 mg total) by mouth every 6 (six) hours as needed for nausea or vomiting. 04/16/21   Robert Bellow, MD  Pseudoephedrine HCl (SUDAFED PO) Take 10 mg by mouth.    [provider]  sodium chloride (OCEAN) 0.65 % SOLN nasal spray Place 1 spray into both nostrils as needed for congestion. 07/22/18   Talmage Nap, PA-C    Physical Exam Vitals:  Vitals:   05/28/21 1054  BP: 138/84   No LMP recorded. (Menstrual status: Other).  General: NAD HEENT: normocephalic, anicteric Thyroid: no enlargement, no palpable nodules Pulmonary: No increased work of breathing Cardiovascular: RRR, distal pulses 2+ Abdomen: NABS, soft, non-tender, non-distended.  Umbilicus without lesions.  No hepatomegaly, splenomegaly or masses palpable. No evidence of hernia  Genitourinary:  External: Normal external female genitalia.  Normal urethral meatus, normal  Bartholin's and Skene's glands.    Vagina: Normal vaginal mucosa, no evidence of prolapse.    Cervix: Grossly normal in appearance, no bleeding  Uterus: Non-enlarged, mobile, normal contour.  No CMT  Adnexa: ovaries non-enlarged, no adnexal masses  Rectal: deferred  Lymphatic: no evidence of inguinal lymphadenopathy Extremities: no edema, erythema, or tenderness Neurologic: Grossly intact Psychiatric: Sullivan appropriate, affect full  Female chaperone present for pelvic  portions of the physical exam  Assessment: 41 y.o. G0P0000***  Plan: Problem List Items Addressed This Visit   None  1) Invitaea negative 03/27/2021  Madison Mood, MD, [place], [place]  [facility] 05/28/2021, 11:02 AM

## 2021-05-28 NOTE — Patient Instructions (Signed)
In women who are premenopausal at diagnosis, the NCCN Panel recommends tamoxifen treatment with or without ovarian suppression/ablation.  Ovarian ablation may be accomplished by surgical oophorectomy or by ovarian irradiation.  In two randomized trials (TEXT and SOFT), premenopausal women with hormone receptor-positive early-stage breast cancer showed statistical improvements in disease free survival.  For patients receiving the aromatase inhibitor exemestane plus ovarian suppression significantly reduces recurrences as compared with tamoxifen plus ovarian suppression.    NCCN Guidelines Breast Cancer Version 3.2019

## 2021-05-29 ENCOUNTER — Ambulatory Visit: Payer: BC Managed Care – PPO

## 2021-05-29 ENCOUNTER — Ambulatory Visit
Admission: RE | Admit: 2021-05-29 | Discharge: 2021-05-29 | Disposition: A | Discharge status: Home / Self Care | Payer: BC Managed Care – PPO | Source: Ambulatory Visit | Attending: Radiation Oncology | Admitting: Radiation Oncology

## 2021-05-29 DIAGNOSIS — C50212 Malignant neoplasm of upper-inner quadrant of left female breast: Secondary | ICD-10-CM | POA: Diagnosis not present

## 2021-05-30 ENCOUNTER — Ambulatory Visit: Payer: BC Managed Care – PPO

## 2021-05-30 ENCOUNTER — Ambulatory Visit
Admission: RE | Admit: 2021-05-30 | Discharge: 2021-05-30 | Disposition: A | Discharge status: Home / Self Care | Payer: BC Managed Care – PPO | Source: Ambulatory Visit | Attending: Radiation Oncology | Admitting: Radiation Oncology

## 2021-05-30 DIAGNOSIS — C50212 Malignant neoplasm of upper-inner quadrant of left female breast: Secondary | ICD-10-CM | POA: Diagnosis not present

## 2021-05-31 ENCOUNTER — Ambulatory Visit
Admission: RE | Admit: 2021-05-31 | Discharge: 2021-05-31 | Disposition: A | Discharge status: Home / Self Care | Payer: BC Managed Care – PPO | Source: Ambulatory Visit | Attending: Radiation Oncology | Admitting: Radiation Oncology

## 2021-05-31 ENCOUNTER — Ambulatory Visit: Payer: BC Managed Care – PPO

## 2021-05-31 DIAGNOSIS — C50212 Malignant neoplasm of upper-inner quadrant of left female breast: Secondary | ICD-10-CM | POA: Diagnosis not present

## 2021-06-01 ENCOUNTER — Ambulatory Visit
Admission: RE | Admit: 2021-06-01 | Discharge: 2021-06-01 | Disposition: A | Discharge status: Home / Self Care | Payer: BC Managed Care – PPO | Source: Ambulatory Visit | Attending: Radiation Oncology | Admitting: Radiation Oncology

## 2021-06-01 ENCOUNTER — Ambulatory Visit: Payer: BC Managed Care – PPO

## 2021-06-01 DIAGNOSIS — C50212 Malignant neoplasm of upper-inner quadrant of left female breast: Secondary | ICD-10-CM | POA: Diagnosis not present

## 2021-06-03 NOTE — Progress Notes (Signed)
Hematology/Oncology Consult note [facility]  Telephone:([phone] [phone]  Patient Care Team: Rolanda Lundborg as PCP - General (Physician Assistant)   Name of the patient: Madison Sullivan  [id number]  17-Oct-1980   Date of visit: 06/03/21  Diagnosis- Pathological prgnostic stage  Chief complaint/ Reason for visit- discuss oncotype results and furthe rmanagement  Heme/Onc history: Patient is a 41 year old female who noticed fullness in the upper aspect of the left breast which she brought to the attention of GYN.  Diagnostic bilateral mammogram in October 2022 showed hypoechoic mass in the 11 o'clock position in the left breast measuring 1.7 x 1.3 x 1.7 cm.  Several left axillary lymph nodes prominent to mildly thickened cortices.  These are likely reactive from recent COVID-vaccine..  Left breast biopsy of the mass showed invasive mammary carcinoma grade 3 ER greater than 90% positive PR greater than 90% positive and HER2 negative.  Patient met with Dr. Bary Castilla who also biopsied one of the concerning lymph nodes in the mammogram which was negative for malignancy.   Oncotype score came back at low risk of 14.  She therefore did not require adjuvant chemotherapy.  She is currently undergoing adjuvant radiation treatment.  Absolute chemotherapy benefit for all ages with chemotherapy less than 1% with distant recurrence risk at 9 years was 4% with tamoxifen or AI alone  Interval history-patient is currently tolerating radiation treatment well.  She denies any specific complaints at this time  ECOG PS- 0 Pain scale- 0   Review of systems- Review of Systems  Constitutional:  Negative for chills, fever, malaise/fatigue and weight loss.  HENT:  Negative for congestion, ear discharge and nosebleeds.   Eyes:  Negative for blurred vision.  Respiratory:  Negative for cough, hemoptysis, sputum production, shortness of breath and wheezing.    Cardiovascular:  Negative for chest pain, palpitations, orthopnea and claudication.  Gastrointestinal:  Negative for abdominal pain, blood in stool, constipation, diarrhea, heartburn, melena, nausea and vomiting.  Genitourinary:  Negative for dysuria, flank pain, frequency, hematuria and urgency.  Musculoskeletal:  Negative for back pain, joint pain and myalgias.  Skin:  Negative for rash.  Neurological:  Negative for dizziness, tingling, focal weakness, seizures, weakness and headaches.  Endo/Heme/Allergies:  Does not bruise/bleed easily.  Psychiatric/Behavioral:  Negative for depression and suicidal ideas. The patient does not have insomnia.      Allergies  Allergen Reactions   Aspirin Shortness Of Breath    Avoids it due to her asthma   Iodine Rash     Past Medical History:  Diagnosis Date   Asthma    Cancer ([place])    Family history of breast cancer    History of migraine headaches    Abstract from medicat   Uterine myoma      Past Surgical History:  Procedure Laterality Date   ADJACENT TISSUE TRANSFER/TISSUE REARRANGEMENT Left 04/16/2021   Procedure: ADJACENT TISSUE TRANSFER/TISSUE REARRANGEMENT;  Surgeon: Robert Bellow, MD;  Location: [facility] ORS;  Service: General;  Laterality: Left;   BREAST LUMPECTOMY WITH SENTINEL LYMPH NODE BIOPSY Left 04/16/2021   Procedure: BREAST LUMPECTOMY WITH SENTINEL LYMPH NODE BX;  Surgeon: Robert Bellow, MD;  Location: [facility] ORS;  Service: General;  Laterality: Left;   NASAL SINUS SURGERY Bilateral 1998   corrected nasal septum in [place]   UTERINE FIBROID SURGERY  2005   in [place],horrible blood loss; not cancerous, "just a growth" some  being watched by OB/GYN.  Social History   Socioeconomic History   Marital status: Single    Spouse name: Not on file   Number of children: Not on file   Years of education: 18   Highest education level: Not on file  Occupational History   Occupation: professor  Tobacco Use   Smoking  status: Never   Smokeless tobacco: Never  Vaping Use   Vaping Use: Never used  Substance and Sexual Activity   Alcohol use: No    Comment: less than three mos   Drug use: No   Sexual activity: Not Currently    Birth control/protection: Inserts  Other Topics Concern   Not on file  Social History Narrative   Not on file   Social Determinants of Health   Financial Resource Strain: Not on file  Food Insecurity: Not on file  Transportation Needs: Not on file  Physical Activity: Not on file  Stress: Not on file  Social Connections: Not on file  Intimate Partner Violence: Not on file    Family History  Problem Relation Age of Onset   Endometriosis Mother    Hypertension Mother    Depression Mother    Migraines Mother    Cancer Mother        brain tumors   Skin cancer Father    Hypertension Father    Crohn's disease Father    Asthma Father    Breast cancer Paternal Grandmother    Endometriosis Sister    Depression Sister    Asthma Brother    Hip fracture Paternal Grandfather      Current Outpatient Medications:    acetaminophen (TYLENOL) 325 MG tablet, Take 650 mg by mouth every 6 (six) hours as needed for moderate pain., Disp: , Rfl:    Acetaminophen-Caffeine (TENSION HEADACHE) 500-65 MG TABS, Take 2 tablets by mouth 2 (two) times daily as needed (HEADACHE)., Disp: , Rfl:    albuterol (PROVENTIL HFA;VENTOLIN HFA) 108 (90 Base) MCG/ACT inhaler, Inhale 2 puffs into the lungs every 6 (six) hours as needed for wheezing or shortness of breath., Disp: , Rfl:    baclofen (LIORESAL) 10 MG tablet, Take by mouth., Disp: , Rfl:    etonogestrel-ethinyl estradiol (NUVARING) 0.12-0.015 MG/24HR vaginal ring, Insert vaginally and leave in place for 4 consecutive weeks, then replace with new ring., Disp: 3 each, Rfl: 4   fluticasone (FLONASE) 50 MCG/ACT nasal spray, Place 2 sprays into both nostrils daily as needed for allergies., Disp: , Rfl:    loratadine (CLARITIN) 10 MG tablet, Take  10 mg by mouth daily., Disp: , Rfl:    montelukast (SINGULAIR) 10 MG tablet, TAKE 1 TABLET(10 MG) BY MOUTH AT BEDTIME, Disp: 90 tablet, Rfl: 1   Multiple Vitamins-Minerals (MULTIVITAMIN ADULT) CHEW, Chew 2 each by mouth daily., Disp: , Rfl:    Omega-3 Fatty Acids (FISH OIL) 1000 MG CAPS, Take 3,000 mg by mouth daily., Disp: , Rfl:    promethazine (PHENERGAN) 25 MG tablet, Take 1 tablet (25 mg total) by mouth every 6 (six) hours as needed for nausea or vomiting., Disp: 20 tablet, Rfl: 0   Pseudoephedrine HCl (SUDAFED PO), Take 10 mg by mouth., Disp: , Rfl:    sodium chloride (OCEAN) 0.65 % SOLN nasal spray, Place 1 spray into both nostrils as needed for congestion., Disp: 1 Bottle, Rfl: 6   tamoxifen (NOLVADEX) 20 MG tablet, Take 1 tablet (20 mg total) by mouth daily., Disp: 30 tablet, Rfl: 3  Physical exam:  Vitals:   05/28/21 1425  BP: 136/86  Pulse: 78  Resp: 16  Temp: (!) 97.2 F (36.2 C)  SpO2: 98%  Weight: 153 lb 12.8 oz (69.8 kg)  Height: '5\' 9"'  (1.753 m)   Physical Exam Constitutional:      General: She is not in acute distress. Cardiovascular:     Rate and Rhythm: Normal rate and regular rhythm.  Pulmonary:     Effort: Pulmonary effort is normal.     Breath sounds: Normal breath sounds.  Skin:    General: Skin is warm and dry.  Neurological:     Mental Status: She is alert and oriented to person, place, and time.     CMP Latest Ref Rng & Units 02/27/2021  Glucose 70 - 99 mg/dL 85  BUN 6 - 24 mg/dL 10  Creatinine 0.57 - 1.00 mg/dL 0.74  Sodium 134 - 144 mmol/L 140  Potassium 3.5 - 5.2 mmol/L 4.0  Chloride 96 - 106 mmol/L 106  CO2 20 - 29 mmol/L -  Calcium 8.7 - 10.2 mg/dL 8.6(L)  Total Protein 6.0 - 8.5 g/dL 6.5  Total Bilirubin 0.0 - 1.2 mg/dL 0.4  Alkaline Phos 44 - 121 IU/L 101  AST 0 - 40 IU/L 16  ALT 0 - 32 IU/L 16   CBC Latest Ref Rng & Units 02/27/2021  WBC 3.4 - 10.8 x10E3/uL 5.9  Hemoglobin 11.1 - 15.9 g/dL 14.1  Hematocrit 34.0 - 46.6 % 41.0   Platelets 150 - 450 x10E3/uL 257   Assessment and plan- Patient is a 41 y.o. 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  Patient has also had a virtual visit with [facility] for a second opinion who concurs with this plan.  She has therefore started adjuvant radiation treatment which she will be completing shortly.  With regards to adjuvant hormone therapy RS clin  shows 10-year risk of breast cancer recurrence difference between tamoxifen and AI was 2%.  12% with tamoxifen and 10% with AI plus OS.  There is therefore not a significant difference in overall survival despite ovarian suppression but still given the fact that patient is young this is something which we can consider.  However patient is currently undergoing work-up for her headaches and will be seeing neurology soon.  Sometimes headaches can be exacerbated with ovarian suppression.  Patient also has a history of fibroids and she is concerned that fibroids can potentially flareup with tamoxifen and she is seeing GYN for the same.  Further complicating this issue is the fact that patient will have a potential travel to [place] for 3 or more months sometime in May.  Ovarian suppression injections may not be readily available in [place] if it is started here.  Options at this time include: 1.  Starting ovarian suppression now and Arimidex after about 2 months after confirming that hormone levels are postmenopausal 2.  Starting with tamoxifen now which she will continue until she returns from [place].  We can transition her to ovarian suppression plus Arimidex at that time.  This will also allow [place] to see how she is tolerating tamoxifen and if there are any issues with her uterine fibroids with the start  of tamoxifen.  GYN is also met with the patient and has spoken about considering medications like Freida Busman which are  LH FSH and estradiol suppressing drugs which can potentially help both her breast cancer as well as endometriosis.  We have mutually agreed to proceed with tamoxifen at this time and I will reassess how she is tolerating it in March.  I will check a CMP at that time.  Written information about both tamoxifen, Lupron and Arimidex has been given to the patient.  Treatment will be given with a curative intent.  She can start with tamoxifen now or wait until radiation treatment is completed.   Visit Diagnosis 1. Malignant neoplasm of upper-inner quadrant of left breast in female, estrogen receptor positive ([place])      Dr. Randa Evens, MD, MPH [facility] [phone] 06/03/2021 5:24 PM

## 2021-06-04 ENCOUNTER — Ambulatory Visit: Payer: BC Managed Care – PPO

## 2021-06-04 ENCOUNTER — Ambulatory Visit
Admission: RE | Admit: 2021-06-04 | Discharge: 2021-06-04 | Disposition: A | Discharge status: Home / Self Care | Payer: BC Managed Care – PPO | Source: Ambulatory Visit | Attending: Radiation Oncology | Admitting: Radiation Oncology

## 2021-06-04 DIAGNOSIS — C50212 Malignant neoplasm of upper-inner quadrant of left female breast: Secondary | ICD-10-CM | POA: Diagnosis not present

## 2021-06-05 ENCOUNTER — Ambulatory Visit: Payer: BC Managed Care – PPO

## 2021-06-05 ENCOUNTER — Ambulatory Visit
Admission: RE | Admit: 2021-06-05 | Discharge: 2021-06-05 | Disposition: A | Discharge status: Home / Self Care | Payer: BC Managed Care – PPO | Source: Ambulatory Visit | Attending: Radiation Oncology | Admitting: Radiation Oncology

## 2021-06-05 DIAGNOSIS — C50212 Malignant neoplasm of upper-inner quadrant of left female breast: Secondary | ICD-10-CM | POA: Diagnosis not present

## 2021-06-06 ENCOUNTER — Ambulatory Visit
Admission: RE | Admit: 2021-06-06 | Discharge: 2021-06-06 | Disposition: A | Discharge status: Home / Self Care | Payer: BC Managed Care – PPO | Source: Ambulatory Visit | Attending: Radiation Oncology | Admitting: Radiation Oncology

## 2021-06-06 ENCOUNTER — Other Ambulatory Visit: Payer: Self-pay

## 2021-06-06 ENCOUNTER — Ambulatory Visit: Payer: BC Managed Care – PPO

## 2021-06-06 ENCOUNTER — Inpatient Hospital Stay: Payer: BC Managed Care – PPO

## 2021-06-06 DIAGNOSIS — C50212 Malignant neoplasm of upper-inner quadrant of left female breast: Secondary | ICD-10-CM | POA: Diagnosis not present

## 2021-06-06 DIAGNOSIS — Z170 Estrogen receptor positive status [ER+]: Secondary | ICD-10-CM

## 2021-06-06 LAB — CBC
HCT: 41.2 % (ref 36.0–46.0)
Hemoglobin: 14 g/dL (ref 12.0–15.0)
MCH: 30 pg (ref 26.0–34.0)
MCHC: 34 g/dL (ref 30.0–36.0)
MCV: 88.4 fL (ref 80.0–100.0)
Platelets: 270 10*3/uL (ref 150–400)
RBC: 4.66 MIL/uL (ref 3.87–5.11)
RDW: 12.5 % (ref 11.5–15.5)
WBC: 6 10*3/uL (ref 4.0–10.5)
nRBC: 0 % (ref 0.0–0.2)

## 2021-06-07 ENCOUNTER — Ambulatory Visit: Payer: BC Managed Care – PPO

## 2021-06-07 ENCOUNTER — Ambulatory Visit
Admission: RE | Admit: 2021-06-07 | Discharge: 2021-06-07 | Disposition: A | Discharge status: Home / Self Care | Payer: BC Managed Care – PPO | Source: Ambulatory Visit | Attending: Radiation Oncology | Admitting: Radiation Oncology

## 2021-06-07 DIAGNOSIS — C50212 Malignant neoplasm of upper-inner quadrant of left female breast: Secondary | ICD-10-CM | POA: Diagnosis not present

## 2021-06-08 ENCOUNTER — Ambulatory Visit: Payer: BC Managed Care – PPO

## 2021-06-08 ENCOUNTER — Ambulatory Visit
Admission: RE | Admit: 2021-06-08 | Discharge: 2021-06-08 | Disposition: A | Discharge status: Home / Self Care | Payer: BC Managed Care – PPO | Source: Ambulatory Visit | Attending: Radiation Oncology | Admitting: Radiation Oncology

## 2021-06-08 DIAGNOSIS — C50212 Malignant neoplasm of upper-inner quadrant of left female breast: Secondary | ICD-10-CM | POA: Diagnosis not present

## 2021-06-11 ENCOUNTER — Ambulatory Visit
Admission: RE | Admit: 2021-06-11 | Discharge: 2021-06-11 | Disposition: A | Discharge status: Home / Self Care | Payer: BC Managed Care – PPO | Source: Ambulatory Visit | Attending: Radiation Oncology | Admitting: Radiation Oncology

## 2021-06-11 ENCOUNTER — Ambulatory Visit: Payer: BC Managed Care – PPO

## 2021-06-11 DIAGNOSIS — C50212 Malignant neoplasm of upper-inner quadrant of left female breast: Secondary | ICD-10-CM | POA: Diagnosis not present

## 2021-06-12 ENCOUNTER — Ambulatory Visit
Admission: RE | Admit: 2021-06-12 | Discharge: 2021-06-12 | Disposition: A | Discharge status: Home / Self Care | Payer: BC Managed Care – PPO | Source: Ambulatory Visit | Attending: Radiation Oncology | Admitting: Radiation Oncology

## 2021-06-12 ENCOUNTER — Ambulatory Visit
Admission: RE | Admit: 2021-06-12 | Discharge: 2021-06-12 | Disposition: A | Discharge status: Home / Self Care | Payer: BC Managed Care – PPO | Source: Ambulatory Visit | Attending: Obstetrics and Gynecology | Admitting: Obstetrics and Gynecology

## 2021-06-12 ENCOUNTER — Ambulatory Visit: Payer: BC Managed Care – PPO

## 2021-06-12 DIAGNOSIS — D251 Intramural leiomyoma of uterus: Secondary | ICD-10-CM | POA: Diagnosis present

## 2021-06-12 DIAGNOSIS — C50212 Malignant neoplasm of upper-inner quadrant of left female breast: Secondary | ICD-10-CM | POA: Diagnosis not present

## 2021-06-13 ENCOUNTER — Ambulatory Visit
Admission: RE | Admit: 2021-06-13 | Discharge: 2021-06-13 | Disposition: A | Discharge status: Home / Self Care | Payer: BC Managed Care – PPO | Source: Ambulatory Visit | Attending: Radiation Oncology | Admitting: Radiation Oncology

## 2021-06-13 ENCOUNTER — Ambulatory Visit: Payer: BC Managed Care – PPO

## 2021-06-13 DIAGNOSIS — C50212 Malignant neoplasm of upper-inner quadrant of left female breast: Secondary | ICD-10-CM | POA: Diagnosis not present

## 2021-06-14 ENCOUNTER — Ambulatory Visit
Admission: RE | Admit: 2021-06-14 | Discharge: 2021-06-14 | Disposition: A | Discharge status: Home / Self Care | Payer: BC Managed Care – PPO | Source: Ambulatory Visit | Attending: Radiation Oncology | Admitting: Radiation Oncology

## 2021-06-14 ENCOUNTER — Ambulatory Visit: Payer: BC Managed Care – PPO

## 2021-06-14 DIAGNOSIS — C50212 Malignant neoplasm of upper-inner quadrant of left female breast: Secondary | ICD-10-CM | POA: Diagnosis not present

## 2021-06-15 ENCOUNTER — Ambulatory Visit: Payer: BC Managed Care – PPO

## 2021-06-15 ENCOUNTER — Ambulatory Visit
Admission: RE | Admit: 2021-06-15 | Discharge: 2021-06-15 | Disposition: A | Discharge status: Home / Self Care | Payer: BC Managed Care – PPO | Source: Ambulatory Visit | Attending: Radiation Oncology | Admitting: Radiation Oncology

## 2021-06-15 DIAGNOSIS — C50212 Malignant neoplasm of upper-inner quadrant of left female breast: Secondary | ICD-10-CM | POA: Diagnosis not present

## 2021-06-18 ENCOUNTER — Ambulatory Visit: Payer: BC Managed Care – PPO

## 2021-06-18 ENCOUNTER — Ambulatory Visit
Admission: RE | Admit: 2021-06-18 | Discharge: 2021-06-18 | Disposition: A | Discharge status: Home / Self Care | Payer: BC Managed Care – PPO | Source: Ambulatory Visit | Attending: Radiation Oncology | Admitting: Radiation Oncology

## 2021-06-18 DIAGNOSIS — C50212 Malignant neoplasm of upper-inner quadrant of left female breast: Secondary | ICD-10-CM | POA: Diagnosis not present

## 2021-06-19 ENCOUNTER — Ambulatory Visit: Payer: BC Managed Care – PPO

## 2021-06-19 ENCOUNTER — Ambulatory Visit
Admission: RE | Admit: 2021-06-19 | Discharge: 2021-06-19 | Disposition: A | Discharge status: Home / Self Care | Payer: BC Managed Care – PPO | Source: Ambulatory Visit | Attending: Radiation Oncology | Admitting: Radiation Oncology

## 2021-06-19 DIAGNOSIS — C50212 Malignant neoplasm of upper-inner quadrant of left female breast: Secondary | ICD-10-CM | POA: Diagnosis not present

## 2021-06-20 ENCOUNTER — Encounter: Payer: Self-pay | Admitting: Medical

## 2021-06-20 ENCOUNTER — Ambulatory Visit: Payer: BC Managed Care – PPO

## 2021-06-21 ENCOUNTER — Ambulatory Visit: Payer: BC Managed Care – PPO

## 2021-06-22 ENCOUNTER — Ambulatory Visit: Payer: BC Managed Care – PPO

## 2021-06-25 ENCOUNTER — Ambulatory Visit: Payer: BC Managed Care – PPO

## 2021-06-26 ENCOUNTER — Ambulatory Visit: Payer: BC Managed Care – PPO | Admitting: Medical

## 2021-06-26 ENCOUNTER — Other Ambulatory Visit: Payer: BC Managed Care – PPO

## 2021-06-26 ENCOUNTER — Other Ambulatory Visit: Payer: Self-pay

## 2021-06-26 ENCOUNTER — Ambulatory Visit: Payer: BC Managed Care – PPO

## 2021-06-26 ENCOUNTER — Encounter: Payer: Self-pay | Admitting: *Deleted

## 2021-06-26 ENCOUNTER — Other Ambulatory Visit: Payer: Self-pay | Admitting: Medical

## 2021-06-26 DIAGNOSIS — C50212 Malignant neoplasm of upper-inner quadrant of left female breast: Secondary | ICD-10-CM

## 2021-06-26 DIAGNOSIS — Z170 Estrogen receptor positive status [ER+]: Secondary | ICD-10-CM

## 2021-06-27 LAB — MICROSCOPIC EXAMINATION: RBC, Urine: NONE SEEN /hpf (ref 0–2)

## 2021-06-27 LAB — CMP12+LP+TP+TSH+6AC+CBC/D/PLT
ALT: 37 IU/L — ABNORMAL HIGH (ref 0–32)
AST: 22 IU/L (ref 0–40)
Albumin/Globulin Ratio: 2 (ref 1.2–2.2)
Albumin: 4.4 g/dL (ref 3.8–4.8)
Alkaline Phosphatase: 127 IU/L — ABNORMAL HIGH (ref 44–121)
BUN/Creatinine Ratio: 17 (ref 9–23)
BUN: 15 mg/dL (ref 6–24)
Basophils Absolute: 0 10*3/uL (ref 0.0–0.2)
Basos: 1 %
Bilirubin Total: 0.5 mg/dL (ref 0.0–1.2)
Calcium: 9 mg/dL (ref 8.7–10.2)
Chloride: 105 mmol/L (ref 96–106)
Chol/HDL Ratio: 3.6 ratio (ref 0.0–4.4)
Cholesterol, Total: 226 mg/dL — ABNORMAL HIGH (ref 100–199)
Creatinine, Ser: 0.89 mg/dL (ref 0.57–1.00)
EOS (ABSOLUTE): 0.1 10*3/uL (ref 0.0–0.4)
Eos: 3 %
Estimated CHD Risk: 0.6 times avg. (ref 0.0–1.0)
Free Thyroxine Index: 2 (ref 1.2–4.9)
GGT: 24 IU/L (ref 0–60)
Globulin, Total: 2.2 g/dL (ref 1.5–4.5)
Glucose: 93 mg/dL (ref 70–99)
HDL: 63 mg/dL (ref >39)
Hematocrit: 42.7 % (ref 34.0–46.6)
Hemoglobin: 14.4 g/dL (ref 11.1–15.9)
Immature Grans (Abs): 0 10*3/uL (ref 0.0–0.1)
Immature Granulocytes: 0 %
Iron: 102 ug/dL (ref 27–159)
LDH: 161 IU/L (ref 119–226)
LDL Chol Calc (NIH): 142 mg/dL — ABNORMAL HIGH (ref 0–99)
Lymphocytes Absolute: 1.1 10*3/uL (ref 0.7–3.1)
Lymphs: 25 %
MCH: 29.9 pg (ref 26.6–33.0)
MCHC: 33.7 g/dL (ref 31.5–35.7)
MCV: 89 fL (ref 79–97)
Monocytes Absolute: 0.4 10*3/uL (ref 0.1–0.9)
Monocytes: 10 %
Neutrophils Absolute: 2.8 10*3/uL (ref 1.4–7.0)
Neutrophils: 61 %
Phosphorus: 3.3 mg/dL (ref 3.0–4.3)
Platelets: 235 10*3/uL (ref 150–450)
Potassium: 4.1 mmol/L (ref 3.5–5.2)
RBC: 4.82 x10E6/uL (ref 3.77–5.28)
RDW: 12.3 % (ref 11.7–15.4)
Sodium: 141 mmol/L (ref 134–144)
T3 Uptake Ratio: 24 % (ref 24–39)
T4, Total: 8.3 ug/dL (ref 4.5–12.0)
TSH: 0.546 u[IU]/mL (ref 0.450–4.500)
Total Protein: 6.6 g/dL (ref 6.0–8.5)
Triglycerides: 119 mg/dL (ref 0–149)
Uric Acid: 4.8 mg/dL (ref 2.6–6.2)
VLDL Cholesterol Cal: 21 mg/dL (ref 5–40)
WBC: 4.5 10*3/uL (ref 3.4–10.8)
eGFR: 83 mL/min/{1.73_m2} (ref >59)

## 2021-06-27 LAB — VITAMIN D 25 HYDROXY (VIT D DEFICIENCY, FRACTURES): Vit D, 25-Hydroxy: 35.3 ng/mL (ref 30.0–100.0)

## 2021-06-27 LAB — URINALYSIS, ROUTINE W REFLEX MICROSCOPIC
Bilirubin, UA: NEGATIVE
Glucose, UA: NEGATIVE
Nitrite, UA: NEGATIVE
RBC, UA: NEGATIVE
Specific Gravity, UA: 1.021 (ref 1.005–1.030)
Urobilinogen, Ur: 0.2 mg/dL (ref 0.2–1.0)
pH, UA: 6 (ref 5.0–7.5)

## 2021-06-27 LAB — HGB A1C W/O EAG: Hgb A1c MFr Bld: 5 % (ref 4.8–5.6)

## 2021-07-02 ENCOUNTER — Ambulatory Visit: Payer: BC Managed Care – PPO | Admitting: Medical

## 2021-07-02 ENCOUNTER — Other Ambulatory Visit: Payer: Self-pay

## 2021-07-02 DIAGNOSIS — Z0189 Encounter for other specified special examinations: Secondary | ICD-10-CM

## 2021-07-02 NOTE — Progress Notes (Signed)
Telemedicine appointment with patient per her consent.  Reviewed labs with patient.  Denies any urinary symptoms at this time. She has restarted her Vit D3. Recheck in  3 months, scheduled.   On Tamoxifen, labs per her Oncology doctors. Patient can get free labs at [facility] if Oncology doctors will write a prescription with diagnosis.   She is hoping to travel over the summer to [place].  She will contact us if she needs any thing.  Noted increases in  ALT 37   Cholesterol 226        LDL  142 will monitior. Patient in agreement of plan.

## 2021-07-03 ENCOUNTER — Encounter: Payer: Self-pay | Admitting: Medical

## 2021-07-05 NOTE — Progress Notes (Signed)
Slight increase in uterine size from prior scan 2021.  Stable fibroids, minimal growth.  Can refer to [facility]) if desires surgery

## 2021-07-06 ENCOUNTER — Telehealth: Payer: Self-pay

## 2021-07-06 NOTE — Telephone Encounter (Signed)
Patient aware. She does not desire surgery at this time. She was diagnosed with breast cancer in the fall and has undergone surgery for that. She also has had to stop estrogen (Nuvaring) d/t this diagnosis. In the past when she was off estrogen her fibroids grew very quickly. She is also on Tomoxifen now. She is able to see the u/s report with the two measurements of fibroids. She would like to know if it is possible for Dr. Kenton Kingfisher to look at the images and send her a report in my chart of the size of all 4 fibroids as Dr. Georgianne Fick had in the past. She is aware she will need to establish care with another GYN for future follow up on this.

## 2021-07-06 NOTE — Telephone Encounter (Signed)
-----   Message from Gae Dry, MD sent at 07/05/2021  7:46 AM EST ----- Slight increase in uterine size from prior scan 2021.  Stable fibroids, minimal growth.  Can refer to [facility]) if desires surgery

## 2021-07-07 ENCOUNTER — Encounter: Payer: Self-pay | Admitting: Obstetrics & Gynecology

## 2021-07-11 ENCOUNTER — Ambulatory Visit: Payer: BC Managed Care – PPO | Admitting: Medical

## 2021-07-11 ENCOUNTER — Encounter: Payer: Self-pay | Admitting: Medical

## 2021-07-11 ENCOUNTER — Other Ambulatory Visit: Payer: Self-pay

## 2021-07-11 VITALS — BP 118/74 | HR 99 | Temp 99.1°F | Resp 16

## 2021-07-11 DIAGNOSIS — Z889 Allergy status to unspecified drugs, medicaments and biological substances status: Secondary | ICD-10-CM

## 2021-07-11 NOTE — Progress Notes (Signed)
° °  Subjective:    Patient ID: Madison Sullivan, female    DOB: 1981/01/16, 41 y.o.   MRN: [id number]  HPI 41 yo female in non acute distress, here for sinus congestion and allergies. On Tamoxifen and wondering what medications she can take.   Review of Systems  HENT:  Positive for congestion, ear pain (ear pressure when laying on right side) and sneezing.   Allergic/Immunologic: Positive for environmental allergies.      Objective:   Physical Exam Constitutional:      Appearance: Normal appearance.  HENT:     Head: Normocephalic and atraumatic.     Right Ear: Ear canal and external ear normal.     Left Ear: Tympanic membrane, ear canal and external ear normal.     Nose: Congestion (mild) present.  Eyes:     Extraocular Movements: Extraocular movements intact.     Conjunctiva/sclera: Conjunctivae normal.     Pupils: Pupils are equal, round, and reactive to light.  Cardiovascular:     Rate and Rhythm: Normal rate and regular rhythm.  Pulmonary:     Effort: Pulmonary effort is normal.     Breath sounds: Normal breath sounds.  Musculoskeletal:     Cervical back: Normal range of motion and neck supple.  Skin:    General: Skin is warm and dry.  Neurological:     General: No focal deficit present.     Mental Status: She is alert and oriented to person, place, and time.  Psychiatric:        Mood and Affect: Mood normal.        Behavior: Behavior normal.        Thought Content: Thought content normal.        Judgment: Judgment normal.          Assessment & Plan:  Seasonal allergies Can take Zyrtec or Claritin , and mucinex and flonase if needed.  Avoid Benadryl.  Patient and I discussed why certain medications are not to be taken with Tamoxifen. She verbalizes understanding and has no  questions at discharge.

## 2021-07-11 NOTE — Patient Instructions (Signed)
Allergies, Adult An allergy means that your body reacts to something that bothers it (allergen). This can happen from something that you eat, breathe in, or touch. Allergies often affect the nose, eyes, skin, and stomach. They can be mild, moderate, or very bad (severe). An allergy cannot spread from person to person. They can happen at any age. Sometimes, people outgrow them. What are the causes? Outdoor things, such as pollen, car fumes, and mold. Indoor things, such as dust, smoke, mold, and pets. Foods. Medicines. Things that bother your skin, such as perfume and bug bites. What increases the risk? Having family members with allergies or asthma. What are the signs or symptoms? Symptoms depend on how bad your allergy is. Mild to moderate symptoms Runny nose, stuffy nose, or sneezing. Itchy mouth, ears, or throat. A feeling of mucus dripping down the back of your throat. Sore throat. Eyes that are itchy, red, watery, or puffy. A skin rash, or red, swollen areas of skin (hives). Stomach cramps or bloating. Severe symptoms Very bad allergies to food, medicine, or bug bites may cause a very bad allergy reaction (anaphylaxis). This can be life-threatening. Symptoms include: A red face. Wheezing or coughing. Swollen lips, tongue, or mouth. Tight or swollen throat. Chest pain or tightness, or a fast heartbeat. Trouble breathing or shortness of breath. Pain in your belly (abdomen), vomiting, or watery poop (diarrhea). Feeling dizzy or fainting. How is this treated?   Treatment for this condition depends on your symptoms. Treatment may include: Cold, wet cloths for itching and swelling. Eye drops, nose sprays, or skin creams. Washing out your nose each day. A humidifier. Medicines. A change to the foods you eat. Being exposed again and again to tiny amounts of allergens. This helps your body get used to them. You might have: Allergy shots. Very small amounts of allergen put under  your tongue. An emergency shot (auto-injector pen) if you have a very bad allergy reaction. This is a medicine with a needle. You can put it into your skin by yourself. Your doctor will teach you how to use it. Follow these instructions at home: Medicines  Take or apply over-the-counter and prescription medicines only as told by your doctor. If you are at risk for a very bad allergy reaction, keep an auto-injector pen with you all the time. Eating and drinking Follow instructions from your doctor about what to eat and drink. Drink enough fluid to keep your pee (urine) pale yellow. General instructions If you have ever had a very bad allergy reaction, wear a medical alert bracelet or necklace. Stay away from things that you are allergic to. Keep all follow-up visits as told by your doctor. This is important. Contact a doctor if: Your symptoms do not get better with treatment. Get help right away if: You have symptoms of a very bad allergy reaction. These include: A swollen mouth, tongue, or throat. Pain or tightness in your chest. Trouble breathing. Being short of breath. Dizziness. Fainting. Very bad pain in your belly. Vomiting. Watery poop. These symptoms may be an emergency. Do not wait to see if the symptoms will go away. Get medical help right away. Call your local emergency services (911 in the [place] Do not drive yourself to the hospital. Summary Take or apply over-the-counter and prescription medicines only as told by your doctor. Stay away from things you are allergic to. If you are at risk for a very bad allergy reaction, carry an auto-injector pen all the time. Wear a  medical alert bracelet or necklace. Very bad allergy reactions can be life-threatening. Get help right away. This information is not intended to replace advice given to you by your health care provider. Make sure you discuss any questions you have with your health care provider. Document Revised:  03/17/2019 Document Reviewed: 03/17/2019 Elsevier Patient Education  2022 [employer].

## 2021-07-23 ENCOUNTER — Ambulatory Visit
Admission: RE | Admit: 2021-07-23 | Discharge: 2021-07-23 | Disposition: A | Discharge status: Home / Self Care | Payer: BC Managed Care – PPO | Source: Ambulatory Visit | Attending: Radiation Oncology | Admitting: Radiation Oncology

## 2021-07-23 ENCOUNTER — Inpatient Hospital Stay: Payer: BC Managed Care – PPO | Admitting: Oncology

## 2021-07-23 ENCOUNTER — Other Ambulatory Visit: Payer: Self-pay

## 2021-07-23 ENCOUNTER — Inpatient Hospital Stay: Payer: BC Managed Care – PPO | Attending: Oncology

## 2021-07-23 ENCOUNTER — Encounter: Payer: Self-pay | Admitting: Oncology

## 2021-07-23 VITALS — BP 119/91 | HR 83 | Temp 99.4°F | Resp 18 | Wt 154.0 lb

## 2021-07-23 DIAGNOSIS — Z79810 Long term (current) use of selective estrogen receptor modulators (SERMs): Secondary | ICD-10-CM | POA: Insufficient documentation

## 2021-07-23 DIAGNOSIS — Z79899 Other long term (current) drug therapy: Secondary | ICD-10-CM | POA: Diagnosis not present

## 2021-07-23 DIAGNOSIS — Z08 Encounter for follow-up examination after completed treatment for malignant neoplasm: Secondary | ICD-10-CM | POA: Diagnosis not present

## 2021-07-23 DIAGNOSIS — Z5181 Encounter for therapeutic drug level monitoring: Secondary | ICD-10-CM

## 2021-07-23 DIAGNOSIS — C50212 Malignant neoplasm of upper-inner quadrant of left female breast: Secondary | ICD-10-CM

## 2021-07-23 DIAGNOSIS — Z170 Estrogen receptor positive status [ER+]: Secondary | ICD-10-CM | POA: Insufficient documentation

## 2021-07-23 DIAGNOSIS — Z853 Personal history of malignant neoplasm of breast: Secondary | ICD-10-CM | POA: Diagnosis not present

## 2021-07-23 DIAGNOSIS — Z923 Personal history of irradiation: Secondary | ICD-10-CM | POA: Insufficient documentation

## 2021-07-23 DIAGNOSIS — N959 Unspecified menopausal and perimenopausal disorder: Secondary | ICD-10-CM | POA: Insufficient documentation

## 2021-07-23 DIAGNOSIS — Z8669 Personal history of other diseases of the nervous system and sense organs: Secondary | ICD-10-CM | POA: Insufficient documentation

## 2021-07-23 LAB — COMPREHENSIVE METABOLIC PANEL WITH GFR
ALT: 18 U/L (ref 0–44)
AST: 18 U/L (ref 15–41)
Albumin: 3.9 g/dL (ref 3.5–5.0)
Alkaline Phosphatase: 84 U/L (ref 38–126)
Anion gap: 5 (ref 5–15)
BUN: 12 mg/dL (ref 6–20)
CO2: 26 mmol/L (ref 22–32)
Calcium: 9 mg/dL (ref 8.9–10.3)
Chloride: 108 mmol/L (ref 98–111)
Creatinine, Ser: 0.76 mg/dL (ref 0.44–1.00)
GFR, Estimated: >60 mL/min (ref >60)
Glucose, Bld: 96 mg/dL (ref 70–99)
Potassium: 3.9 mmol/L (ref 3.5–5.1)
Sodium: 139 mmol/L (ref 135–145)
Total Bilirubin: 0.2 mg/dL — ABNORMAL LOW (ref 0.3–1.2)
Total Protein: 6.5 g/dL (ref 6.5–8.1)

## 2021-07-23 MED ORDER — TAMOXIFEN CITRATE 20 MG PO TABS: 20.0000 mg | ORAL_TABLET | Freq: Every day | ORAL | 3 refills | Status: DC

## 2021-07-23 NOTE — Progress Notes (Signed)
left breast noticeably uncomfortable; thursday a week ago (07/12/21) identified a seroma, was drained, feels like its filling up again due to discomfort. Applies heat in the morning and night. not enough pain to wake her up at night but enough to notice being uncomfortable. Pain will radiate down her arm at times.  ?

## 2021-07-23 NOTE — Progress Notes (Signed)
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  Diagnostic bilateral mammogram in October 2022 showed hypoechoic mass in the 11 o'clock position in the left breast measuring 1.7 x 1.3 x 1.7 cm.  Several left axillary lymph nodes prominent to mildly thickened cortices.  These are likely reactive from recent COVID-vaccine..  Left breast biopsy of the mass showed invasive mammary carcinoma grade 3 ER greater than 90% positive PR greater than 90% positive and HER2 negative.  Patient met with Dr. Bary Castilla who also biopsied one of the concerning lymph nodes in the mammogram which was negative for malignancy.  Final pathology showed a 3.5 cm grade 3 invasive mammary carcinoma with negative margins.12 lymph nodes were negative for malignancy.  Oncotype score came back at 14 and therefore patient did not require any adjuvant chemotherapy.  Patient completed adjuvant radiation therapy.  Both ovarian suppression plus AI versus tamoxifen was discussed with the patient.  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  Family history of breast cancer in paternal grandmother, skin cancers in both her parents but she does not know if that was a melanoma.  She is G0, P0 and has decided against childbearing.  Menarche at the age of 83.  She is currently  premenopausal.  She does not desire any children in the future    Interval history-patient had her birth control stopped and started taking tamoxifen.  She is currently getting her menstrual cycles regularly and they are somewhat heavier than usual.  She also gets headaches during the start of her menstrual cycle which tends to get better.  She has not required any backup headache medications for which she has seen neurology in the past.  She had a left breast seroma which was recently drained by Dr. Bary Castilla.  She is concerned that her seroma has reaccumulated and she will be seeing him again next week.  ECOG PS- 0 Pain scale- 0   Review of systems- Review of Systems  Constitutional:  Negative for chills, fever, malaise/fatigue and weight loss.  HENT:  Negative for congestion, ear discharge and nosebleeds.   Eyes:  Negative for blurred vision.  Respiratory:  Negative for cough, hemoptysis, sputum production, shortness of breath and wheezing.   Cardiovascular:  Negative for chest pain, palpitations, orthopnea and claudication.  Gastrointestinal:  Negative for abdominal pain, blood in stool, constipation, diarrhea, heartburn, melena, nausea and vomiting.  Genitourinary:  Negative for dysuria, flank pain, frequency, hematuria and urgency.  Musculoskeletal:  Negative for back pain, joint pain and myalgias.  Skin:  Negative for rash.  Neurological:  Positive for headaches. Negative for dizziness, tingling, focal weakness, seizures and weakness.  Endo/Heme/Allergies:  Does not bruise/bleed easily.  Psychiatric/Behavioral:  Negative for depression and suicidal ideas. The patient does not have insomnia.      Allergies  Allergen Reactions   Aspirin Shortness Of Breath  Avoids it due to her asthma   Iodine Rash     Past Medical History:  Diagnosis Date   Asthma    Cancer ([place])    Family history of breast cancer    History of migraine headaches    Abstract from medicat   Uterine myoma       Past Surgical History:  Procedure Laterality Date   ADJACENT TISSUE TRANSFER/TISSUE REARRANGEMENT Left 04/16/2021   Procedure: ADJACENT TISSUE TRANSFER/TISSUE REARRANGEMENT;  Surgeon: Robert Bellow, MD;  Location: [facility] ORS;  Service: General;  Laterality: Left;   BREAST LUMPECTOMY WITH SENTINEL LYMPH NODE BIOPSY Left 04/16/2021   Procedure: BREAST LUMPECTOMY WITH SENTINEL LYMPH NODE BX;  Surgeon: Robert Bellow, MD;  Location: [facility] ORS;  Service: General;  Laterality: Left;   NASAL SINUS SURGERY Bilateral 1998   corrected nasal septum in [place]   UTERINE FIBROID SURGERY  2005   in [place],horrible blood loss; not cancerous, "just a growth" some  being watched by OB/GYN.    Social History   Socioeconomic History   Marital status: Single    Spouse name: Not on file   Number of children: Not on file   Years of education: 18   Highest education level: Not on file  Occupational History   Occupation: professor  Tobacco Use   Smoking status: Never   Smokeless tobacco: Never  Vaping Use   Vaping Use: Never used  Substance and Sexual Activity   Alcohol use: No    Comment: less than three mos   Drug use: No   Sexual activity: Not Currently    Birth control/protection: Inserts  Other Topics Concern   Not on file  Social History Narrative   Not on file   Social Determinants of Health   Financial Resource Strain: Not on file  Food Insecurity: Not on file  Transportation Needs: Not on file  Physical Activity: Not on file  Stress: Not on file  Social Connections: Not on file  Intimate Partner Violence: Not on file    Family History  Problem Relation Age of Onset   Endometriosis Mother    Hypertension Mother    Depression Mother    Migraines Mother    Cancer Mother        brain tumors   Skin cancer Father    Hypertension Father    Crohn's disease Father    Asthma Father    Breast cancer Paternal Grandmother    Endometriosis Sister    Depression Sister     Asthma Brother    Hip fracture Paternal Grandfather      Current Outpatient Medications:    acetaminophen (TYLENOL) 325 MG tablet, Take 650 mg by mouth every 6 (six) hours as needed for moderate pain., Disp: , Rfl:    Acetaminophen-Caffeine (TENSION HEADACHE) 500-65 MG TABS, Take 2 tablets by mouth 2 (two) times daily as needed (HEADACHE)., Disp: , Rfl:    albuterol (PROVENTIL HFA;VENTOLIN HFA) 108 (90 Base) MCG/ACT inhaler, Inhale 2 puffs into the lungs every 6 (six) hours as needed for wheezing or shortness of breath., Disp: , Rfl:    baclofen (LIORESAL) 10 MG tablet, Take by mouth., Disp: , Rfl:    cholecalciferol (VITAMIN D3) 25 MCG (1000 UNIT) tablet, Take 1,000 Units by mouth daily., Disp: , Rfl:    fluticasone (FLONASE) 50 MCG/ACT nasal spray, Place into the nose., Disp: , Rfl:    lidocaine-prilocaine (EMLA) cream, Apply topically., Disp: , Rfl:    loratadine (CLARITIN) 10  MG tablet, Take 10 mg by mouth daily., Disp: , Rfl:    magnesium citrate SOLN, Take 1 Bottle by mouth once., Disp: , Rfl:    montelukast (SINGULAIR) 10 MG tablet, TAKE 1 TABLET(10 MG) BY MOUTH AT BEDTIME, Disp: 90 tablet, Rfl: 1   Multiple Vitamins-Minerals (MULTIVITAMIN ADULT) CHEW, Chew 2 each by mouth daily., Disp: , Rfl:    Omega-3 Fatty Acids (FISH OIL) 1000 MG CAPS, Take 3,000 mg by mouth daily., Disp: , Rfl:    Pseudoephedrine HCl (SUDAFED PO), Take 10 mg by mouth., Disp: , Rfl:    rizatriptan (MAXALT-MLT) 10 MG disintegrating tablet, Take by mouth., Disp: , Rfl:    sodium chloride (OCEAN) 0.65 % SOLN nasal spray, Place 1 spray into both nostrils as needed for congestion., Disp: 1 Bottle, Rfl: 6   tamoxifen (NOLVADEX) 20 MG tablet, Take 1 tablet (20 mg total) by mouth daily., Disp: 30 tablet, Rfl: 3   HYDROcodone-acetaminophen (NORCO/VICODIN) 5-325 MG tablet, Take 1 tablet by mouth every 4 (four) hours as needed. (Patient not taking: Reported on 07/23/2021), Disp: , Rfl:    ketoconazole (NIZORAL) 2 % shampoo,  Use 3 times a week to affected area when active, reduce to 1x weekly once controlled for maintenance.  Leave on for 5-10 minutes then rinse. (Patient not taking: Reported on 07/23/2021), Disp: , Rfl:    promethazine (PHENERGAN) 25 MG tablet, Take 1 tablet (25 mg total) by mouth every 6 (six) hours as needed for nausea or vomiting. (Patient not taking: Reported on 07/23/2021), Disp: 20 tablet, Rfl: 0  Physical exam:  Vitals:   07/23/21 1352  BP: (!) 119/91  Pulse: 83  Resp: 18  Temp: 99.4 F (37.4 C)  SpO2: 99%  Weight: 154 lb (69.9 kg)   Physical Exam Cardiovascular:     Rate and Rhythm: Normal rate and regular rhythm.     Heart sounds: Normal heart sounds.  Pulmonary:     Effort: Pulmonary effort is normal.     Breath sounds: Normal breath sounds.  Abdominal:     General: Bowel sounds are normal.     Palpations: Abdomen is soft.  Skin:    General: Skin is warm and dry.  Neurological:     Mental Status: She is alert and oriented to person, place, and time.   Breast exam was performed in seated and lying down position. Patient is status post left lumpectomy with a well-healed surgical scar.  There is a palpable swelling in the left breast close to the axilla consistent with a seroma.  No evidence of axillary adenopathy. No evidence of any palpable masses or lumps in the right breast. No evidence of right axillary adenopathy   CMP Latest Ref Rng & Units 07/23/2021  Glucose 70 - 99 mg/dL 96  BUN 6 - 20 mg/dL 12  Creatinine 0.44 - 1.00 mg/dL 0.76  Sodium 135 - 145 mmol/L 139  Potassium 3.5 - 5.1 mmol/L 3.9  Chloride 98 - 111 mmol/L 108  CO2 22 - 32 mmol/L 26  Calcium 8.9 - 10.3 mg/dL 9.0  Total Protein 6.5 - 8.1 g/dL 6.5  Total Bilirubin 0.3 - 1.2 mg/dL 0.2(L)  Alkaline Phos 38 - 126 U/L 84  AST 15 - 41 U/L 18  ALT 0 - 44 U/L 18   CBC Latest Ref Rng & Units 06/26/2021  WBC 3.4 - 10.8 x10E3/uL 4.5  Hemoglobin 11.1 - 15.9 g/dL 14.4  Hematocrit 34.0 - 46.6 % 42.7  Platelets 150 -  450  x10E3/uL 235    Assessment and plan- Patient is a 41 y.o. female with pathological prognostic stage Ib invasive mammary carcinoma of the left breast ER/PR positive HER2 negative s/p lumpectomy and adjuvant radiation treatment currently on tamoxifen and this is a routine follow-up visit  Explained to the patient that tamoxifen can have a variable effect on menstrual cycles.  Women can sometimes continue to get normal menstrual cycles whereas others can have occasional spotting to amenorrhea.  I would recommend continuing tamoxifen at this time and not switch to ovarian suppression and Arimidex given [place]  She has occasional headaches at the start of her menstrual cycles and it is unclear if they are truly related to tamoxifen.  I [place]  She does have a palpable left breast seroma again for which she will be seeing Dr. BBary Castillanext week   Visit Diagnosis 1. Encounter for monitoring tamoxifen therapy   2. Encounter for follow-up surveillance of breast cancer      Dr. ARanda Evens MD, MPH [id number]/10/2021 3:34 PM

## 2021-07-23 NOTE — Progress Notes (Signed)
Radiation Oncology ?Follow up Note ? ?Name: Madison Sullivan   ?Date:   07/23/2021 ?MRN:  [id number] ?DOB: 12/11/1980  ? ? ?This 41 y.o. female presents to the clinic today for 1 month follow-up status post whole breast radiation to her left breast for stage IIa (T2 N0 M0) ER/PR positive invasive mammary carcinoma. ? ?REFERRING PROVIDER: Talmage Nap, P* ? ?HPI: Patient is a 41 year old female now at 1 month having completed whole breast radiation to her left breast for stage IIa ER/PR positive invasive mammary carcinoma.  Seen today in routine follow-up she is doing well she still is having some pain and discomfort around her nipple and into her left axilla she has had a seroma drained once by Dr. Tollie Pizza.  Still causing a little pressure in that region..  She has been started on tamoxifen tolerating well without side effect. ? ?COMPLICATIONS OF TREATMENT: none ? ?FOLLOW UP COMPLIANCE: keeps appointments  ? ?PHYSICAL EXAM:  ?There were no vitals taken for this visit. ?Lungs are clear to A&P cardiac examination essentially unremarkable with regular rate and rhythm. No dominant mass or nodularity is noted in either breast in 2 positions examined. Incision is well-healed. No axillary or supraclavicular adenopathy is appreciated. Cosmetic result is excellent.  There is a small seroma in her left axilla.  Well-developed well-nourished patient in NAD. HEENT reveals PERLA, EOMI, discs not visualized.  Oral cavity is clear. No oral mucosal lesions are identified. Neck is clear without evidence of cervical or supraclavicular adenopathy. Lungs are clear to A&P. Cardiac examination is essentially unremarkable with regular rate and rhythm without murmur rub or thrill. Abdomen is benign with no organomegaly or masses noted. Motor sensory and DTR levels are equal and symmetric in the upper and lower extremities. Cranial nerves II through XII are grossly intact. Proprioception is intact. No peripheral adenopathy or edema is  identified. No motor or sensory levels are noted. Crude visual fields are within normal range. ? ?RADIOLOGY RESULTS: No current films for review ? ?PLAN: Present time patient is doing well.  Dr. Tollie Pizza will continue to monitor her seroma.  I have assured her all of this will clear in the next several months.  She continues on tamoxifen without side effect I have asked to see her back in 4 to 5 months for follow-up.  Patient knows to call with any concerns. ? ?I would like to take this opportunity to thank you for allowing me to participate in the care of your patient.. ?  ? Noreene Filbert, MD ? ?

## 2021-09-20 ENCOUNTER — Other Ambulatory Visit: Payer: Self-pay | Admitting: Medical

## 2021-09-20 DIAGNOSIS — Z760 Encounter for issue of repeat prescription: Secondary | ICD-10-CM

## 2021-10-03 ENCOUNTER — Other Ambulatory Visit: Payer: BC Managed Care – PPO

## 2021-10-03 DIAGNOSIS — E782 Mixed hyperlipidemia: Secondary | ICD-10-CM

## 2021-10-03 DIAGNOSIS — D229 Melanocytic nevi, unspecified: Secondary | ICD-10-CM

## 2021-10-03 DIAGNOSIS — Z0000 Encounter for general adult medical examination without abnormal findings: Secondary | ICD-10-CM

## 2021-10-03 DIAGNOSIS — Z0189 Encounter for other specified special examinations: Secondary | ICD-10-CM

## 2021-10-03 LAB — POCT URINALYSIS DIPSTICK
Bilirubin, UA: NEGATIVE
Blood, UA: NEGATIVE
Glucose, UA: NEGATIVE
Ketones, UA: NEGATIVE
Leukocytes, UA: NEGATIVE
Nitrite, UA: NEGATIVE
Protein, UA: NEGATIVE
Spec Grav, UA: <=1.005 — AB (ref 1.010–1.025)
Urobilinogen, UA: 0.2 E.U./dL
pH, UA: 7 (ref 5.0–8.0)

## 2021-10-03 NOTE — Progress Notes (Signed)
Discussed with patient Cancer treatment. ?She states she gets fluid build up of the left axillary , is taking  ?Celebrex which is helping a lot. ?She mentions her eye have been itching, on exam conjunctiva and sclarea are all clear. ?Recommended she use OTC Patanol eye drops to help with itching. ? ?Informed my leaving with patient.  ?She would like Judson Roch if possible to be her PCP. ? ?

## 2021-10-04 LAB — COMPREHENSIVE METABOLIC PANEL
ALT: 11 IU/L (ref 0–32)
AST: 12 IU/L (ref 0–40)
Albumin/Globulin Ratio: 1.9 (ref 1.2–2.2)
Albumin: 4.4 g/dL (ref 3.8–4.8)
Alkaline Phosphatase: 130 IU/L — ABNORMAL HIGH (ref 44–121)
BUN/Creatinine Ratio: 19 (ref 9–23)
BUN: 15 mg/dL (ref 6–24)
Bilirubin Total: <0.2 mg/dL (ref 0.0–1.2)
CO2: 23 mmol/L (ref 20–29)
Calcium: 8.5 mg/dL — ABNORMAL LOW (ref 8.7–10.2)
Chloride: 108 mmol/L — ABNORMAL HIGH (ref 96–106)
Creatinine, Ser: 0.81 mg/dL (ref 0.57–1.00)
Globulin, Total: 2.3 g/dL (ref 1.5–4.5)
Glucose: 89 mg/dL (ref 70–99)
Potassium: 4.4 mmol/L (ref 3.5–5.2)
Sodium: 142 mmol/L (ref 134–144)
Total Protein: 6.7 g/dL (ref 6.0–8.5)
eGFR: 93 mL/min/{1.73_m2} (ref >59)

## 2021-10-04 LAB — LIPID PANEL
Chol/HDL Ratio: 2.6 ratio (ref 0.0–4.4)
Cholesterol, Total: 151 mg/dL (ref 100–199)
HDL: 57 mg/dL (ref >39)
LDL Chol Calc (NIH): 77 mg/dL (ref 0–99)
Triglycerides: 92 mg/dL (ref 0–149)
VLDL Cholesterol Cal: 17 mg/dL (ref 5–40)

## 2021-11-28 ENCOUNTER — Encounter: Payer: Self-pay | Admitting: Medical

## 2021-12-26 ENCOUNTER — Encounter: Payer: Self-pay | Admitting: Nurse Practitioner

## 2022-01-08 ENCOUNTER — Ambulatory Visit
Admission: RE | Admit: 2022-01-08 | Discharge: 2022-01-08 | Disposition: A | Discharge status: Home / Self Care | Payer: BC Managed Care – PPO | Source: Ambulatory Visit | Attending: Oncology | Admitting: Oncology

## 2022-01-08 ENCOUNTER — Encounter: Payer: Self-pay | Admitting: Oncology

## 2022-01-08 ENCOUNTER — Inpatient Hospital Stay: Payer: BC Managed Care – PPO | Attending: Oncology | Admitting: Oncology

## 2022-01-08 ENCOUNTER — Encounter: Payer: Self-pay | Admitting: Radiation Oncology

## 2022-01-08 ENCOUNTER — Ambulatory Visit
Admission: RE | Admit: 2022-01-08 | Discharge: 2022-01-08 | Disposition: A | Discharge status: Home / Self Care | Payer: BC Managed Care – PPO | Source: Ambulatory Visit | Attending: Radiation Oncology | Admitting: Radiation Oncology

## 2022-01-08 ENCOUNTER — Other Ambulatory Visit: Payer: Self-pay | Admitting: Oncology

## 2022-01-08 VITALS — BP 114/81 | HR 101 | Temp 97.3°F | Resp 18 | Ht 69.0 in | Wt 154.7 lb

## 2022-01-08 VITALS — BP 119/82 | HR 90 | Temp 98.8°F | Resp 16 | Wt 154.7 lb

## 2022-01-08 DIAGNOSIS — Z79810 Long term (current) use of selective estrogen receptor modulators (SERMs): Secondary | ICD-10-CM | POA: Insufficient documentation

## 2022-01-08 DIAGNOSIS — Z08 Encounter for follow-up examination after completed treatment for malignant neoplasm: Secondary | ICD-10-CM

## 2022-01-08 DIAGNOSIS — Z923 Personal history of irradiation: Secondary | ICD-10-CM | POA: Insufficient documentation

## 2022-01-08 DIAGNOSIS — Z853 Personal history of malignant neoplasm of breast: Secondary | ICD-10-CM | POA: Diagnosis not present

## 2022-01-08 DIAGNOSIS — Z5181 Encounter for therapeutic drug level monitoring: Secondary | ICD-10-CM

## 2022-01-08 DIAGNOSIS — C50212 Malignant neoplasm of upper-inner quadrant of left female breast: Secondary | ICD-10-CM | POA: Insufficient documentation

## 2022-01-08 DIAGNOSIS — Z170 Estrogen receptor positive status [ER+]: Secondary | ICD-10-CM | POA: Insufficient documentation

## 2022-01-08 DIAGNOSIS — Z79811 Long term (current) use of aromatase inhibitors: Secondary | ICD-10-CM | POA: Insufficient documentation

## 2022-01-08 HISTORY — DX: Personal history of irradiation: Z92.3

## 2022-01-08 NOTE — Progress Notes (Signed)
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  Diagnostic bilateral mammogram in October 2022 showed hypoechoic mass in the 11 o'clock position in the left breast measuring 1.7 x 1.3 x 1.7 cm.  Several left axillary lymph nodes prominent to mildly thickened cortices.  These are likely reactive from recent COVID-vaccine..  Left breast biopsy of the mass showed invasive mammary carcinoma grade 3 ER greater than 90% positive PR greater than 90% positive and HER2 negative.  Patient met with Dr. Bary Castilla who also biopsied one of the concerning lymph nodes in the mammogram which was negative for malignancy.  Final pathology showed a 3.5 cm grade 3 invasive mammary carcinoma with negative margins.12 lymph nodes were negative for malignancy.  Oncotype score came back at 14 and therefore patient did not require any adjuvant chemotherapy.  Patient completed adjuvant radiation therapy.  Both ovarian suppression plus AI versus tamoxifen was discussed with the patient.  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  Family history of breast cancer in paternal grandmother, skin cancers in both her parents but she does not know if that was a melanoma.  She is G0, P0 and has decided against childbearing.  Menarche at the age of 27.  She  is currently premenopausal.  She does not desire any children in the future    Interval history-patient was taking celecoxib for her symptoms of seroma.  That has improved and she is reduce the dose of celecoxib.  She has noticed more achiness in her joints and numbness in her bilateral hands at the base of her thumb right more than left after reducing the dose of celecoxib.  Menstrual cycles are still regular.  ECOG PS- 0 Pain scale- 0   Review of systems- Review of Systems  Constitutional:  Negative for chills, fever, malaise/fatigue and weight loss.  HENT:  Negative for congestion, ear discharge and nosebleeds.   Eyes:  Negative for blurred vision.  Respiratory:  Negative for cough, hemoptysis, sputum production, shortness of breath and wheezing.   Cardiovascular:  Negative for chest pain, palpitations, orthopnea and claudication.  Gastrointestinal:  Negative for abdominal pain, blood in stool, constipation, diarrhea, heartburn, melena, nausea and vomiting.  Genitourinary:  Negative for dysuria, flank pain, frequency, hematuria and urgency.  Musculoskeletal:  Positive for joint pain. Negative for back pain and myalgias.  Skin:  Negative for rash.  Neurological:  Negative for dizziness, tingling, focal weakness, seizures, weakness and headaches.  Endo/Heme/Allergies:  Does not bruise/bleed easily.  Psychiatric/Behavioral:  Negative for depression and suicidal ideas. The patient does not have insomnia.       Allergies  Allergen Reactions   Aspirin Shortness Of Breath    Avoids it due to her asthma   Iodine Rash     Past Medical History:  Diagnosis Date   Asthma    Cancer ([place])  Family history of breast cancer    History of migraine headaches    Abstract from medicat   Uterine myoma      Past Surgical History:  Procedure Laterality Date   ADJACENT TISSUE TRANSFER/TISSUE REARRANGEMENT Left 04/16/2021   Procedure: ADJACENT TISSUE TRANSFER/TISSUE REARRANGEMENT;  Surgeon:  Robert Bellow, MD;  Location: [facility] ORS;  Service: General;  Laterality: Left;   BREAST LUMPECTOMY WITH SENTINEL LYMPH NODE BIOPSY Left 04/16/2021   Procedure: BREAST LUMPECTOMY WITH SENTINEL LYMPH NODE BX;  Surgeon: Robert Bellow, MD;  Location: [facility] ORS;  Service: General;  Laterality: Left;   NASAL SINUS SURGERY Bilateral 1998   corrected nasal septum in [place]   UTERINE FIBROID SURGERY  2005   in [place],horrible blood loss; not cancerous, "just a growth" some  being watched by OB/GYN.    Social History   Socioeconomic History   Marital status: Single    Spouse name: Not on file   Number of children: Not on file   Years of education: 18   Highest education level: Not on file  Occupational History   Occupation: professor  Tobacco Use   Smoking status: Never   Smokeless tobacco: Never  Vaping Use   Vaping Use: Never used  Substance and Sexual Activity   Alcohol use: No    Comment: less than three mos   Drug use: No   Sexual activity: Not Currently    Birth control/protection: Inserts  Other Topics Concern   Not on file  Social History Narrative   Not on file   Social Determinants of Health   Financial Resource Strain: Not on file  Food Insecurity: Not on file  Transportation Needs: Not on file  Physical Activity: Not on file  Stress: Not on file  Social Connections: Not on file  Intimate Partner Violence: Not on file    Family History  Problem Relation Age of Onset   Endometriosis Mother    Hypertension Mother    Depression Mother    Migraines Mother    Cancer Mother        brain tumors   Skin cancer Father    Hypertension Father    Crohn's disease Father    Asthma Father    Breast cancer Paternal Grandmother    Endometriosis Sister    Depression Sister    Asthma Brother    Hip fracture Paternal Grandfather      Current Outpatient Medications:    acetaminophen (TYLENOL) 325 MG tablet, Take 650 mg by mouth every 6 (six) hours as needed for  moderate pain., Disp: , Rfl:    Acetaminophen-Caffeine (TENSION HEADACHE) 500-65 MG TABS, Take 2 tablets by mouth 2 (two) times daily as needed (HEADACHE)., Disp: , Rfl:    albuterol (PROVENTIL HFA;VENTOLIN HFA) 108 (90 Base) MCG/ACT inhaler, Inhale 2 puffs into the lungs every 6 (six) hours as needed for wheezing or shortness of breath., Disp: , Rfl:    cholecalciferol (VITAMIN D3) 25 MCG (1000 UNIT) tablet, Take 1,000 Units by mouth daily., Disp: , Rfl:    fluticasone (FLONASE) 50 MCG/ACT nasal spray, Place into the nose., Disp: , Rfl:    loratadine (CLARITIN) 10 MG tablet, Take 10 mg by mouth daily., Disp: , Rfl:    magnesium citrate SOLN, Take 1 Bottle by mouth once., Disp: , Rfl:    montelukast (SINGULAIR) 10 MG tablet, TAKE 1 TABLET(10 MG) BY MOUTH AT BEDTIME, Disp: 90 tablet, Rfl: 1   Multiple Vitamins-Minerals (MULTIVITAMIN ADULT) CHEW, Chew 2  each by mouth daily., Disp: , Rfl:    Omega-3 Fatty Acids (FISH OIL) 1000 MG CAPS, Take 3,000 mg by mouth daily., Disp: , Rfl:    Pseudoephedrine HCl (SUDAFED PO), Take 10 mg by mouth., Disp: , Rfl:    rizatriptan (MAXALT-MLT) 10 MG disintegrating tablet, Take by mouth., Disp: , Rfl:    sodium chloride (OCEAN) 0.65 % SOLN nasal spray, Place 1 spray into both nostrils as needed for congestion., Disp: 1 Bottle, Rfl: 6   tamoxifen (NOLVADEX) 20 MG tablet, Take 1 tablet (20 mg total) by mouth daily., Disp: 90 tablet, Rfl: 3   ketoconazole (NIZORAL) 2 % shampoo, Use 3 times a week to affected area when active, reduce to 1x weekly once controlled for maintenance.  Leave on for 5-10 minutes then rinse. (Patient not taking: Reported on 07/23/2021), Disp: , Rfl:    ondansetron (ZOFRAN) 8 MG tablet, Take by mouth. (Patient not taking: Reported on 01/08/2022), Disp: , Rfl:    promethazine (PHENERGAN) 25 MG tablet, Take 1 tablet (25 mg total) by mouth every 6 (six) hours as needed for nausea or vomiting. (Patient not taking: Reported on 01/08/2022), Disp: 20 tablet,  Rfl: 0  Physical exam:  Vitals:   01/08/22 1038  BP: 119/82  Pulse: 90  Resp: 16  Temp: 98.8 F (37.1 C)  SpO2: 98%  Weight: 154 lb 11.2 oz (70.2 kg)   Physical Exam Constitutional:      General: She is not in acute distress. Cardiovascular:     Rate and Rhythm: Normal rate and regular rhythm.     Heart sounds: Normal heart sounds.  Pulmonary:     Effort: Pulmonary effort is normal.     Breath sounds: Normal breath sounds.  Abdominal:     General: Bowel sounds are normal.     Palpations: Abdomen is soft.  Skin:    General: Skin is warm and dry.  Neurological:     Mental Status: She is alert and oriented to person, place, and time.   Breast exam: Patient is s/p left lumpectomy with well-healed surgical scar.  No palpable masses in the left breast.  No palpable bilateral axillary adenopathy.  There is a palpable 1 cm mass in the right breast just lateral to the areola at the 9 o'clock position.     Latest Ref Rng & Units 10/03/2021    8:04 AM  CMP  Glucose 70 - 99 mg/dL 89   BUN 6 - 24 mg/dL 15   Creatinine 0.57 - 1.00 mg/dL 0.81   Sodium 134 - 144 mmol/L 142   Potassium 3.5 - 5.2 mmol/L 4.4   Chloride 96 - 106 mmol/L 108   CO2 20 - 29 mmol/L 23   Calcium 8.7 - 10.2 mg/dL 8.5   Total Protein 6.0 - 8.5 g/dL 6.7   Total Bilirubin 0.0 - 1.2 mg/dL <0.2   Alkaline Phos 44 - 121 IU/L 130   AST 0 - 40 IU/L 12   ALT 0 - 32 IU/L 11       Latest Ref Rng & Units 06/26/2021    7:54 AM  CBC  WBC 3.4 - 10.8 x10E3/uL 4.5   Hemoglobin 11.1 - 15.9 g/dL 14.4   Hematocrit 34.0 - 46.6 % 42.7   Platelets 150 - 450 x10E3/uL 235      Assessment and plan- Patient is a 41 y.o. female with pathological prognostic stage Ib invasive mammary carcinoma of the left breast ER/PR positive HER2 negative s/p lumpectomy  and adjuvant radiation treatment currently on tamoxifen.  This is a routine follow-up visit  Patient is tolerating tamoxifen well for the most part other than occasional hot  flashes.  She has also noticed some increased numbness especially in her bilateral hands.  We discussed continuing tamoxifen versus attempting ovarian suppression plus aromatase inhibitors but they can have their own set of side effects as well.  I would not recommend lowering the dose of tamoxifen given that lower doses have not been studied in invasive breast cancer.  I do not palpate a right breast mass on today's exam.  She has known left breast cancer.  Patient states that when she was in [place] a couple of months ago she had an ultrasound then which had detected a cyst in the right breast.  I will proceed with a diagnostic ultrasound and mammogram at this time to better characterize this right breast mass.  She also has an upcoming appointment with Dr. Bary Castilla later this week.  I will see her back in 4 months no labs   Visit Diagnosis 1. Encounter for monitoring tamoxifen therapy   2. Encounter for follow-up surveillance of breast cancer      Dr. Randa Evens, MD, MPH [facility] [phone] 01/08/2022 1:58 PM

## 2022-01-08 NOTE — Progress Notes (Signed)
Pt states that for the past month she has notied lots of numbness and tingling in her fingers. Will like to know if it is a side effect being on tamoxifen. Also, Lots of swelling on pt feet as well. As well as, two weeks after she has her paeriod she develops severe pain in her abdomen area. More fluid builds up in her rt breast during her menstrual. Believes the tamoxifen is intruding with her lymphatic system.

## 2022-01-08 NOTE — Progress Notes (Signed)
Radiation Oncology Follow up Note  Name: Madison Sullivan   Date:   01/08/2022 MRN:  [id number] DOB: 1980/12/12    This 41 y.o. female presents to the clinic today for 32-monthfollow-up status post whole breast radiation to her left breast for stage IIa (T2 N0 M0) ER/PR positive invasive mammary carcinoma.  REFERRING PROVIDER: RMarcy Salvo  HPI: Patient is a 41year old female now out 6 months having completed whole breast radiation to her left breast for stage IIa ER/PR positive invasive mammary carcinoma seen today in routine follow-up she is doing well still has some tightness in her axilla as well as feeling her breast is somewhat more dense.  I have explained knows her changes secondary to scar tissue and the limitation of her ductal tissue in her left breast.  She otherwise specifically denies cough or bone pain.  She is currently on.  Tamoxifen.  She does complain of some slight tingling in her fingers mostly and she will address that with Dr. RJanese Banks  Dr. RJanese Bankswill also be ordering follow-up mammograms as well as ultrasound.  COMPLICATIONS OF TREATMENT: none  FOLLOW UP COMPLIANCE: keeps appointments   PHYSICAL EXAM:  BP 114/81 (BP Location: Right Arm, Patient Position: Sitting, Cuff Size: Normal)   Pulse (!) 101   Temp (!) 97.3 F (36.3 C) (Tympanic)   Resp 18   Ht '5\' 9"'$  (1.753 m) Comment: stated HT  Wt 154 lb 11.2 oz (70.2 kg)   BMI 22.85 kg/m  Lungs are clear to A&P cardiac examination essentially unremarkable with regular rate and rhythm. No dominant mass or nodularity is noted in either breast in 2 positions examined. Incision is well-healed. No axillary or supraclavicular adenopathy is appreciated. Cosmetic result is excellent.  Well-developed well-nourished patient in NAD. HEENT reveals PERLA, EOMI, discs not visualized.  Oral cavity is clear. No oral mucosal lesions are identified. Neck is clear without evidence of cervical or supraclavicular adenopathy. Lungs are clear to  A&P. Cardiac examination is essentially unremarkable with regular rate and rhythm without murmur rub or thrill. Abdomen is benign with no organomegaly or masses noted. Motor sensory and DTR levels are equal and symmetric in the upper and lower extremities. Cranial nerves II through XII are grossly intact. Proprioception is intact. No peripheral adenopathy or edema is identified. No motor or sensory levels are noted. Crude visual fields are within normal range.  RADIOLOGY RESULTS: No current films for review  PLAN: Present time patient is doing well very low side effect profile from her prior surgery and radiation.  She will discuss her sensation in her fingers with medical oncology as well as have her follow-up mammograms ordered.  I have asked to see her back in 6 months for follow-up.  Patient knows to call with any concerns.  I would like to take this opportunity to thank you for allowing me to participate in the care of your patient..Noreene Filbert MD

## 2022-01-09 ENCOUNTER — Other Ambulatory Visit: Payer: Self-pay | Admitting: Oncology

## 2022-01-09 DIAGNOSIS — R928 Other abnormal and inconclusive findings on diagnostic imaging of breast: Secondary | ICD-10-CM

## 2022-01-09 DIAGNOSIS — N63 Unspecified lump in unspecified breast: Secondary | ICD-10-CM

## 2022-01-29 ENCOUNTER — Encounter: Payer: Self-pay | Admitting: Licensed Clinical Social Worker

## 2022-01-29 DIAGNOSIS — Z1379 Encounter for other screening for genetic and chromosomal anomalies: Secondary | ICD-10-CM | POA: Insufficient documentation

## 2022-02-15 ENCOUNTER — Ambulatory Visit (INDEPENDENT_AMBULATORY_CARE_PROVIDER_SITE_OTHER): Payer: Self-pay | Admitting: Nurse Practitioner

## 2022-02-15 DIAGNOSIS — Z131 Encounter for screening for diabetes mellitus: Secondary | ICD-10-CM

## 2022-02-15 DIAGNOSIS — Z1321 Encounter for screening for nutritional disorder: Secondary | ICD-10-CM

## 2022-02-15 DIAGNOSIS — Z13220 Encounter for screening for lipoid disorders: Secondary | ICD-10-CM

## 2022-02-15 DIAGNOSIS — Z760 Encounter for issue of repeat prescription: Secondary | ICD-10-CM

## 2022-02-15 MED ORDER — MONTELUKAST SODIUM 10 MG PO TABS: 10.0000 mg | ORAL_TABLET | Freq: Every day | ORAL | 1 refills | Status: DC

## 2022-02-15 NOTE — Progress Notes (Signed)
Virtual Visit Consent   Madison Sullivan, you are scheduled for a virtual visit with a [facility] provider today. Just as with appointments in the office, your consent must be obtained to participate.   I need to obtain your verbal consent now. Are you willing to proceed with your visit today? Madison Sullivan has provided verbal consent on 02/15/2022 for a virtual visit (video or telephone). Apolonio Schneiders, FNP  Date: 02/15/2022 10:00 AM  Virtual Visit via Video Note   I, Apolonio Schneiders, connected with  Madison Sullivan  ([id number], 1980-12-05) on 02/15/22 at 10:00 AM EDT by telephone and verified that I am speaking with the correct person using two identifiers.  Location: Patient: Virtual Visit Location Patient: Home Provider: Virtual Visit Location Provider: Office/Clinic   I discussed the limitations of evaluation and management by telemedicine and the availability of in person appointments. The patient expressed understanding and agreed to proceed.    History of Present Illness: Madison Sullivan is a 41 y.o. and is being seen today for help with establishing new PCP  Medication refills .  1/24 apt [facility]  Problems:  Patient Active Problem List   Diagnosis Date Noted   Genetic testing 01/29/2022   Premenopausal patient 07/23/2021   History of migraine headaches 07/23/2021   Family history of breast cancer 04/03/2021   Malignant neoplasm of upper-inner quadrant of left breast in female, estrogen receptor positive ([place]) 04/02/2021   Breast cancer, left breast ([place]) 03/20/2021   Breast mass in female 03/12/2021   Fibroids 02/08/2020   Eustachian tube dysfunction 06/24/2016   Sinusitis 06/24/2016   Bronchitis 08/29/2015   Asthma 07/19/2015   Vitamin D deficiency 03/24/2015    Allergies:  Allergies  Allergen Reactions   Aspirin Shortness Of Breath    Avoids it due to her asthma   Iodine Rash   Medications:  Current Outpatient Medications:    acetaminophen (TYLENOL) 325 MG  tablet, Take 650 mg by mouth every 6 (six) hours as needed for moderate pain., Disp: , Rfl:    Acetaminophen-Caffeine (TENSION HEADACHE) 500-65 MG TABS, Take 2 tablets by mouth 2 (two) times daily as needed (HEADACHE)., Disp: , Rfl:    albuterol (PROVENTIL HFA;VENTOLIN HFA) 108 (90 Base) MCG/ACT inhaler, Inhale 2 puffs into the lungs every 6 (six) hours as needed for wheezing or shortness of breath., Disp: , Rfl:    cholecalciferol (VITAMIN D3) 25 MCG (1000 UNIT) tablet, Take 1,000 Units by mouth daily., Disp: , Rfl:    fluticasone (FLONASE) 50 MCG/ACT nasal spray, Place into the nose., Disp: , Rfl:    ketoconazole (NIZORAL) 2 % shampoo, Use 3 times a week to affected area when active, reduce to 1x weekly once controlled for maintenance.  Leave on for 5-10 minutes then rinse. (Patient not taking: Reported on 07/23/2021), Disp: , Rfl:    loratadine (CLARITIN) 10 MG tablet, Take 10 mg by mouth daily., Disp: , Rfl:    magnesium citrate SOLN, Take 1 Bottle by mouth once., Disp: , Rfl:    montelukast (SINGULAIR) 10 MG tablet, TAKE 1 TABLET(10 MG) BY MOUTH AT BEDTIME, Disp: 90 tablet, Rfl: 1   Multiple Vitamins-Minerals (MULTIVITAMIN ADULT) CHEW, Chew 2 each by mouth daily., Disp: , Rfl:    Omega-3 Fatty Acids (FISH OIL) 1000 MG CAPS, Take 3,000 mg by mouth daily., Disp: , Rfl:    ondansetron (ZOFRAN) 8 MG tablet, Take by mouth. (Patient not taking: Reported on 01/08/2022), Disp: , Rfl:    promethazine (PHENERGAN)  25 MG tablet, Take 1 tablet (25 mg total) by mouth every 6 (six) hours as needed for nausea or vomiting. (Patient not taking: Reported on 01/08/2022), Disp: 20 tablet, Rfl: 0   Pseudoephedrine HCl (SUDAFED PO), Take 10 mg by mouth., Disp: , Rfl:    rizatriptan (MAXALT-MLT) 10 MG disintegrating tablet, Take by mouth., Disp: , Rfl:    sodium chloride (OCEAN) 0.65 % SOLN nasal spray, Place 1 spray into both nostrils as needed for congestion., Disp: 1 Bottle, Rfl: 6   tamoxifen (NOLVADEX) 20 MG tablet,  Take 1 tablet (20 mg total) by mouth daily., Disp: 90 tablet, Rfl: 3  Observations/Objective: No labored breathing.  Speech is clear and coherent with logical content.  Patient is alert and oriented at baseline.    Assessment and Plan: 1. Medication refill  - montelukast (SINGULAIR) 10 MG tablet; Take 1 tablet (10 mg total) by mouth at bedtime.  Dispense: 90 tablet; Refill: 1  2. Encounter for vitamin deficiency screening  - VITAMIN D 25 Hydroxy (Vit-D Deficiency, Fractures); Future  3. Screening for diabetes mellitus  - Hgb A1c w/o eAG; Future  4. Screening cholesterol level  - CMP12+LP+TP+TSH+6AC+CBC/D/Plt; Future    Follow Up Instructions: I discussed the assessment and treatment plan with the patient. The patient was provided an opportunity to ask questions and all were answered. The patient agreed with the plan and demonstrated an understanding of the instructions.    The patient was advised to call back or seek an in-person evaluation if the symptoms worsen or if the condition fails to improve as anticipated.  Time:  I spent 10 minutes with the patient via telehealth technology discussing the above problems/concerns.    Apolonio Schneiders, FNP

## 2022-02-20 ENCOUNTER — Ambulatory Visit (INDEPENDENT_AMBULATORY_CARE_PROVIDER_SITE_OTHER): Payer: Self-pay | Admitting: Nurse Practitioner

## 2022-02-20 DIAGNOSIS — Z13220 Encounter for screening for lipoid disorders: Secondary | ICD-10-CM

## 2022-02-20 DIAGNOSIS — Z1321 Encounter for screening for nutritional disorder: Secondary | ICD-10-CM

## 2022-02-20 DIAGNOSIS — R21 Rash and other nonspecific skin eruption: Secondary | ICD-10-CM

## 2022-02-20 DIAGNOSIS — Z131 Encounter for screening for diabetes mellitus: Secondary | ICD-10-CM

## 2022-02-20 NOTE — Progress Notes (Signed)
[facility] [address], [place] [address] [phone]  Office Visit Note  Patient Name: Madison Sullivan Date of Birth [id number]  Medical Record number [id number]  Date of Service: 02/20/2022  Chief Complaint  Patient presents with   Follow-up    Follow up on rash. "Its there" its not bothering her a lot.      HPI 41 year old female presenting to [facility] for annual labs and assessment of rash on left thigh.   She spent the summer in [place], noted rash on left thigh when returning.  Denies itching to area Denies other systemic symptoms   She has intermittently been using hydrocortisone to the rash with improvement, rash has not spread.    Current Medication:  Outpatient Encounter Medications as of 02/20/2022  Medication Sig   acetaminophen (TYLENOL) 325 MG tablet Take 650 mg by mouth every 6 (six) hours as needed for moderate pain.   Acetaminophen-Caffeine (TENSION HEADACHE) 500-65 MG TABS Take 2 tablets by mouth 2 (two) times daily as needed (HEADACHE).   albuterol (PROVENTIL HFA;VENTOLIN HFA) 108 (90 Base) MCG/ACT inhaler Inhale 2 puffs into the lungs every 6 (six) hours as needed for wheezing or shortness of breath.   cholecalciferol (VITAMIN D3) 25 MCG (1000 UNIT) tablet Take 1,000 Units by mouth daily.   fluticasone (FLONASE) 50 MCG/ACT nasal spray Place into the nose.   loratadine (CLARITIN) 10 MG tablet Take 10 mg by mouth daily.   magnesium citrate SOLN Take 1 Bottle by mouth once.   montelukast (SINGULAIR) 10 MG tablet Take 1 tablet (10 mg total) by mouth at bedtime.   Multiple Vitamins-Minerals (MULTIVITAMIN ADULT) CHEW Chew 2 each by mouth daily.   rizatriptan (MAXALT-MLT) 10 MG disintegrating tablet Take by mouth.   sodium chloride (OCEAN) 0.65 % SOLN nasal spray Place 1 spray into both nostrils as needed for congestion.   tamoxifen (NOLVADEX) 20 MG tablet Take 1 tablet (20 mg total) by mouth daily.   celecoxib (CELEBREX) 200 MG  capsule Take by mouth. (Patient not taking: Reported on 02/20/2022)   celecoxib (CELEBREX) 200 MG capsule Take by mouth. (Patient not taking: Reported on 02/20/2022)   Omega-3 Fatty Acids (FISH OIL) 1000 MG CAPS Take 3,000 mg by mouth daily. (Patient not taking: Reported on 02/20/2022)   Pseudoephedrine HCl (SUDAFED PO) Take 10 mg by mouth. (Patient not taking: Reported on 02/20/2022)   No facility-administered encounter medications on file as of 02/20/2022.      Medical History: Past Medical History:  Diagnosis Date   Asthma    Cancer ([place])    Family history of breast cancer    History of migraine headaches    Abstract from medicat   Personal history of radiation therapy    Uterine myoma      Vital Signs: There were no vitals taken for this visit.   Review of Systems  Physical Exam    Assessment/Plan: 1. Encounter for vitamin deficiency screening  - VITAMIN D 25 Hydroxy (Vit-D Deficiency, Fractures)  2. Screening for diabetes mellitus  - Hgb A1c w/o eAG  3. Screening cholesterol level  - CMP12+LP+TP+TSH+6AC+CBC/D/Plt  4. Rash Continue hydrocortisone as long as rash is improving, RTC with new or worsening symptoms.       General Counseling: Trenna verbalizes understanding of the findings of todays visit and agrees with plan of treatment. I have discussed any further diagnostic evaluation that may be needed or ordered today. We also reviewed her medications today.  she has been encouraged to call the office with any questions or concerns that should arise related to todays visit.   Time spent:15 [facility] Family Nurse Practitioner

## 2022-02-21 LAB — SPECIMEN STATUS REPORT

## 2022-02-22 LAB — CMP12+LP+TP+TSH+6AC+CBC/D/PLT
ALT: 15 IU/L (ref 0–32)
AST: 16 IU/L (ref 0–40)
Albumin/Globulin Ratio: 2.1 (ref 1.2–2.2)
Albumin: 4.7 g/dL (ref 3.9–4.9)
Alkaline Phosphatase: 107 IU/L (ref 44–121)
BUN/Creatinine Ratio: 18 (ref 9–23)
BUN: 16 mg/dL (ref 6–24)
Basophils Absolute: 0 10*3/uL (ref 0.0–0.2)
Basos: 1 %
Bilirubin Total: 0.2 mg/dL (ref 0.0–1.2)
Calcium: 9.3 mg/dL (ref 8.7–10.2)
Chloride: 108 mmol/L — ABNORMAL HIGH (ref 96–106)
Chol/HDL Ratio: 3.1 ratio (ref 0.0–4.4)
Cholesterol, Total: 191 mg/dL (ref 100–199)
Creatinine, Ser: 0.9 mg/dL (ref 0.57–1.00)
EOS (ABSOLUTE): 0.1 10*3/uL (ref 0.0–0.4)
Eos: 2 %
Estimated CHD Risk: <0.5 times avg. (ref 0.0–1.0)
Free Thyroxine Index: 1.9 (ref 1.2–4.9)
GGT: 19 IU/L (ref 0–60)
Globulin, Total: 2.2 g/dL (ref 1.5–4.5)
Glucose: 97 mg/dL (ref 70–99)
HDL: 61 mg/dL (ref >39)
Hematocrit: 38.9 % (ref 34.0–46.6)
Hemoglobin: 12.9 g/dL (ref 11.1–15.9)
Immature Grans (Abs): 0 10*3/uL (ref 0.0–0.1)
Immature Granulocytes: 0 %
Iron: 59 ug/dL (ref 27–159)
LDH: 197 IU/L (ref 119–226)
LDL Chol Calc (NIH): 113 mg/dL — ABNORMAL HIGH (ref 0–99)
Lymphocytes Absolute: 1.8 10*3/uL (ref 0.7–3.1)
Lymphs: 35 %
MCH: 30.9 pg (ref 26.6–33.0)
MCHC: 33.2 g/dL (ref 31.5–35.7)
MCV: 93 fL (ref 79–97)
Monocytes Absolute: 0.5 10*3/uL (ref 0.1–0.9)
Monocytes: 10 %
Neutrophils Absolute: 2.7 10*3/uL (ref 1.4–7.0)
Neutrophils: 52 %
Phosphorus: 3.6 mg/dL (ref 3.0–4.3)
Platelets: 253 10*3/uL (ref 150–450)
Potassium: 4.5 mmol/L (ref 3.5–5.2)
RBC: 4.18 x10E6/uL (ref 3.77–5.28)
RDW: 12.7 % (ref 11.7–15.4)
Sodium: 145 mmol/L — ABNORMAL HIGH (ref 134–144)
T3 Uptake Ratio: 27 % (ref 24–39)
T4, Total: 6.9 ug/dL (ref 4.5–12.0)
TSH: 1.38 u[IU]/mL (ref 0.450–4.500)
Total Protein: 6.9 g/dL (ref 6.0–8.5)
Triglycerides: 95 mg/dL (ref 0–149)
Uric Acid: 4.7 mg/dL (ref 2.6–6.2)
VLDL Cholesterol Cal: 17 mg/dL (ref 5–40)
WBC: 5.2 10*3/uL (ref 3.4–10.8)
eGFR: 82 mL/min/{1.73_m2} (ref >59)

## 2022-02-22 LAB — VITAMIN D 25 HYDROXY (VIT D DEFICIENCY, FRACTURES): Vit D, 25-Hydroxy: 33.6 ng/mL (ref 30.0–100.0)

## 2022-02-22 LAB — HGB A1C W/O EAG: Hgb A1c MFr Bld: 5.1 % (ref 4.8–5.6)

## 2022-02-27 ENCOUNTER — Encounter: Payer: Self-pay | Admitting: Nurse Practitioner

## 2022-03-04 ENCOUNTER — Ambulatory Visit (INDEPENDENT_AMBULATORY_CARE_PROVIDER_SITE_OTHER): Payer: Self-pay | Admitting: Physician Assistant

## 2022-03-04 ENCOUNTER — Encounter: Payer: Self-pay | Admitting: Physician Assistant

## 2022-03-04 VITALS — BP 120/78 | HR 102 | Temp 99.2°F

## 2022-03-04 DIAGNOSIS — J40 Bronchitis, not specified as acute or chronic: Secondary | ICD-10-CM

## 2022-03-04 DIAGNOSIS — R490 Dysphonia: Secondary | ICD-10-CM

## 2022-03-04 MED ORDER — METHYLPREDNISOLONE 4 MG PO TBPK: ORAL_TABLET | ORAL | 0 refills | Status: DC

## 2022-03-04 NOTE — Progress Notes (Signed)
[facility] [address], [place] [address]   Office Visit Note  Patient Name: Madison Sullivan Date of Birth [id number]  Medical Record number [id number]  Date of Service: 03/04/2022  Chief Complaint  Patient presents with   Sinusitis    Started last week. ST, cough. Tested for COVID twice last week was Neg. No fever or Headaches. Cough has yellow/greenish mucus.      41 y/o F presents to the clinic for c/o cough(worse at night), occasional wheezing, hoarseness of voice, and feeling of ear clogging. She recently traveled from [place] for conference  and noticed her symptoms start while at the conference. No fever, CP, or body aches. +h/o asthma for which is well controlled with Singulair and oral anti-histamine. No recent asthma exacerbation.   Sinusitis Associated symptoms include congestion and coughing. Pertinent negatives include no shortness of breath, sinus pressure or sore throat.      Current Medication:  Outpatient Encounter Medications as of 03/04/2022  Medication Sig   acetaminophen (TYLENOL) 325 MG tablet Take 650 mg by mouth every 6 (six) hours as needed for moderate pain.   Acetaminophen-Caffeine (TENSION HEADACHE) 500-65 MG TABS Take 2 tablets by mouth 2 (two) times daily as needed (HEADACHE).   albuterol (PROVENTIL HFA;VENTOLIN HFA) 108 (90 Base) MCG/ACT inhaler Inhale 2 puffs into the lungs every 6 (six) hours as needed for wheezing or shortness of breath.   cholecalciferol (VITAMIN D3) 25 MCG (1000 UNIT) tablet Take 1,000 Units by mouth daily.   fluticasone (FLONASE) 50 MCG/ACT nasal spray Place into the nose.   loratadine (CLARITIN) 10 MG tablet Take 10 mg by mouth daily.   magnesium citrate SOLN Take 1 Bottle by mouth once.   methylPREDNISolone (MEDROL DOSEPAK) 4 MG TBPK tablet Take as directed   montelukast (SINGULAIR) 10 MG tablet Take 1 tablet (10 mg total) by mouth at bedtime.   Multiple Vitamins-Minerals (MULTIVITAMIN ADULT) CHEW Chew 2 each  by mouth daily.   rizatriptan (MAXALT-MLT) 10 MG disintegrating tablet Take by mouth.   sodium chloride (OCEAN) 0.65 % SOLN nasal spray Place 1 spray into both nostrils as needed for congestion.   tamoxifen (NOLVADEX) 20 MG tablet Take 1 tablet (20 mg total) by mouth daily.   celecoxib (CELEBREX) 200 MG capsule Take by mouth. (Patient not taking: Reported on 02/20/2022)   celecoxib (CELEBREX) 200 MG capsule Take by mouth. (Patient not taking: Reported on 02/20/2022)   Omega-3 Fatty Acids (FISH OIL) 1000 MG CAPS Take 3,000 mg by mouth daily. (Patient not taking: Reported on 02/20/2022)   Pseudoephedrine HCl (SUDAFED PO) Take 10 mg by mouth. (Patient not taking: Reported on 02/20/2022)   No facility-administered encounter medications on file as of 03/04/2022.      Medical History: Past Medical History:  Diagnosis Date   Asthma    Cancer ([place])    Family history of breast cancer    History of migraine headaches    Abstract from medicat   Personal history of radiation therapy    Uterine myoma      Vital Signs: BP 120/78 (BP Location: Left Arm, Patient Position: Sitting, Cuff Size: Normal)   Pulse (!) 102   Temp 99.2 F (37.3 C) (Tympanic)   SpO2 98%    Review of Systems  Constitutional: Negative.   HENT:  Positive for congestion and postnasal drip. Negative for sinus pressure, sinus pain, sore throat and trouble swallowing.   Respiratory:  Positive for cough and wheezing (occasional with cough at  night). Negative for chest tightness and shortness of breath.   Cardiovascular: Negative.   Neurological: Negative.     Physical Exam Constitutional:      Appearance: Normal appearance.  HENT:     Head: Atraumatic.     Right Ear: Tympanic membrane, ear canal and external ear normal.     Left Ear: Tympanic membrane, ear canal and external ear normal.     Nose: Nose normal.     Right Turbinates: Enlarged.     Mouth/Throat:     Mouth: Mucous membranes are moist.     Pharynx:  Oropharynx is clear.  Eyes:     Extraocular Movements: Extraocular movements intact.  Cardiovascular:     Rate and Rhythm: Normal rate and regular rhythm.  Pulmonary:     Effort: Pulmonary effort is normal. No respiratory distress.     Breath sounds: Normal breath sounds. No wheezing or rhonchi.  Musculoskeletal:     Cervical back: Neck supple.  Skin:    General: Skin is warm.  Neurological:     Mental Status: She is alert.  Psychiatric:        Mood and Affect: Mood normal.        Behavior: Behavior normal.        Thought Content: Thought content normal.        Judgment: Judgment normal.       Assessment/Plan:  1. Bronchitis - methylPREDNISolone (MEDROL DOSEPAK) 4 MG TBPK tablet; Take as directed  Dispense: 1 each; Refill: 0  2. Hoarseness of voice  Increase fluids Rest Voice Purchase and start a humidifier Take an extended warm showers Start Methylpredinsolone as prescribed. Recommended Mucinex to help with break up congestion. Pt will consider, but will not purchase it for now.  Deferred oral antibiotic at the present time. Will consider if symptoms don't improve. Pt verbalized understanding and in agreement.  Continue to watch for worsening symptoms. RTC prn Pt verbalized understanding and in agreement.  Today's visit is a 25 minute F2F encounter.    General Counseling: Madison Sullivan verbalizes understanding of the findings of todays visit and agrees with plan of treatment. I have discussed any further diagnostic evaluation that may be needed or ordered today. We also reviewed her medications today. she has been encouraged to call the office with any questions or concerns that should arise related to todays visit.    Time spent:25 [place], [place] Physician Assistant

## 2022-04-08 ENCOUNTER — Ambulatory Visit: Payer: BC Managed Care – PPO | Admitting: Dermatology

## 2022-04-15 ENCOUNTER — Ambulatory Visit (INDEPENDENT_AMBULATORY_CARE_PROVIDER_SITE_OTHER): Payer: Self-pay | Admitting: Physician Assistant

## 2022-04-15 ENCOUNTER — Encounter: Payer: Self-pay | Admitting: Physician Assistant

## 2022-04-15 VITALS — BP 120/80 | HR 98 | Temp 98.2°F | Wt 151.0 lb

## 2022-04-15 DIAGNOSIS — J069 Acute upper respiratory infection, unspecified: Secondary | ICD-10-CM

## 2022-04-15 DIAGNOSIS — H6691 Otitis media, unspecified, right ear: Secondary | ICD-10-CM

## 2022-04-15 MED ORDER — AMOXICILLIN 500 MG PO CAPS: 500.0000 mg | ORAL_CAPSULE | Freq: Three times a day (TID) | ORAL | 0 refills | Status: AC

## 2022-04-15 NOTE — Progress Notes (Signed)
[facility] [address], [place] [address]   Office Visit Note  Patient Name: Madison Sullivan Date of Birth [id number]  Medical Record number [id number]  Date of Service: 04/15/2022  Chief Complaint  Patient presents with   Ear Pain    Started with a cold and was traveling back and now ears are clogged. Cough with green mucus. Taking prednisone last dose is today. and OTC meds.      41 y/o F presents to the clinic for c/o URI symptoms. +cough(improving), nasal congestion, head pressure and pain as well as rhinorrhea. Pt traveled to [place] last week and on flight felt increase pain in the R ear. Denies fever, body aches, wheezing, SOB, or CP. Tested negative for Covid. Has been taking oral Prednisone prescribed in [place] while traveling.       Current Medication:  Outpatient Encounter Medications as of 04/15/2022  Medication Sig   albuterol (PROVENTIL HFA;VENTOLIN HFA) 108 (90 Base) MCG/ACT inhaler Inhale 2 puffs into the lungs every 6 (six) hours as needed for wheezing or shortness of breath.   amoxicillin (AMOXIL) 500 MG capsule Take 1 capsule (500 mg total) by mouth 3 (three) times daily for 7 days.   cholecalciferol (VITAMIN D3) 25 MCG (1000 UNIT) tablet Take 1,000 Units by mouth daily.   fluticasone (FLONASE) 50 MCG/ACT nasal spray Place into the nose.   loratadine (CLARITIN) 10 MG tablet Take 10 mg by mouth daily.   magnesium citrate SOLN Take 1 Bottle by mouth once.   methylPREDNISolone (MEDROL DOSEPAK) 4 MG TBPK tablet Take as directed   montelukast (SINGULAIR) 10 MG tablet Take 1 tablet (10 mg total) by mouth at bedtime.   Multiple Vitamins-Minerals (MULTIVITAMIN ADULT) CHEW Chew 2 each by mouth daily.   sodium chloride (OCEAN) 0.65 % SOLN nasal spray Place 1 spray into both nostrils as needed for congestion.   tamoxifen (NOLVADEX) 20 MG tablet Take 1 tablet (20 mg total) by mouth daily.   acetaminophen (TYLENOL) 325 MG tablet Take 650 mg by mouth every 6  (six) hours as needed for moderate pain.   Acetaminophen-Caffeine (TENSION HEADACHE) 500-65 MG TABS Take 2 tablets by mouth 2 (two) times daily as needed (HEADACHE).   celecoxib (CELEBREX) 200 MG capsule Take by mouth. (Patient not taking: Reported on 02/20/2022)   celecoxib (CELEBREX) 200 MG capsule Take by mouth. (Patient not taking: Reported on 02/20/2022)   Omega-3 Fatty Acids (FISH OIL) 1000 MG CAPS Take 3,000 mg by mouth daily. (Patient not taking: Reported on 02/20/2022)   Pseudoephedrine HCl (SUDAFED PO) Take 10 mg by mouth. (Patient not taking: Reported on 02/20/2022)   rizatriptan (MAXALT-MLT) 10 MG disintegrating tablet Take by mouth.   No facility-administered encounter medications on file as of 04/15/2022.      Medical History: Past Medical History:  Diagnosis Date   Asthma    Cancer ([place])    Family history of breast cancer    History of migraine headaches    Abstract from medicat   Personal history of radiation therapy    Uterine myoma      Vital Signs: BP 120/80 (BP Location: Left Arm, Patient Position: Sitting, Cuff Size: Normal)   Pulse 98   Temp 98.2 F (36.8 C) (Tympanic)   Wt 151 lb (68.5 kg)   SpO2 98%   BMI 22.30 kg/m    Review of Systems  Constitutional: Negative.   HENT:  Positive for congestion, ear pain, postnasal drip, rhinorrhea, sinus pressure and sinus  pain. Negative for ear discharge, nosebleeds, sore throat and trouble swallowing.   Respiratory: Negative.    Cardiovascular: Negative.   Gastrointestinal:  Negative for nausea and vomiting.  Neurological: Negative.     Physical Exam Constitutional:      Appearance: Normal appearance.  HENT:     Head: Atraumatic.     Right Ear: Ear canal and external ear normal. There is no impacted cerumen. Tympanic membrane is injected, scarred, erythematous and bulging. Tympanic membrane is not perforated.     Left Ear: Ear canal and external ear normal. There is no impacted cerumen. Tympanic membrane is  erythematous. Tympanic membrane is not injected, scarred, perforated or bulging.     Ears:     Comments: L TM appears to have air-fluid level.    Nose: Nose normal.     Mouth/Throat:     Mouth: Mucous membranes are moist.     Pharynx: Oropharynx is clear.  Eyes:     Extraocular Movements: Extraocular movements intact.  Cardiovascular:     Rate and Rhythm: Normal rate and regular rhythm.  Pulmonary:     Effort: Pulmonary effort is normal.     Breath sounds: Normal breath sounds.  Musculoskeletal:     Cervical back: Neck supple.  Skin:    General: Skin is warm.  Neurological:     Mental Status: She is alert.  Psychiatric:        Mood and Affect: Mood normal.        Behavior: Behavior normal.        Thought Content: Thought content normal.        Judgment: Judgment normal.       Assessment/Plan:  1. Right otitis media, unspecified otitis media type - amoxicillin (AMOXIL) 500 MG capsule; Take 1 capsule (500 mg total) by mouth 3 (three) times daily for 7 days.  Dispense: 21 capsule; Refill: 0  2. URI with cough and congestion  Increase fluids Start a humidifier Discontinue nasal decongestant Start oral decongestant ie Sudafed as directed on the box If symptoms don't improve then start oral antibiotic as prescribed. Pt verbalized understanding and in agreement.   General Counseling: Marleni verbalizes understanding of the findings of todays visit and agrees with plan of treatment. I have discussed any further diagnostic evaluation that may be needed or ordered today. We also reviewed her medications today. she has been encouraged to call the office with any questions or concerns that should arise related to todays visit.    Time spent:20 [place], [place] Physician Assistant

## 2022-04-24 ENCOUNTER — Encounter: Payer: Self-pay | Admitting: Nurse Practitioner

## 2022-04-24 ENCOUNTER — Ambulatory Visit (INDEPENDENT_AMBULATORY_CARE_PROVIDER_SITE_OTHER): Payer: Self-pay | Admitting: Nurse Practitioner

## 2022-04-24 VITALS — HR 108 | Temp 99.6°F

## 2022-04-24 DIAGNOSIS — H6990 Unspecified Eustachian tube disorder, unspecified ear: Secondary | ICD-10-CM

## 2022-04-24 NOTE — Patient Instructions (Signed)
Sudafed start 24 hours prior to flying continue day of flying and then stop until return flight  Afrin nasal spray day of flying there and back only *use must limited*  Continue Singulair and Loratadine daily  Restart Flonase daily continue through visit

## 2022-04-24 NOTE — Progress Notes (Signed)
[facility] [address], [place] [address] [phone]  Office Visit Note  Patient Name: Madison Sullivan Date of Birth [id number]  Medical Record number [id number]  Date of Service: 04/24/2022  Chief Complaint  Patient presents with   Follow-up    Left ear popping and clogged. Started taking antibiotic mucus is clear now. Flying next week.  Antibiotic finished yesterday. Coughing some.     HPI 41 year old returning to the clinic with ongoing left ear pressure.  She has taken nasal sprays, sudafed, antibiotics (finished last night), and prednisone.   Overall she has been improving her drainage is clear.   Has been off nasal sprays for the past week  Continues on Singulair  Last does of sudafed was yesterday   Denies any recent fevers.   Is planning to fly again next week   Current Medication:  Outpatient Encounter Medications as of 04/24/2022  Medication Sig   cholecalciferol (VITAMIN D3) 25 MCG (1000 UNIT) tablet Take 1,000 Units by mouth daily.   fluticasone (FLONASE) 50 MCG/ACT nasal spray Place into the nose.   loratadine (CLARITIN) 10 MG tablet Take 10 mg by mouth daily.   magnesium citrate SOLN Take 1 Bottle by mouth once.   montelukast (SINGULAIR) 10 MG tablet Take 1 tablet (10 mg total) by mouth at bedtime.   Multiple Vitamins-Minerals (MULTIVITAMIN ADULT) CHEW Chew 2 each by mouth daily.   sodium chloride (OCEAN) 0.65 % SOLN nasal spray Place 1 spray into both nostrils as needed for congestion.   tamoxifen (NOLVADEX) 20 MG tablet Take 1 tablet (20 mg total) by mouth daily.   acetaminophen (TYLENOL) 325 MG tablet Take 650 mg by mouth every 6 (six) hours as needed for moderate pain.   Acetaminophen-Caffeine (TENSION HEADACHE) 500-65 MG TABS Take 2 tablets by mouth 2 (two) times daily as needed (HEADACHE).   albuterol (PROVENTIL HFA;VENTOLIN HFA) 108 (90 Base) MCG/ACT inhaler Inhale 2 puffs into the lungs every 6 (six) hours as needed for wheezing or  shortness of breath.   celecoxib (CELEBREX) 200 MG capsule Take by mouth. (Patient not taking: Reported on 02/20/2022)   celecoxib (CELEBREX) 200 MG capsule Take by mouth. (Patient not taking: Reported on 02/20/2022)   methylPREDNISolone (MEDROL DOSEPAK) 4 MG TBPK tablet Take as directed (Patient not taking: Reported on 04/24/2022)   Omega-3 Fatty Acids (FISH OIL) 1000 MG CAPS Take 3,000 mg by mouth daily. (Patient not taking: Reported on 02/20/2022)   Pseudoephedrine HCl (SUDAFED PO) Take 10 mg by mouth. (Patient not taking: Reported on 02/20/2022)   rizatriptan (MAXALT-MLT) 10 MG disintegrating tablet Take by mouth.   No facility-administered encounter medications on file as of 04/24/2022.      Medical History: Past Medical History:  Diagnosis Date   Asthma    Cancer ([place])    Family history of breast cancer    History of migraine headaches    Abstract from medicat   Personal history of radiation therapy    Uterine myoma      Vital Signs: Pulse (!) 108   Temp 99.6 F (37.6 C) (Tympanic)   SpO2 98%    Review of Systems  Constitutional: Negative.   HENT:  Positive for ear pain.   Respiratory: Negative.    Cardiovascular: Negative.   Gastrointestinal: Negative.   Genitourinary: Negative.   Neurological: Negative.        Physical Exam HENT:     Head: Normocephalic.     Right Ear: A middle ear  effusion is present. Tympanic membrane is not injected or erythematous.     Left Ear: A middle ear effusion is present. Tympanic membrane is not injected or erythematous.     Nose: Nose normal.     Mouth/Throat:     Lips: Pink.     Mouth: Mucous membranes are moist.     Pharynx: Oropharynx is clear.  Pulmonary:     Effort: Pulmonary effort is normal.  Neurological:     General: No focal deficit present.     Mental Status: She is alert.  Psychiatric:        Mood and Affect: Mood normal.       Assessment/Plan: 1. Dysfunction of Eustachian tube, unspecified  laterality Sudafed start 24 hours prior to flying continue day of flying and then stop until return flight  Afrin nasal spray day of flying there and back only *use must limited*  Continue Singulair and Loratadine daily  Restart Flonase daily continue through visit     General Counseling: Dail verbalizes understanding of the findings of todays visit and agrees with plan of treatment. I have discussed any further diagnostic evaluation that may be needed or ordered today. We also reviewed her medications today. she has been encouraged to call the office with any questions or concerns that should arise related to todays visit.     Time spent:10 [place] [facility] Nurse Practitioner

## 2022-04-25 ENCOUNTER — Ambulatory Visit: Payer: BC Managed Care – PPO | Admitting: Dermatology

## 2022-04-25 DIAGNOSIS — D225 Melanocytic nevi of trunk: Secondary | ICD-10-CM | POA: Diagnosis not present

## 2022-04-25 DIAGNOSIS — L578 Other skin changes due to chronic exposure to nonionizing radiation: Secondary | ICD-10-CM | POA: Diagnosis not present

## 2022-04-25 DIAGNOSIS — Z853 Personal history of malignant neoplasm of breast: Secondary | ICD-10-CM | POA: Diagnosis not present

## 2022-04-25 DIAGNOSIS — D2261 Melanocytic nevi of right upper limb, including shoulder: Secondary | ICD-10-CM

## 2022-04-25 DIAGNOSIS — D485 Neoplasm of uncertain behavior of skin: Secondary | ICD-10-CM

## 2022-04-25 DIAGNOSIS — Z1283 Encounter for screening for malignant neoplasm of skin: Secondary | ICD-10-CM

## 2022-04-25 DIAGNOSIS — D492 Neoplasm of unspecified behavior of bone, soft tissue, and skin: Secondary | ICD-10-CM

## 2022-04-25 DIAGNOSIS — L821 Other seborrheic keratosis: Secondary | ICD-10-CM

## 2022-04-25 DIAGNOSIS — D239 Other benign neoplasm of skin, unspecified: Secondary | ICD-10-CM

## 2022-04-25 DIAGNOSIS — L814 Other melanin hyperpigmentation: Secondary | ICD-10-CM

## 2022-04-25 DIAGNOSIS — D229 Melanocytic nevi, unspecified: Secondary | ICD-10-CM

## 2022-04-25 HISTORY — DX: Other benign neoplasm of skin, unspecified: D23.9

## 2022-04-25 NOTE — Progress Notes (Signed)
New Patient Visit  Subjective  Madison Sullivan is a 41 y.o. female who presents for the following: Annual Exam (The patient presents for Total-Body Skin Exam (TBSE) for skin cancer screening and mole check.  The patient has spots, moles and lesions to be evaluated, some may be new or changing and the patient has concerns that these could be cancer. ).  The following portions of the chart were reviewed this encounter and updated as appropriate:   Tobacco  Allergies  Meds  Problems  Med Hx  Surg Hx  Fam Hx     Review of Systems:  No other skin or systemic complaints except as noted in HPI or Assessment and Plan.  Objective  Well appearing patient in no apparent distress; mood and affect are within normal limits.  A full examination was performed including scalp, head, eyes, ears, nose, lips, neck, chest, axillae, abdomen, back, buttocks, bilateral upper extremities, bilateral lower extremities, hands, feet, fingers, toes, fingernails, and toenails. All findings within normal limits unless otherwise noted below.  left mid back paraspinal 0.6 cm  irregular brown macule      right medial breast 0.7 cm  irregular brown macule      right mid lateral tricep 0.6 cm irregular brown macule         Assessment & Plan  History of breast cancer Left Breast - No lymphadenopathy - Recommend regular full body skin exams - Recommend daily broad spectrum sunscreen SPF 30+ to sun-exposed areas, reapply every 2 hours as needed.  - Call if any new or changing lesions are noted between office visits   Neoplasm of skin (3) left mid back paraspinal Epidermal / dermal shaving  Lesion diameter (cm):  0.6 Informed consent: discussed and consent obtained   Patient was prepped and draped in usual sterile fashion: area prepped with alcohol. Anesthesia: the lesion was anesthetized in a standard fashion   Anesthetic:  1% lidocaine w/ epinephrine 1-100,000 buffered w/ 8.4% NaHCO3 Instrument  used: flexible razor blade   Hemostasis achieved with: pressure, aluminum chloride and electrodesiccation   Outcome: patient tolerated procedure well   Post-procedure details: wound care instructions given   Post-procedure details comment:  Ointment and small bandage applied  Specimen 1 - Surgical pathology Differential Diagnosis: R/O Dysplastic nevus vs other  Check Margins: No  right medial breast Epidermal / dermal shaving  Lesion diameter (cm):  0.7 Informed consent: discussed and consent obtained   Patient was prepped and draped in usual sterile fashion: area prepped with alcohol. Anesthesia: the lesion was anesthetized in a standard fashion   Anesthetic:  1% lidocaine w/ epinephrine 1-100,000 buffered w/ 8.4% NaHCO3 Instrument used: flexible razor blade   Hemostasis achieved with: pressure, aluminum chloride and electrodesiccation   Outcome: patient tolerated procedure well   Post-procedure details: wound care instructions given   Post-procedure details comment:  Ointment and small bandage applied  Specimen 2 - Surgical pathology Differential Diagnosis: R/O Dysplastic nevus vs other  Check Margins: No  right mid lateral tricep Epidermal / dermal shaving  Lesion diameter (cm):  0.6 Informed consent: discussed and consent obtained   Timeout: patient name, date of birth, surgical site, and procedure verified   Procedure prep:  Patient was prepped and draped in usual sterile fashion Prep type:  Isopropyl alcohol Anesthesia: the lesion was anesthetized in a standard fashion   Anesthetic:  1% lidocaine w/ epinephrine 1-100,000 buffered w/ 8.4% NaHCO3 Instrument used: flexible razor blade   Hemostasis achieved with: pressure, aluminum  chloride and electrodesiccation   Outcome: patient tolerated procedure well   Post-procedure details: sterile dressing applied and wound care instructions given   Post-procedure details comment:  Ointment and small bandage applied Dressing  type: bandage and petrolatum    Specimen 3 - Surgical pathology Differential Diagnosis: R/O Dysplastic nevus vs other  Check Margins: No  Lentigines - Scattered tan macules - Due to sun exposure - Benign-appearing, observe - Recommend daily broad spectrum sunscreen SPF 30+ to sun-exposed areas, reapply every 2 hours as needed. - Call for any changes  Seborrheic Keratoses - Stuck-on, waxy, tan-brown papules and/or plaques  - Benign-appearing - Discussed benign etiology and prognosis. - Observe - Call for any changes  Melanocytic Nevi - Tan-brown and/or pink-flesh-colored symmetric macules and papules - Benign appearing on exam today - Observation - Call clinic for new or changing moles - Recommend daily use of broad spectrum spf 30+ sunscreen to sun-exposed areas.   Hemangiomas - Red papules - Discussed benign nature - Observe - Call for any changes  Actinic Damage - Chronic condition, secondary to cumulative UV/sun exposure - diffuse scaly erythematous macules with underlying dyspigmentation - Recommend daily broad spectrum sunscreen SPF 30+ to sun-exposed areas, reapply every 2 hours as needed.  - Staying in the shade or wearing long sleeves, sun glasses (UVA+UVB protection) and wide brim hats (4-inch brim around the entire circumference of the hat) are also recommended for sun protection.  - Call for new or changing lesions.   Skin cancer screening performed today.   Return in about 4 months (around 08/25/2022) for remove irritated moles .  IMarye Round, CMA, am acting as scribe for Sarina Ser, MD .  Documentation: I have reviewed the above documentation for accuracy and completeness, and I agree with the above.  Sarina Ser, MD

## 2022-04-25 NOTE — Patient Instructions (Addendum)
Wound Care Instructions  Cleanse wound gently with soap and water once a day then pat dry with clean gauze. Apply a thin coat of Petrolatum (petroleum jelly, "Vaseline") over the wound (unless you have an allergy to this). We recommend that you use a new, sterile tube of Vaseline. Do not pick or remove scabs. Do not remove the yellow or white "healing tissue" from the base of the wound.  Cover the wound with fresh, clean, nonstick gauze and secure with paper tape. You may use Band-Aids in place of gauze and tape if the wound is small enough, but would recommend trimming much of the tape off as there is often too much. Sometimes Band-Aids can irritate the skin.  You should call the office for your biopsy report after 1 week if you have not already been contacted.  If you experience any problems, such as abnormal amounts of bleeding, swelling, significant bruising, significant pain, or evidence of infection, please call the office immediately.  FOR ADULT SURGERY PATIENTS: If you need something for pain relief you may take 1 extra strength Tylenol (acetaminophen) AND 2 Ibuprofen (200mg each) together every 4 hours as needed for pain. (do not take these if you are allergic to them or if you have a reason you should not take them.) Typically, you may only need pain medication for 1 to 3 days.     Due to recent changes in healthcare laws, you may see results of your pathology and/or laboratory studies on MyChart before the doctors have had a chance to review them. We understand that in some cases there may be results that are confusing or concerning to you. Please understand that not all results are received at the same time and often the doctors may need to interpret multiple results in order to provide you with the best plan of care or course of treatment. Therefore, we ask that you please give us 2 business days to thoroughly review all your results before contacting the office for clarification. Should  we see a critical lab result, you will be contacted sooner.   If You Need Anything After Your Visit  If you have any questions or concerns for your doctor, please call our main line at [phone] and press option 4 to reach your doctor's medical assistant. If no one answers, please leave a voicemail as directed and we will return your call as soon as possible. Messages left after 4 pm will be answered the following business day.   You may also send us a message via MyChart. We typically respond to MyChart messages within 1-2 business days.  For prescription refills, please ask your pharmacy to contact our office. Our fax number is [phone].  If you have an urgent issue when the clinic is closed that cannot wait until the next business day, you can page your doctor at the number below.    Please note that while we do our best to be available for urgent issues outside of office hours, we are not available 24/7.   If you have an urgent issue and are unable to reach us, you may choose to seek medical care at your doctor's office, retail clinic, urgent care center, or emergency room.  If you have a medical emergency, please immediately call 911 or go to the emergency department.  Pager Numbers  - Dr. Kowalski: [phone]  - Dr. Moye: [phone]  - Dr. Stewart: [phone]  In the event of inclement weather, please call our main line at   [phone] for an update on the status of any delays or closures.  Dermatology Medication Tips: Please keep the boxes that topical medications come in in order to help keep track of the instructions about where and how to use these. Pharmacies typically print the medication instructions only on the boxes and not directly on the medication tubes.   If your medication is too expensive, please contact our office at [phone] option 4 or send us a message through MyChart.   We are unable to tell what your co-pay for medications will be in  advance as this is different depending on your insurance coverage. However, we may be able to find a substitute medication at lower cost or fill out paperwork to get insurance to cover a needed medication.   If a prior authorization is required to get your medication covered by your insurance company, please allow us 1-2 business days to complete this process.  Drug prices often vary depending on where the prescription is filled and some pharmacies may offer cheaper prices.  The website www.goodrx.com contains coupons for medications through different pharmacies. The prices here do not account for what the cost may be with help from insurance (it may be cheaper with your insurance), but the website can give you the price if you did not use any insurance.  - You can print the associated coupon and take it with your prescription to the pharmacy.  - You may also stop by our office during regular business hours and pick up a GoodRx coupon card.  - If you need your prescription sent electronically to a different pharmacy, notify our office through [facility] MyChart or by phone at [phone] option 4.     Si Usted Necesita Algo Despus de Su Visita  Tambin puede enviarnos un mensaje a travs de MyChart. Por lo general respondemos a los mensajes de MyChart en el transcurso de 1 a 2 das hbiles.  Para renovar recetas, por favor pida a su farmacia que se ponga en contacto con nuestra oficina. Nuestro nmero de fax es el [phone].  Si tiene un asunto urgente cuando la clnica est cerrada y que no puede esperar hasta el siguiente da hbil, puede llamar/localizar a su doctor(a) al nmero que aparece a continuacin.   Por favor, tenga en cuenta que aunque hacemos todo lo posible para estar disponibles para asuntos urgentes fuera del horario de oficina, no estamos disponibles las 24 horas del da, los 7 das de la semana.   Si tiene un problema urgente y no puede comunicarse con nosotros, puede  optar por buscar atencin mdica  en el consultorio de su doctor(a), en una clnica privada, en un centro de atencin urgente o en una sala de emergencias.  Si tiene una emergencia mdica, por favor llame inmediatamente al 911 o vaya a la sala de emergencias.  Nmeros de bper  - Dr. Kowalski: [phone]  - Dra. Moye: [phone]  - Dra. Stewart: [phone]  En caso de inclemencias del tiempo, por favor llame a nuestra lnea principal al [phone] para una actualizacin sobre el estado de cualquier retraso o cierre.  Consejos para la medicacin en dermatologa: Por favor, guarde las cajas en las que vienen los medicamentos de uso tpico para ayudarle a seguir las instrucciones sobre dnde y cmo usarlos. Las farmacias generalmente imprimen las instrucciones del medicamento slo en las cajas y no directamente en los tubos del medicamento.   Si su medicamento es muy caro, por favor, pngase en contacto con   nuestra oficina llamando al [phone] y presione la opcin 4 o envenos un mensaje a travs de MyChart.   No podemos decirle cul ser su copago por los medicamentos por adelantado ya que esto es diferente dependiendo de la cobertura de su seguro. Sin embargo, es posible que podamos encontrar un medicamento sustituto a menor costo o llenar un formulario para que el seguro cubra el medicamento que se considera necesario.   Si se requiere una autorizacin previa para que su compaa de seguros cubra su medicamento, por favor permtanos de 1 a 2 das hbiles para completar este proceso.  Los precios de los medicamentos varan con frecuencia dependiendo del lugar de dnde se surte la receta y alguna farmacias pueden ofrecer precios ms baratos.  El sitio web www.goodrx.com tiene cupones para medicamentos de diferentes farmacias. Los precios aqu no tienen en cuenta lo que podra costar con la ayuda del seguro (puede ser ms barato con su seguro), pero el sitio web puede darle el  precio si no utiliz ningn seguro.  - Puede imprimir el cupn correspondiente y llevarlo con su receta a la farmacia.  - Tambin puede pasar por nuestra oficina durante el horario de atencin regular y recoger una tarjeta de cupones de GoodRx.  - Si necesita que su receta se enve electrnicamente a una farmacia diferente, informe a nuestra oficina a travs de MyChart de [facility] o por telfono llamando al [phone] y presione la opcin 4.  

## 2022-05-05 ENCOUNTER — Encounter: Payer: Self-pay | Admitting: Dermatology

## 2022-05-06 IMAGING — MG MM BREAST LOCALIZATION CLIP
4 series · 4 of 12 positions shown · non-contrast
Comparison: Previous exam(s).

CLINICAL DATA: Evaluate biopsy marker.

EXAM:
3D DIAGNOSTIC LEFT MAMMOGRAM POST ULTRASOUND BIOPSY

[L CC synth-2D]
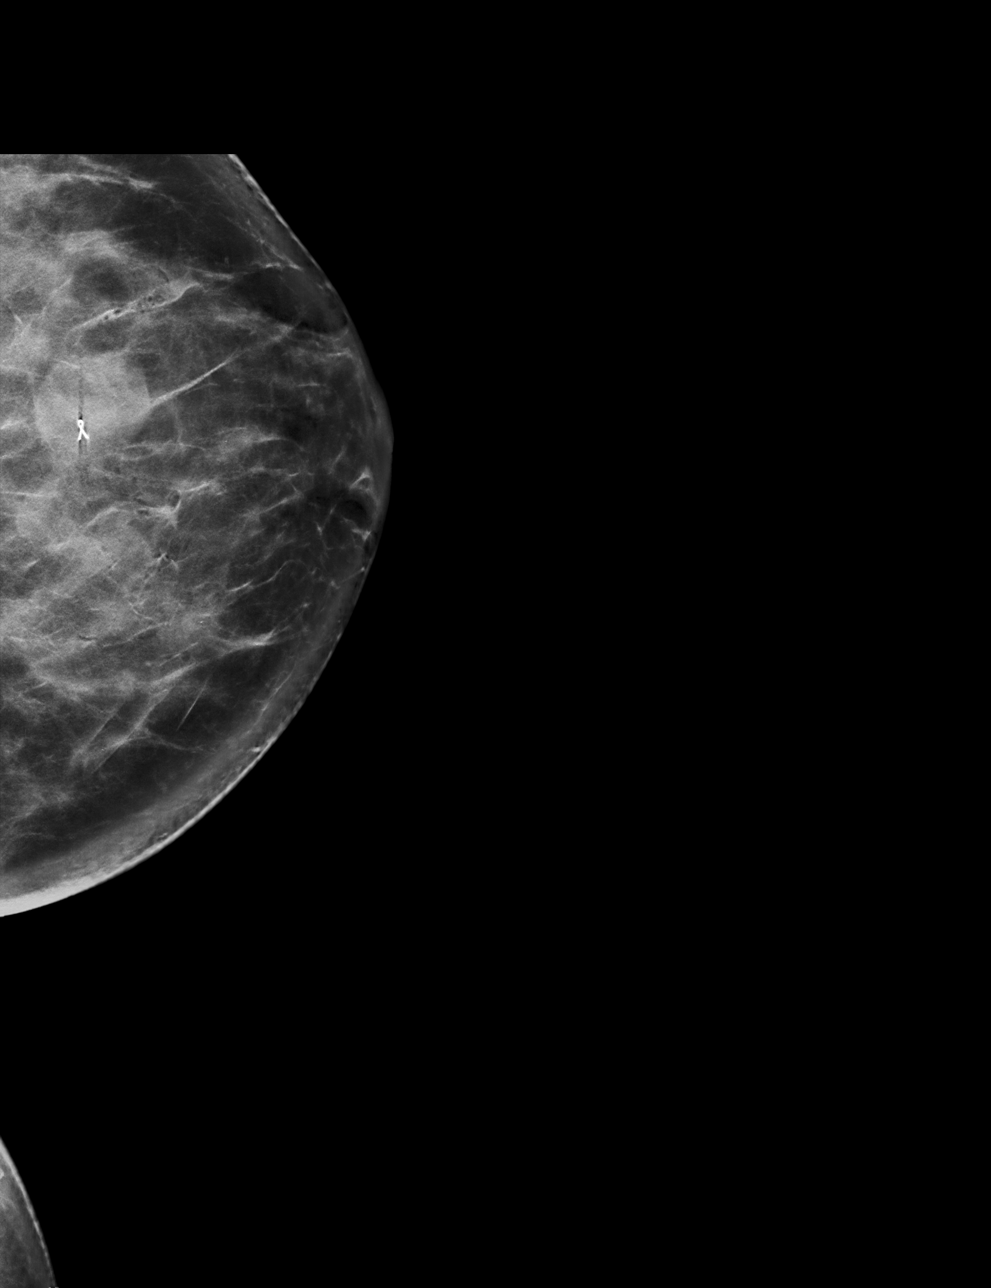

[L ML synth-2D]
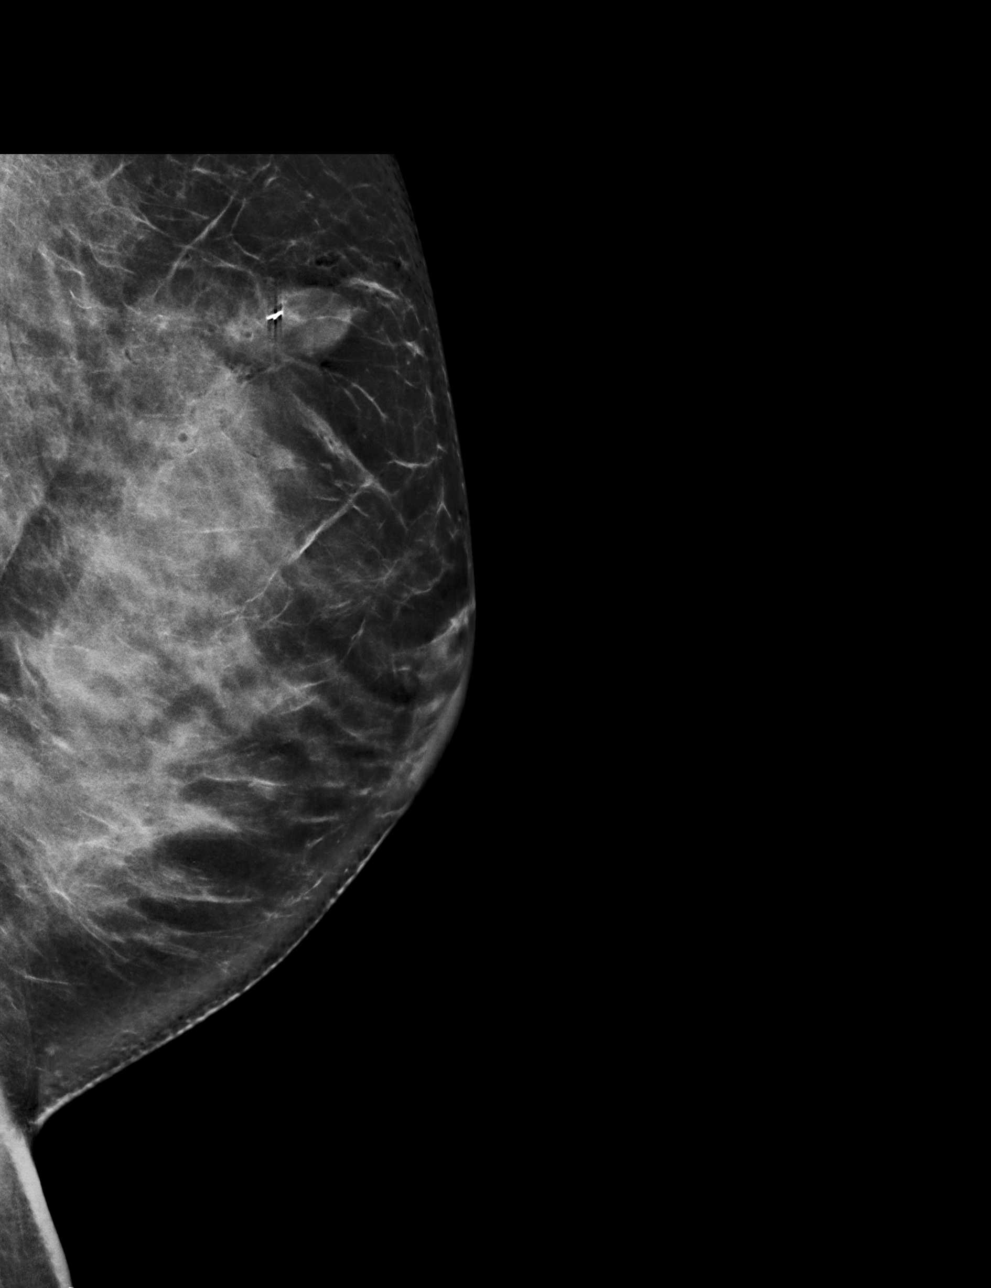

[L CC tomo · tomo slice 40/79.0]
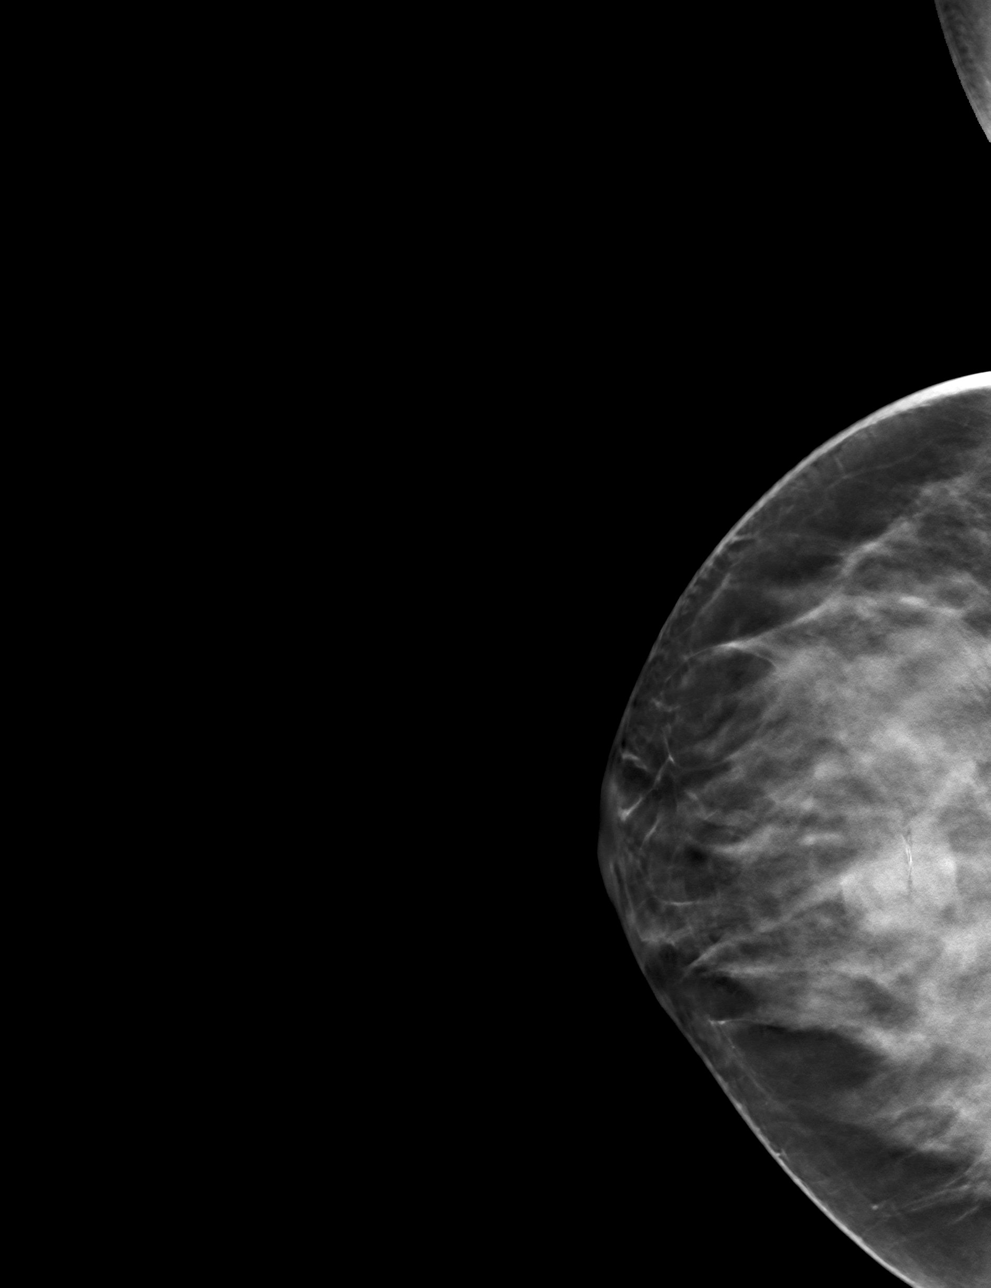

[L ML tomo · tomo slice 41/80.0]
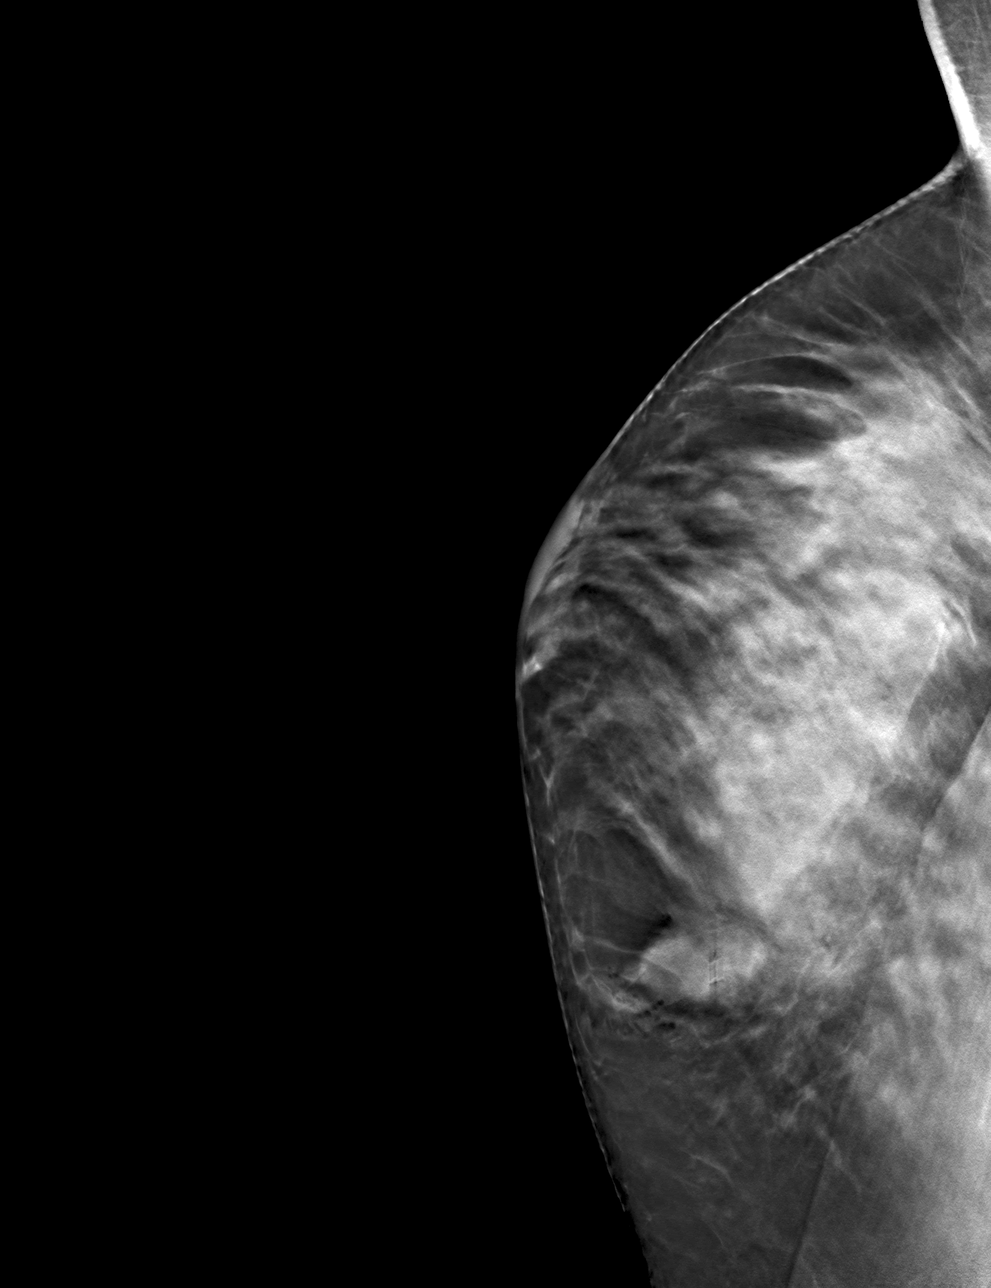

[4 of 12 positions shown; findings below may reference images not displayed]

FINDINGS: 3D Mammographic images were obtained following ultrasound guided
biopsy of a left breast mass. The biopsy marking clip is in expected
position at the site of biopsy.
IMPRESSION: Appropriate positioning of the ribbon shaped biopsy marking clip at
the site of biopsy in the biopsied left breast mass.

Final Assessment: Post Procedure Mammograms for Marker Placement

## 2022-05-06 IMAGING — MG US BREAST BX W LOC DEV 1ST LESION IMG BX SPEC US GUIDE*L*
1 series · 8 of 8 positions shown · non-contrast
Comparison: Previous exam(s).
COMPARISON: Previous exam(s).

Addendum:
CLINICAL DATA: Biopsy of an 11 o'clock left breast mass.

EXAM:
ULTRASOUND GUIDED LEFT BREAST CORE NEEDLE BIOPSY

[Series 1: MG view · 0.07mm/px · 8 of 11 slices shown]
[im 1/11]
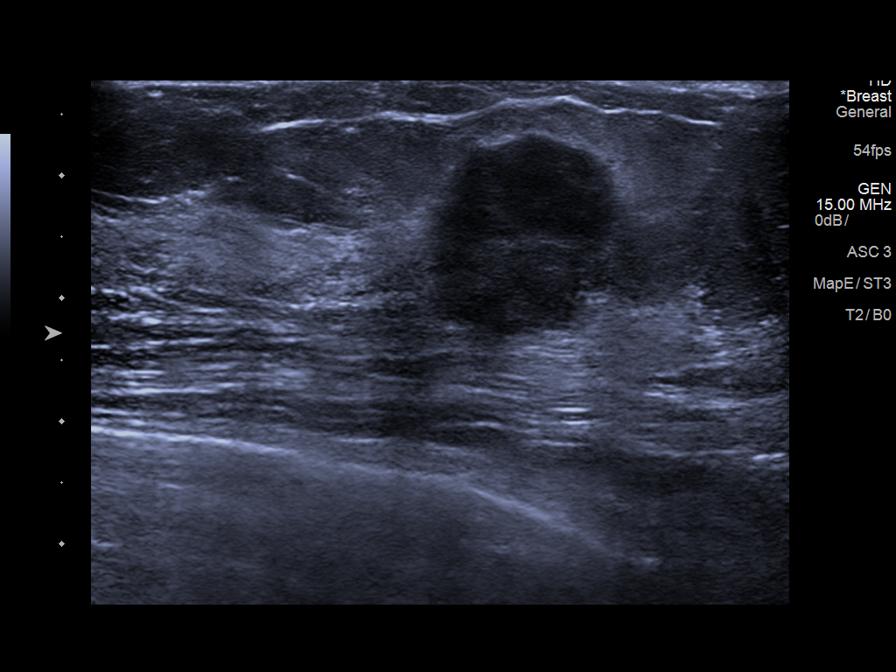
[im 2/11]
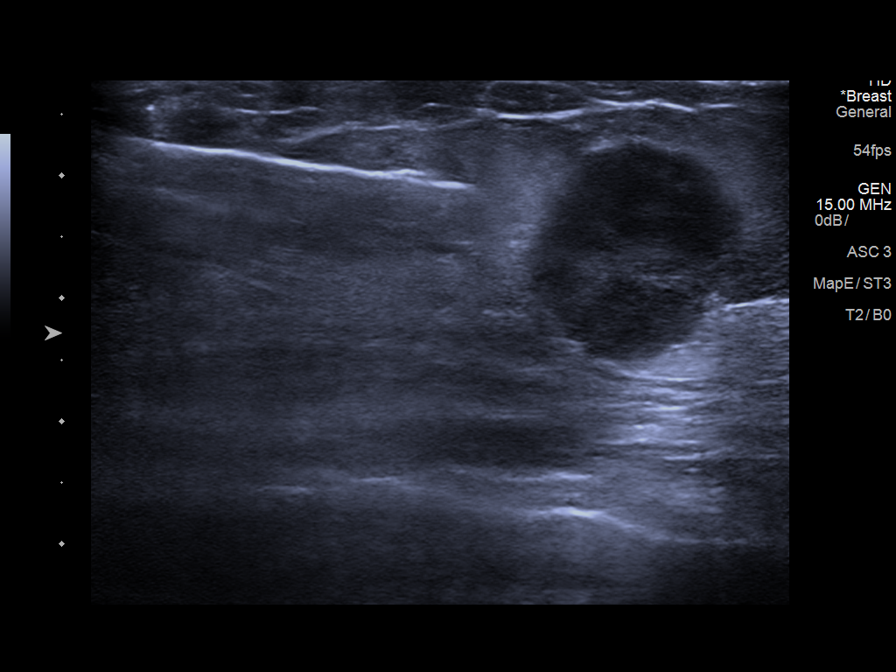
[im 3/11]
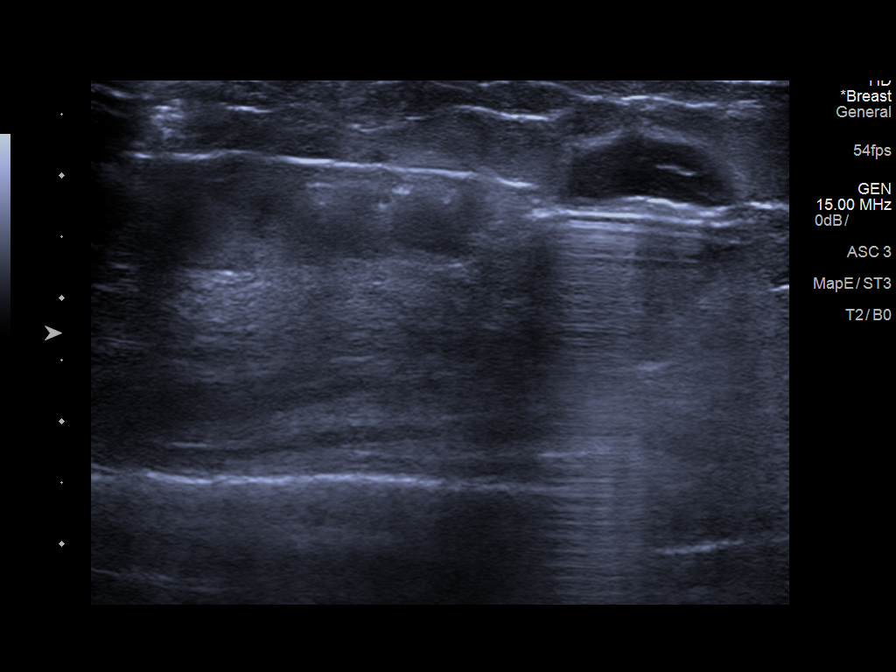
[im 5/11]
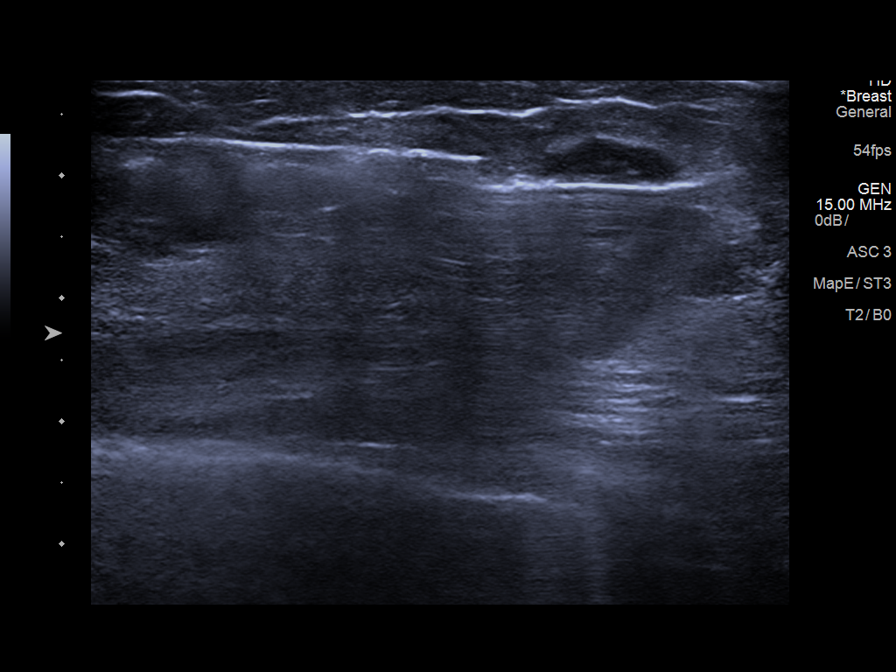
[im 6/11]
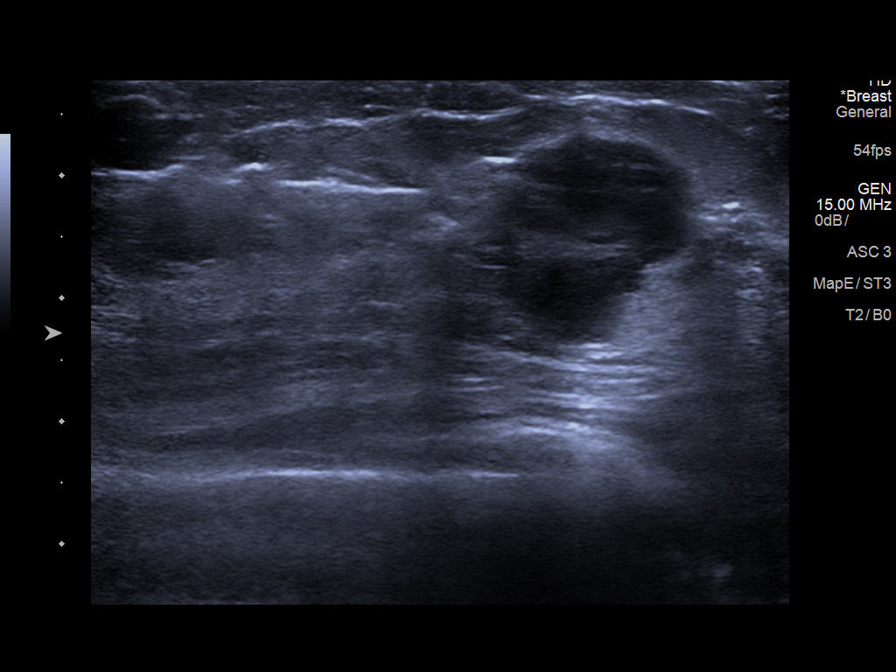
[im 8/11]
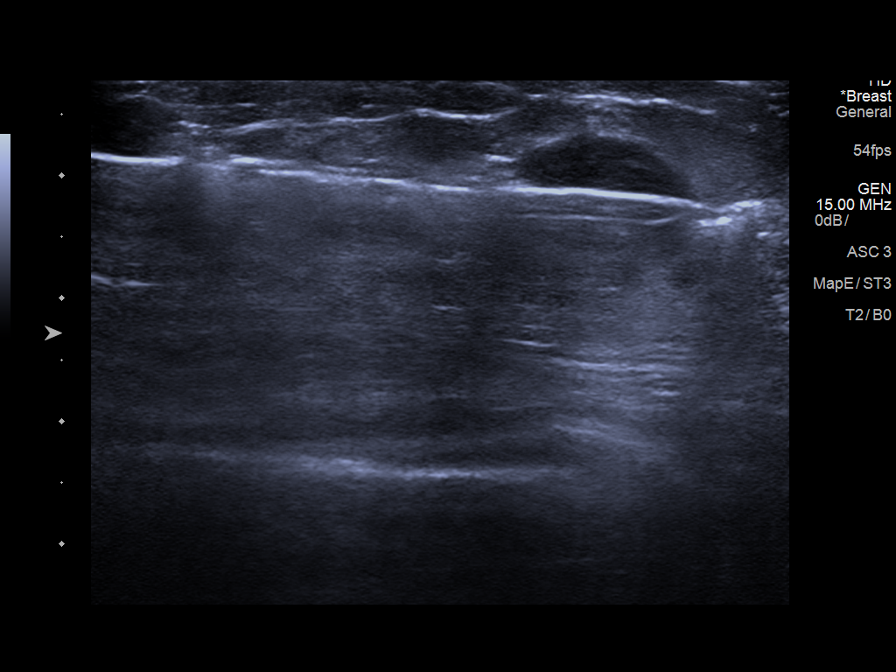
[im 9/11]
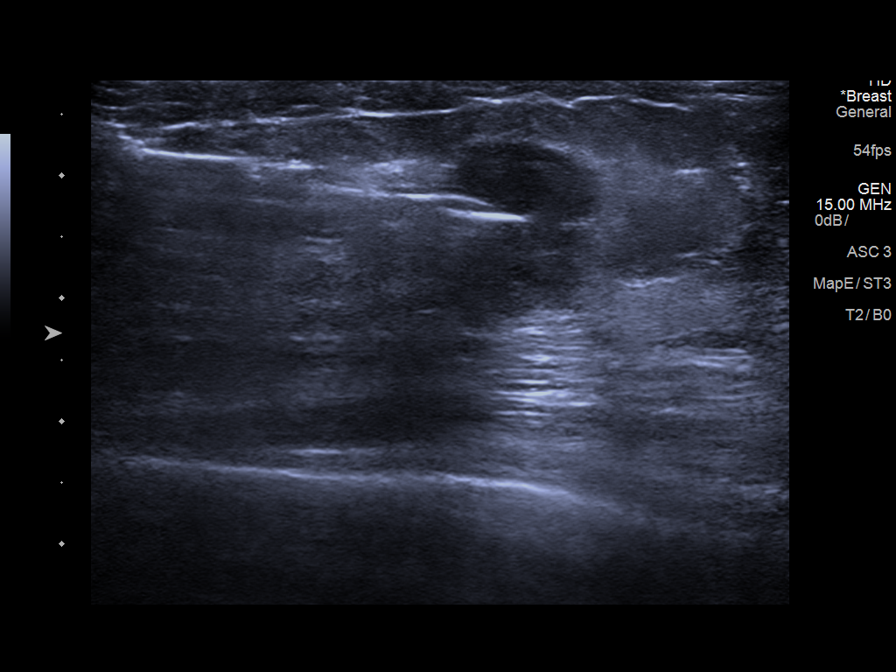
[im 11/11]
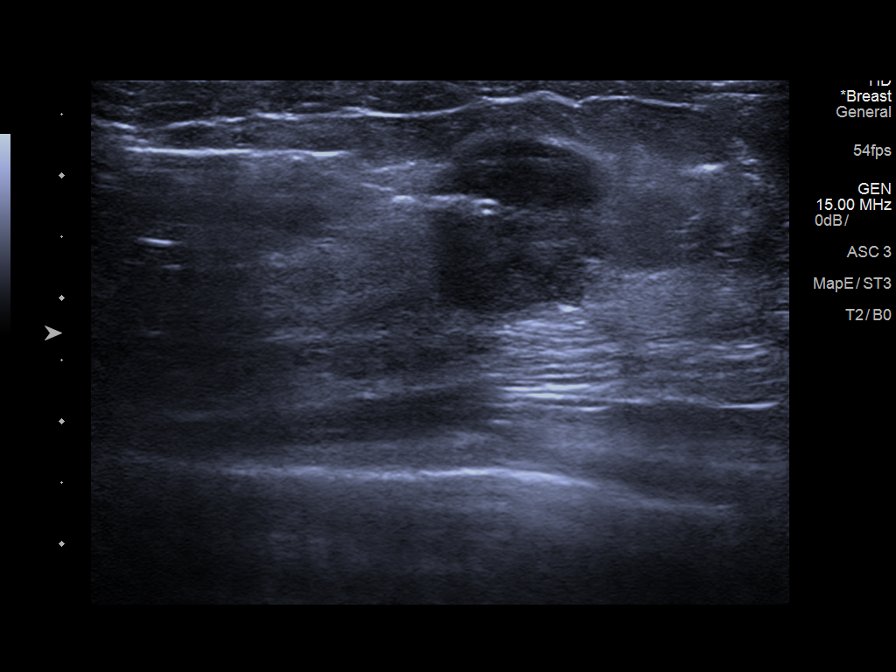

[8 of 8 positions shown; findings below may reference images not displayed]



Lesion quadrant: 11 o'clock left breast

Using sterile technique and 1% Lidocaine as local anesthetic, under
direct ultrasound visualization, a 12 gauge Siciliano device was
used to perform biopsy of the 11 o'clock left breast mass using a
medial approach. At the conclusion of the procedure a ribbon shaped
tissue marker clip was deployed into the biopsy cavity. Follow up 2
view mammogram was performed and dictated separately.
IMPRESSION: Ultrasound guided biopsy of an 11 o'clock left breast mass. No
apparent complications.

ADDENDUM:
PATHOLOGY revealed: A. BREAST, LEFT [DATE] 6 CM FN; ULTRASOUND-GUIDED
BIOPSY: - INVASIVE MAMMARY CARCINOMA, NO SPECIAL TYPE, WITH
OSTEOCLAST-LIKE STROMAL GIANT CELLS. Size of invasive carcinoma: 12
mm in this sample. Grade 3. Ductal carcinoma in situ: Present,
high-grade. Lymphovascular invasion: Focus suspicious for
Lymphovascular invasion.

Pathology results are CONCORDANT with imaging findings, per Dr.
Muzie Barthus.

Pathology results and recommendations were discussed with patient
via telephone on 03/22/2021. Patient reported biopsy site doing well
with no adverse symptoms, and only slight tenderness at the site.
Post biopsy care instructions were reviewed, questions were answered
and my direct phone number was provided. Patient was instructed to
call [HOSPITAL] for any additional questions or concerns
related to biopsy site.

RECOMMENDATIONS: 1. Please schedule patient for ultrasound guided
LEFT axillary lymph node biopsy given prominent to mildly thickened
cortices noted on imaging.

2. Surgical consultation. Request for surgical consultation relayed
to Solomon Vi RN at [HOSPITAL] [HOSPITAL] by Hu Gottschalk
RN on 03/22/2021.

3. Consider bilateral breast MRI given high-grade DCIS and to
evaluate extent of breast disease.

Pathology results reported by Hu Gottschalk RN on 03/22/2021.



Lesion quadrant: 11 o'clock left breast

Using sterile technique and 1% Lidocaine as local anesthetic, under
direct ultrasound visualization, a 12 gauge Siciliano device was
used to perform biopsy of the 11 o'clock left breast mass using a
medial approach. At the conclusion of the procedure a ribbon shaped
tissue marker clip was deployed into the biopsy cavity. Follow up 2
view mammogram was performed and dictated separately.
IMPRESSION: Ultrasound guided biopsy of an 11 o'clock left breast mass. No
apparent complications.

## 2022-05-07 ENCOUNTER — Telehealth: Payer: Self-pay

## 2022-05-07 NOTE — Telephone Encounter (Signed)
-----   Message from Ralene Bathe, MD sent at 05/04/2022  2:06 PM EST ----- Diagnosis 1. Skin , left mid back paraspinal PIGMENTED SEBORRHEIC KERATOSIS 2. Skin , right medial breast DYSPLASTIC COMPOUND NEVUS WITH MILD ATYPIA, PERIPHERAL AND DEEP MARGINS INVOLVED 3. Skin , right mid lateral tricep DYSPLASTIC COMPOUND NEVUS WITH MODERATE ATYPIA, LIMITED MARGINS FREE  1-benign keratosis No further treatment needed. 2-mild dysplastic Check at next visit 3-moderate dysplastic Recheck next visit

## 2022-05-07 NOTE — Telephone Encounter (Signed)
Left voicemail to return my call

## 2022-05-21 ENCOUNTER — Inpatient Hospital Stay: Payer: BC Managed Care – PPO | Attending: Oncology | Admitting: Oncology

## 2022-05-21 ENCOUNTER — Encounter: Payer: Self-pay | Admitting: Oncology

## 2022-05-21 VITALS — BP 131/93 | HR 93 | Temp 99.2°F | Ht 69.69 in | Wt 156.0 lb

## 2022-05-21 DIAGNOSIS — Z5181 Encounter for therapeutic drug level monitoring: Secondary | ICD-10-CM | POA: Diagnosis not present

## 2022-05-21 DIAGNOSIS — Z08 Encounter for follow-up examination after completed treatment for malignant neoplasm: Secondary | ICD-10-CM | POA: Diagnosis not present

## 2022-05-21 DIAGNOSIS — Z853 Personal history of malignant neoplasm of breast: Secondary | ICD-10-CM

## 2022-05-21 DIAGNOSIS — C50212 Malignant neoplasm of upper-inner quadrant of left female breast: Secondary | ICD-10-CM | POA: Insufficient documentation

## 2022-05-21 DIAGNOSIS — Z79810 Long term (current) use of selective estrogen receptor modulators (SERMs): Secondary | ICD-10-CM | POA: Diagnosis not present

## 2022-05-21 DIAGNOSIS — Z170 Estrogen receptor positive status [ER+]: Secondary | ICD-10-CM | POA: Insufficient documentation

## 2022-05-21 NOTE — Progress Notes (Signed)
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  Diagnostic bilateral mammogram in October 2022 showed hypoechoic mass in the 11 o'clock position in the left breast measuring 1.7 x 1.3 x 1.7 cm.  Several left axillary lymph nodes prominent to mildly thickened cortices.  These are likely reactive from recent COVID-vaccine..  Left breast biopsy of the mass showed invasive mammary carcinoma grade 3 ER greater than 90% positive PR greater than 90% positive and HER2 negative.  Patient met with Dr. Bary Castilla who also biopsied one of the concerning lymph nodes in the mammogram which was negative for malignancy.  Final pathology showed a 3.5 cm grade 3 invasive mammary carcinoma with negative margins.12 lymph nodes were negative for malignancy.  Oncotype score came back at 14 and therefore patient did not require any adjuvant chemotherapy.  Patient completed adjuvant radiation therapy.  Both ovarian suppression plus AI versus tamoxifen was discussed with the patient.  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  Family history of breast cancer in paternal grandmother, skin cancers in both her parents but she does not know if that was a melanoma.  She is G0, P0 and has decided against childbearing.  Menarche at the age of 54.  She  is currently premenopausal.  She does not desire any children in the future     Interval history-patient is doing well overall.  She experiences occasional pain in her left breast where she has had surgery in the past.  Appetite and weight have remained stable.  Occasional arthralgias on tamoxifen which are overall self-limited.  ECOG PS- 0 Pain scale- 0  Review of systems- Review of Systems  Constitutional:  Negative for chills, fever, malaise/fatigue and weight loss.  HENT:  Negative for congestion, ear discharge and nosebleeds.   Eyes:  Negative for blurred vision.  Respiratory:  Negative for cough, hemoptysis, sputum production, shortness of breath and wheezing.   Cardiovascular:  Negative for chest pain, palpitations, orthopnea and claudication.  Gastrointestinal:  Negative for abdominal pain, blood in stool, constipation, diarrhea, heartburn, melena, nausea and vomiting.  Genitourinary:  Negative for dysuria, flank pain, frequency, hematuria and urgency.  Musculoskeletal:  Negative for back pain, joint pain and myalgias.  Skin:  Negative for rash.  Neurological:  Negative for dizziness, tingling, focal weakness, seizures, weakness and headaches.  Endo/Heme/Allergies:  Does not bruise/bleed easily.  Psychiatric/Behavioral:  Negative for depression and suicidal ideas. The patient does not have insomnia.       Allergies  Allergen Reactions   Aspirin Shortness Of Breath    Avoids it due to her asthma   Iodine Rash     Past Medical History:  Diagnosis Date   Asthma    Cancer (National)    Dysplastic nevus 04/25/2022   R med breast - mild   Dysplastic nevus 04/25/2022   right mid lateral tricep -  moderate   Family history of breast cancer    History of migraine headaches    Abstract from [place] history of radiation therapy    Uterine myoma      Past Surgical History:  Procedure Laterality Date   ADJACENT TISSUE TRANSFER/TISSUE REARRANGEMENT Left 04/16/2021    Procedure: ADJACENT TISSUE TRANSFER/TISSUE REARRANGEMENT;  Surgeon: Robert Bellow, MD;  Location: [facility] ORS;  Service: General;  Laterality: Left;   BREAST LUMPECTOMY Left 04/16/2021   BREAST LUMPECTOMY WITH SENTINEL LYMPH NODE BIOPSY Left 04/16/2021   Procedure: BREAST LUMPECTOMY WITH SENTINEL LYMPH NODE BX;  Surgeon: Robert Bellow, MD;  Location: [facility] ORS;  Service: General;  Laterality: Left;   NASAL SINUS SURGERY Bilateral 1998   corrected nasal septum in [place]   UTERINE FIBROID SURGERY  2005   in [place],horrible blood loss; not cancerous, "just a growth" some  being watched by OB/GYN.    Social History   Socioeconomic History   Marital status: Single    Spouse name: Not on file   Number of children: Not on file   Years of education: 18   Highest education level: Not on file  Occupational History   Occupation: professor  Tobacco Use   Smoking status: Never   Smokeless tobacco: Never  Vaping Use   Vaping Use: Never used  Substance and Sexual Activity   Alcohol use: No    Comment: less than three mos   Drug use: No   Sexual activity: Not Currently    Birth control/protection: Inserts  Other Topics Concern   Not on file  Social History Narrative   Not on file   Social Determinants of Health   Financial Resource Strain: Not on file  Food Insecurity: Not on file  Transportation Needs: Not on file  Physical Activity: Not on file  Stress: Not on file  Social Connections: Not on file  Intimate Partner Violence: Not on file    Family History  Problem Relation Age of Onset   Endometriosis Mother    Hypertension Mother    Depression Mother    Migraines Mother    Cancer Mother        brain tumors   Skin cancer Father    Hypertension Father    Crohn's disease Father    Asthma Father    Breast cancer Paternal Grandmother    Endometriosis Sister    Depression Sister    Asthma Brother    Hip fracture Paternal Grandfather      Current Outpatient  Medications:    acetaminophen (TYLENOL) 325 MG tablet, Take 650 mg by mouth every 6 (six) hours as needed for moderate pain., Disp: , Rfl:    Acetaminophen-Caffeine (TENSION HEADACHE) 500-65 MG TABS, Take 2 tablets by mouth 2 (two) times daily as needed (HEADACHE)., Disp: , Rfl:    albuterol (PROVENTIL HFA;VENTOLIN HFA) 108 (90 Base) MCG/ACT inhaler, Inhale 2 puffs into the lungs every 6 (six) hours as needed for wheezing or shortness of breath., Disp: , Rfl:    cholecalciferol (VITAMIN D3) 25 MCG (1000 UNIT) tablet, Take 1,000 Units by mouth daily., Disp: , Rfl:    fluticasone (FLONASE) 50 MCG/ACT nasal spray, Place into the nose., Disp: , Rfl:    loratadine (CLARITIN) 10 MG tablet, Take 10 mg by mouth daily., Disp: , Rfl:    magnesium citrate SOLN, Take 1 Bottle by mouth once., Disp: , Rfl:    montelukast (SINGULAIR) 10 MG tablet, Take 1 tablet (10 mg  total) by mouth at bedtime., Disp: 90 tablet, Rfl: 1   Multiple Vitamins-Minerals (MULTIVITAMIN ADULT) CHEW, Chew 2 each by mouth daily., Disp: , Rfl:    Omega-3 Fatty Acids (FISH OIL) 1000 MG CAPS, Take 3,000 mg by mouth daily., Disp: , Rfl:    rizatriptan (MAXALT-MLT) 10 MG disintegrating tablet, Take by mouth., Disp: , Rfl:    sodium chloride (OCEAN) 0.65 % SOLN nasal spray, Place 1 spray into both nostrils as needed for congestion., Disp: 1 Bottle, Rfl: 6   tamoxifen (NOLVADEX) 20 MG tablet, Take 1 tablet (20 mg total) by mouth daily., Disp: 90 tablet, Rfl: 3   celecoxib (CELEBREX) 200 MG capsule, Take by mouth. (Patient not taking: Reported on 02/20/2022), Disp: , Rfl:    celecoxib (CELEBREX) 200 MG capsule, Take by mouth. (Patient not taking: Reported on 02/20/2022), Disp: , Rfl:    methylPREDNISolone (MEDROL DOSEPAK) 4 MG TBPK tablet, Take as directed (Patient not taking: Reported on 04/24/2022), Disp: 1 each, Rfl: 0   Pseudoephedrine HCl (SUDAFED PO), Take 10 mg by mouth. (Patient not taking: Reported on 02/20/2022), Disp: , Rfl:   Physical  exam:  Vitals:   05/21/22 1134  BP: (!) 131/93  Pulse: 93  Temp: 99.2 F (37.3 C)  TempSrc: Tympanic  Weight: 156 lb (70.8 kg)  Height: 5' 9.69" (1.77 m)   Physical Exam Cardiovascular:     Rate and Rhythm: Normal rate and regular rhythm.     Heart sounds: Normal heart sounds.  Pulmonary:     Effort: Pulmonary effort is normal.  Skin:    General: Skin is warm and dry.  Neurological:     Mental Status: She is alert and oriented to person, place, and time.   Breast exam was performed in seated and lying down position. Patient is status post left lumpectomy with a well-healed surgical scar. No evidence of any palpable masses. No evidence of axillary adenopathy. No evidence of any palpable masses or lumps in the right breast. No evidence of right axillary adenopathy      Latest Ref Rng & Units 02/20/2022    7:49 AM  CMP  Glucose 70 - 99 mg/dL 97   BUN 6 - 24 mg/dL 16   Creatinine 0.57 - 1.00 mg/dL 0.90   Sodium 134 - 144 mmol/L 145   Potassium 3.5 - 5.2 mmol/L 4.5   Chloride 96 - 106 mmol/L 108   Calcium 8.7 - 10.2 mg/dL 9.3   Total Protein 6.0 - 8.5 g/dL 6.9   Total Bilirubin 0.0 - 1.2 mg/dL 0.2   Alkaline Phos 44 - 121 IU/L 107   AST 0 - 40 IU/L 16   ALT 0 - 32 IU/L 15       Latest Ref Rng & Units 02/20/2022    7:49 AM  CBC  WBC 3.4 - 10.8 x10E3/uL 5.2   Hemoglobin 11.1 - 15.9 g/dL 12.9   Hematocrit 34.0 - 46.6 % 38.9   Platelets 150 - 450 x10E3/uL 253      Assessment and plan- Patient is a 42 y.o. female with pathological prognostic stage Ib invasive mammary carcinoma of the left breast ER/PR positive HER2 negative s/p lumpectomy and adjuvant radiation treatment currently on tamoxifen.  This is a routine follow-up visit  Clinically patient is doing well with no concerning signsAnd symptoms of recurrence based on today's exam.  Her mammogram from August 2023 was unremarkable.  However she does have extremely dense breast tissue category D which lowers the  sensitivity of her mammogram.  I therefore would like to alternate mammograms with MRIs every 6 months.  Will plan to get the MRI scheduled in February 2024.  I will see her back in 3 to 4 months no labs.  We discussed ovarian suppression plus AI versus tamoxifen.  Patient would like to continue with tamoxifen at this time.  She still gets her menstrual cycles.   Visit Diagnosis 1. Encounter for follow-up surveillance of breast cancer   2. Encounter for monitoring tamoxifen therapy      Dr. Randa Evens, MD, MPH [facility] [phone] 05/21/2022 2:55 PM

## 2022-06-01 IMAGING — MG MM BREAST SURGICAL SPECIMEN
1 series · 1 of 1 positions shown · non-contrast
Comparison: Previous exam(s).

CLINICAL DATA: Left breast lumpectomy for breast cancer.

EXAM:
SPECIMEN RADIOGRAPH OF THE LEFT BREAST

[L]
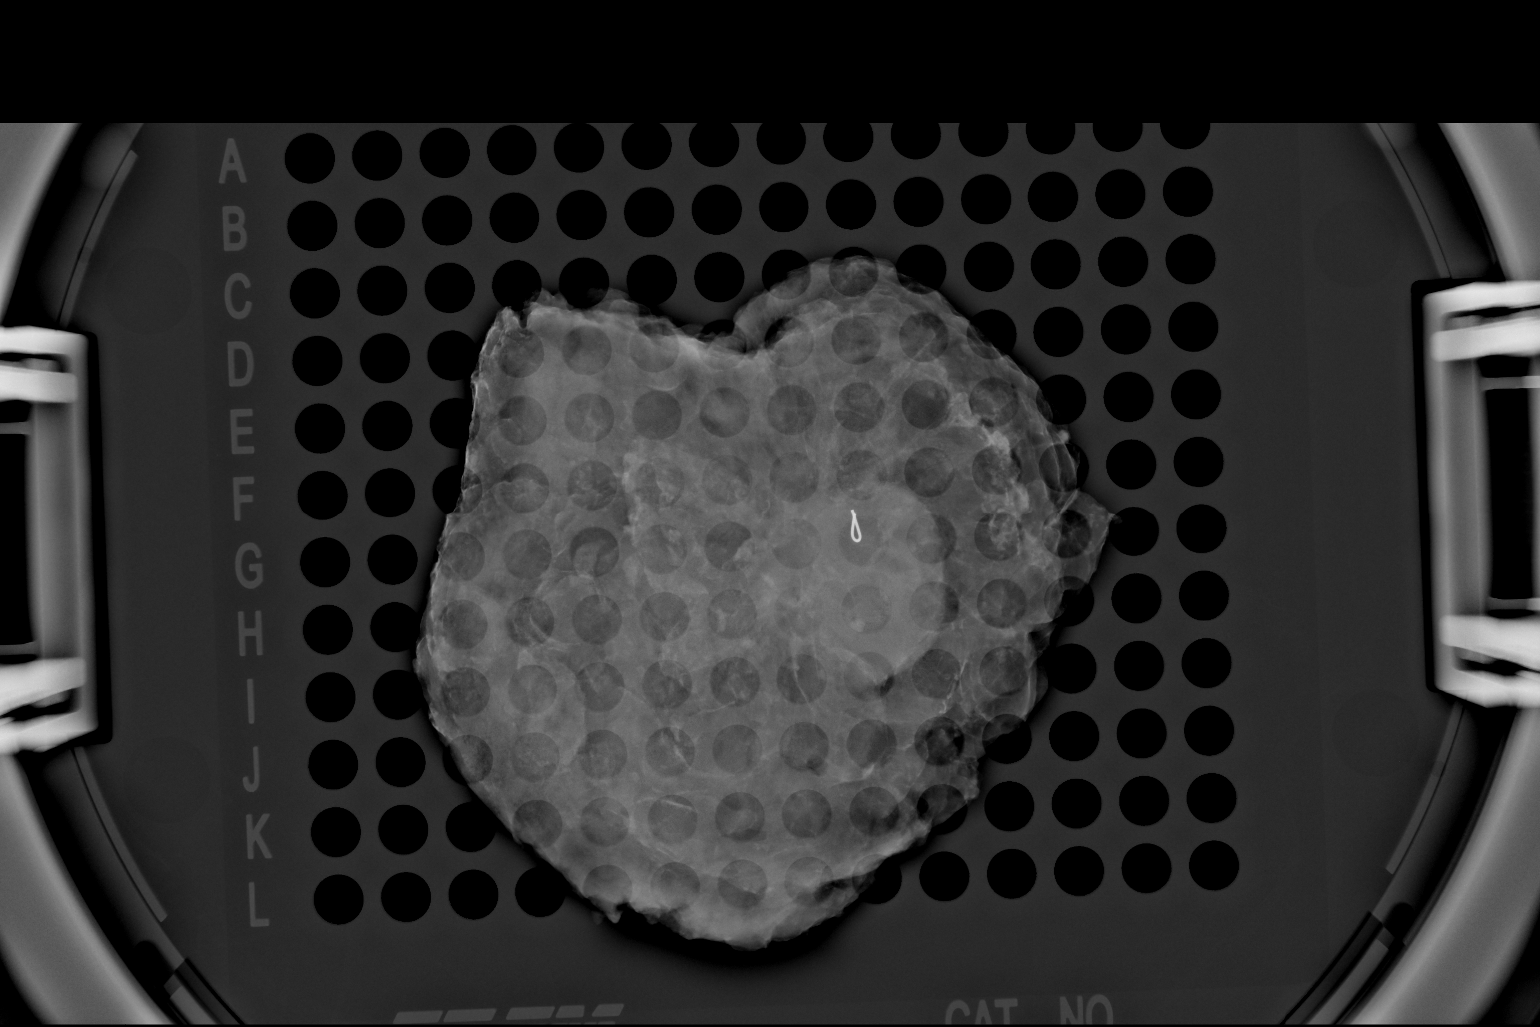

[1 of 1 positions shown; findings below may reference images not displayed]

FINDINGS: Status post excision of the left breast. The ribbon shaped marker
clip is present within the specimen.
IMPRESSION: Specimen radiograph of the left breast.

## 2022-06-13 ENCOUNTER — Telehealth: Payer: Self-pay

## 2022-06-13 NOTE — Telephone Encounter (Signed)
-----  Message from Ralene Bathe, MD sent at 05/04/2022  2:06 PM EST ----- Diagnosis 1. Skin , left mid back paraspinal PIGMENTED SEBORRHEIC KERATOSIS 2. Skin , right medial breast DYSPLASTIC COMPOUND NEVUS WITH MILD ATYPIA, PERIPHERAL AND DEEP MARGINS INVOLVED 3. Skin , right mid lateral tricep DYSPLASTIC COMPOUND NEVUS WITH MODERATE ATYPIA, LIMITED MARGINS FREE  1-benign keratosis No further treatment needed. 2-mild dysplastic Check at next visit 3-moderate dysplastic Recheck next visit

## 2022-06-13 NOTE — Telephone Encounter (Signed)
Patient advised of BX results .aw 

## 2022-06-29 ENCOUNTER — Other Ambulatory Visit: Payer: Self-pay | Admitting: Oncology

## 2022-07-08 ENCOUNTER — Ambulatory Visit: Payer: BC Managed Care – PPO | Admitting: Radiation Oncology

## 2022-07-12 ENCOUNTER — Ambulatory Visit
Admission: RE | Admit: 2022-07-12 | Discharge: 2022-07-12 | Disposition: A | Discharge status: Home / Self Care | Payer: BC Managed Care – PPO | Source: Ambulatory Visit | Attending: Oncology | Admitting: Oncology

## 2022-07-12 ENCOUNTER — Encounter: Payer: Self-pay | Admitting: Radiology

## 2022-07-12 ENCOUNTER — Telehealth: Payer: Self-pay | Admitting: *Deleted

## 2022-07-12 DIAGNOSIS — Z853 Personal history of malignant neoplasm of breast: Secondary | ICD-10-CM | POA: Diagnosis present

## 2022-07-12 DIAGNOSIS — Z08 Encounter for follow-up examination after completed treatment for malignant neoplasm: Secondary | ICD-10-CM | POA: Insufficient documentation

## 2022-07-12 MED ORDER — GADOBUTROL 1 MMOL/ML IV SOLN
7.0000 mL | Freq: Once | INTRAVENOUS | Status: AC | PRN
  Administered 2022-07-12: 7 mL via INTRAVENOUS

## 2022-07-12 NOTE — Telephone Encounter (Signed)
Pt called and left message that pt had MRI today and wanted to see  how she gets results. I spoke to Dr. Janese Banks and mri was good, no issues. I called the pt. Back and let her know that MRI was good. Dr/ Janese Banks wants to keep her already schedule that she has in May. Also Dr. Janese Banks said that she will get a mammogram 6 months and rotate with MRI. This means that every 6 months you will get mammogram and 6 months later get MRI. She understands and happy about good news from MRI

## 2022-07-15 ENCOUNTER — Ambulatory Visit
Admission: RE | Admit: 2022-07-15 | Discharge: 2022-07-15 | Disposition: A | Discharge status: Home / Self Care | Payer: BC Managed Care – PPO | Source: Ambulatory Visit | Attending: Radiation Oncology | Admitting: Radiation Oncology

## 2022-07-15 ENCOUNTER — Encounter: Payer: Self-pay | Admitting: Radiation Oncology

## 2022-07-15 VITALS — BP 132/88 | HR 93 | Temp 97.5°F | Resp 16 | Ht 69.0 in | Wt 157.6 lb

## 2022-07-15 DIAGNOSIS — Z79810 Long term (current) use of selective estrogen receptor modulators (SERMs): Secondary | ICD-10-CM | POA: Insufficient documentation

## 2022-07-15 DIAGNOSIS — Z170 Estrogen receptor positive status [ER+]: Secondary | ICD-10-CM | POA: Diagnosis not present

## 2022-07-15 DIAGNOSIS — Z923 Personal history of irradiation: Secondary | ICD-10-CM | POA: Insufficient documentation

## 2022-07-15 DIAGNOSIS — C50212 Malignant neoplasm of upper-inner quadrant of left female breast: Secondary | ICD-10-CM | POA: Insufficient documentation

## 2022-07-15 NOTE — Progress Notes (Signed)
Radiation Oncology Follow up Note  Name: Madison Sullivan   Date:   07/15/2022 MRN:  [id number] DOB: June 15, 1980    This 42 y.o. 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  REFERRING PROVIDER: Marcy Salvo*  HPI: Patient is a 42 year old female now out whole year having completed whole breast radiation to her left breast for stage IIa ER positive invasive mammary carcinoma seen today in routine follow-up she is doing well.  She specifically denies breast tenderness cough or bone pain.  She had a recent MRI scan.  Which I have reviewed was BI-RADS Category 1 negative.  Her previous mammogram showed a right breast 2.3 cm complicated cyst.  She is currently onTamoxifen she has been having some problems with ovarian cysts which is being followed by her gynecologist.  Marena Chancy if this related to tamoxifen although she is going to address that with Dr. Janese Banks.  COMPLICATIONS OF TREATMENT: none  FOLLOW UP COMPLIANCE: keeps appointments   PHYSICAL EXAM:  BP 132/88 (BP Location: Right Arm, Patient Position: Sitting)   Pulse 93   Temp (!) 97.5 F (36.4 C) (Tympanic)   Resp 16   Ht '5\' 9"'$  (1.753 m)   Wt 157 lb 9.6 oz (71.5 kg)   BMI 23.27 kg/m  Lungs are clear to A&P cardiac examination essentially unremarkable with regular rate and rhythm. No dominant mass or nodularity is noted in either breast in 2 positions examined. Incision is well-healed. No axillary or supraclavicular adenopathy is appreciated. Cosmetic result is excellent.  Well-developed well-nourished patient in NAD. HEENT reveals PERLA, EOMI, discs not visualized.  Oral cavity is clear. No oral mucosal lesions are identified. Neck is clear without evidence of cervical or supraclavicular adenopathy. Lungs are clear to A&P. Cardiac examination is essentially unremarkable with regular rate and rhythm without  murmur rub or thrill. Abdomen is benign with no organomegaly or masses noted. Motor sensory and DTR levels are equal and symmetric in the upper and lower extremities. Cranial nerves II through XII are grossly intact. Proprioception is intact. No peripheral adenopathy or edema is identified. No motor or sensory levels are noted. Crude visual fields are within normal range.  RADIOLOGY RESULTS: Mammogram and MRI scans reviewed compatible with above-stated findings  PLAN: Present time patient is now 1 year out with no evidence of disease she is doing well.  She continues close follow-up for her ovarian cysts.  I have also asked her to discuss tamoxifen therapy whether that can be related to these ovarian issues.  Otherwise on pleased with her overall progress have asked to see her back in 1 year for follow-up.  Patient is to call with any concerns.  I would like to take this opportunity to thank you for allowing me to participate in the care of your patient.Noreene Filbert, MD

## 2022-07-28 IMAGING — US US PELVIS COMPLETE WITH TRANSVAGINAL
1 series · 15 of 25 positions shown · non-contrast
Comparison: 02/08/2020

CLINICAL DATA: Uterine fibroids, LMP 05/29/2021

EXAM:
TRANSABDOMINAL AND TRANSVAGINAL ULTRASOUND OF PELVIS
TECHNIQUE: Both transabdominal and transvaginal ultrasound examinations of the
pelvis were performed. Transabdominal technique was performed for
global imaging of the pelvis including uterus, ovaries, adnexal
regions, and pelvic cul-de-sac. It was necessary to proceed with
endovaginal exam following the transabdominal exam to visualize the
uterus, endometrium and ovaries.

[Series 1: us pelvis complete · 15 of 54 slices shown]
[im 1/54]
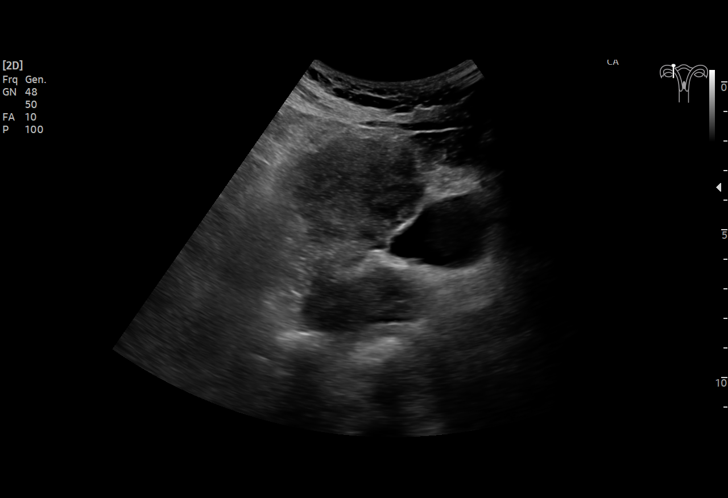
[im 5/54]
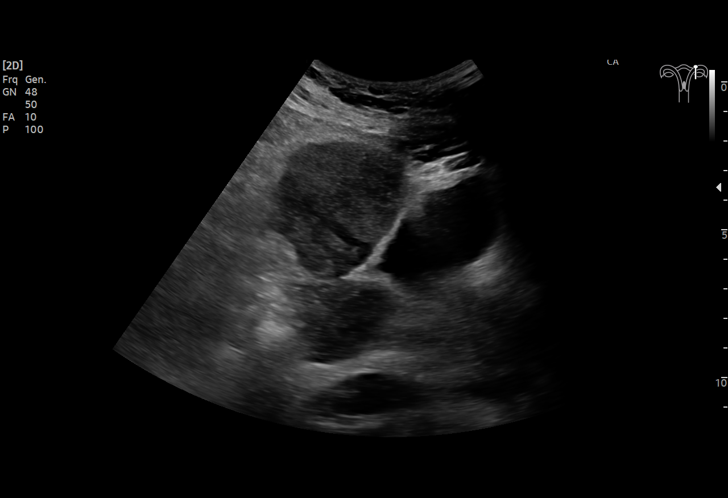
[im 9/54]
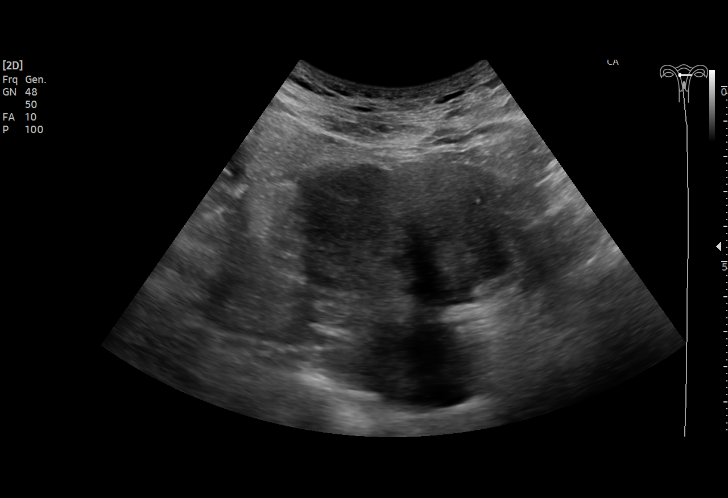
[im 12/54]
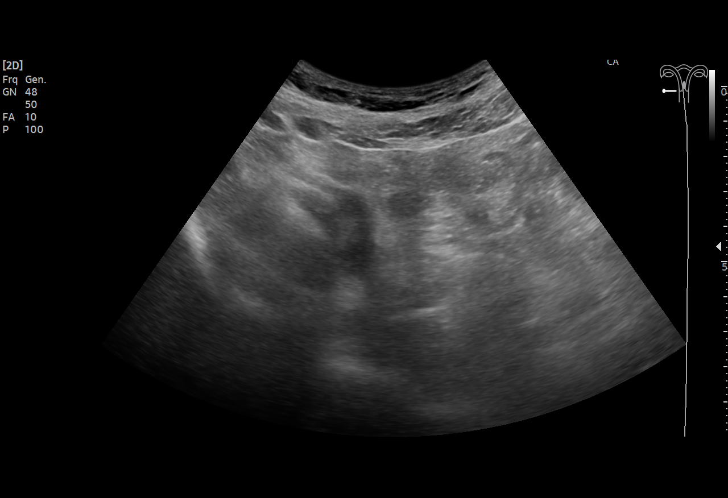
[im 16/54]
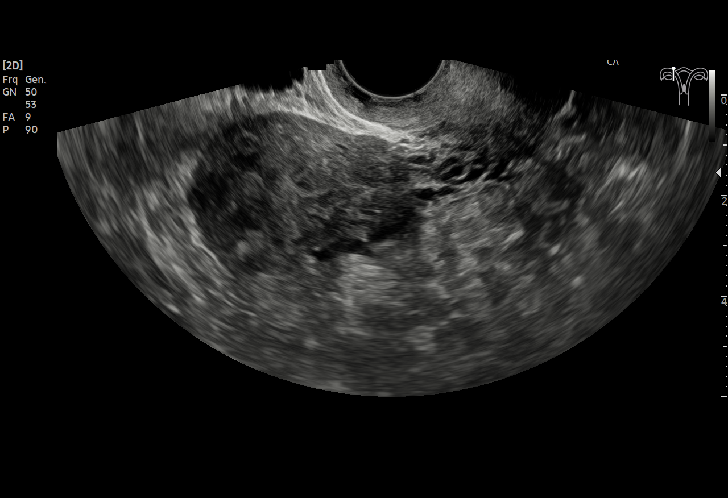
[im 20/54]
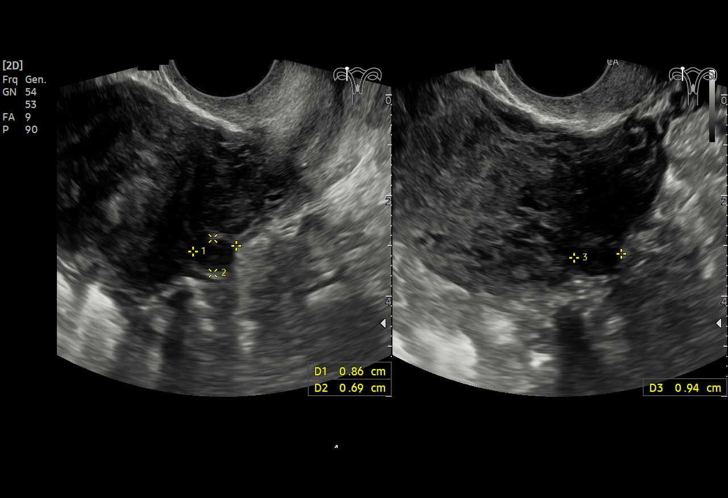
[im 23/54]
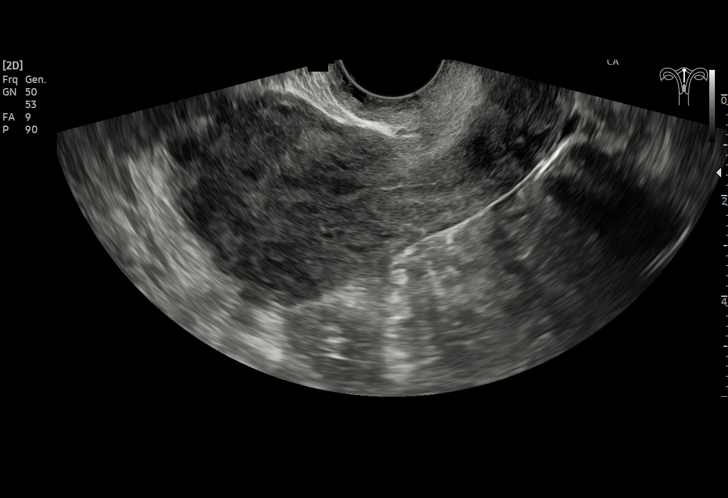
[im 27/54]
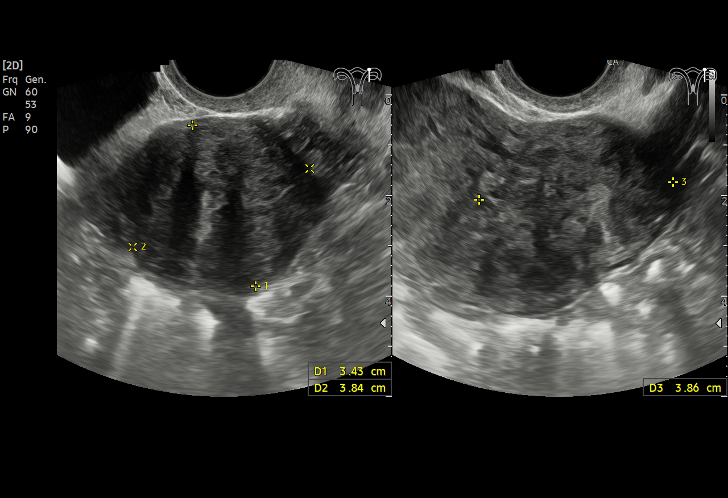
[im 31/54]
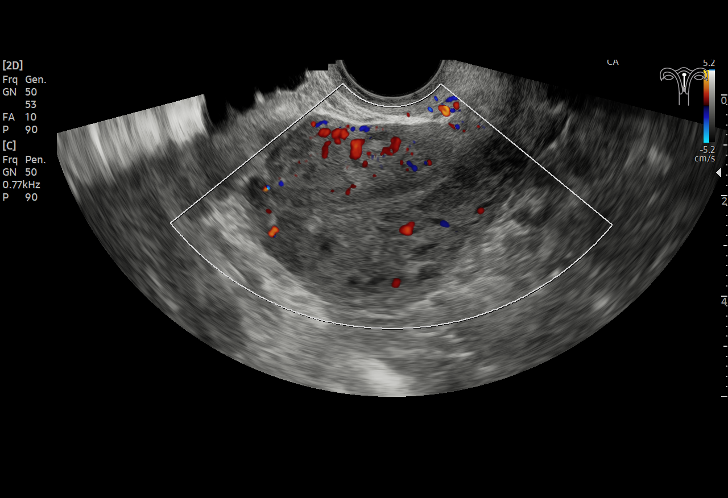
[im 34/54]
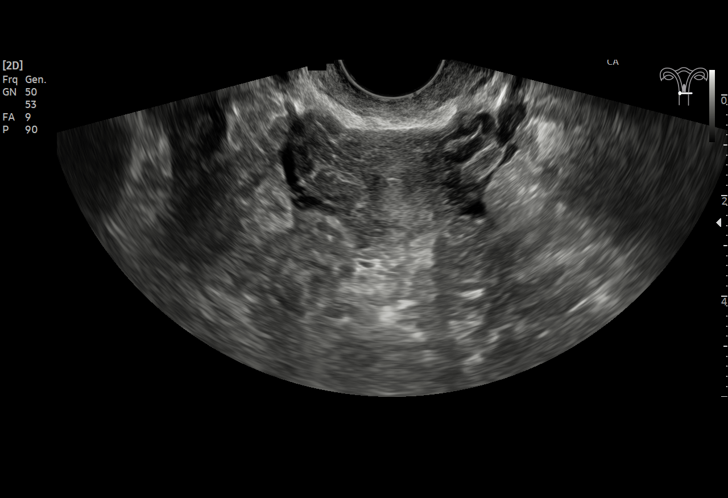
[im 38/54]
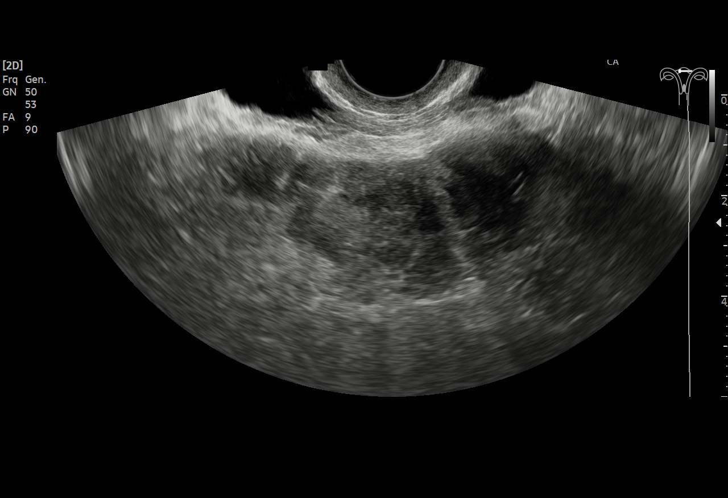
[im 42/54]
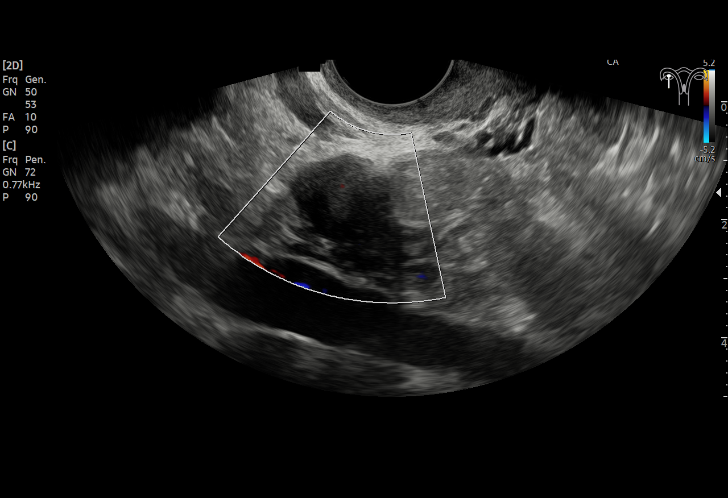
[im 45/54]
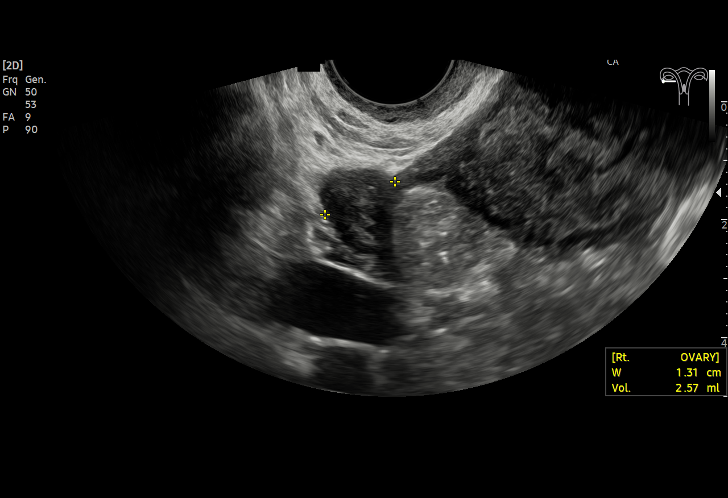
[im 49/54]
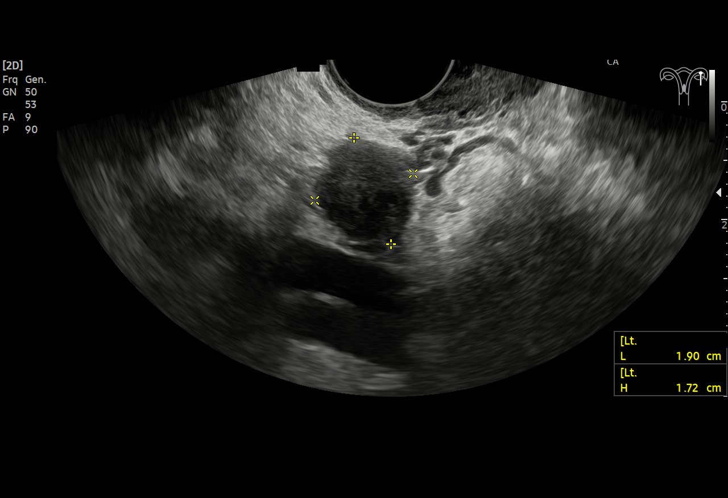
[im 54/54]
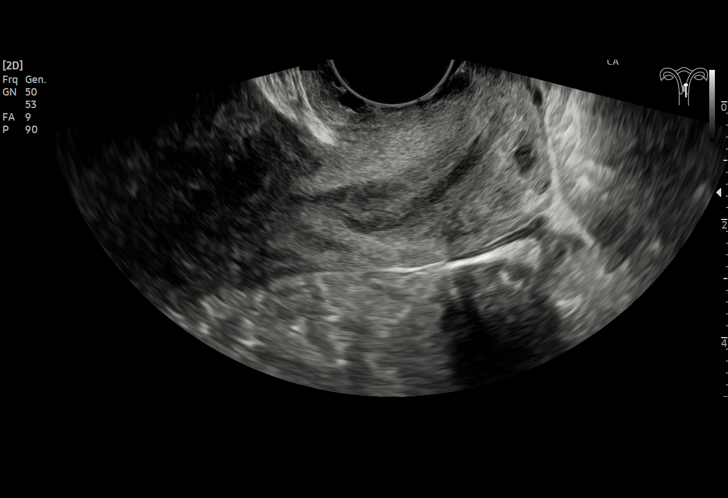

[15 of 25 positions shown; findings below may reference images not displayed]

FINDINGS: Uterus

Measurements: 8.7 x 4.0 x 6.5 cm = volume: 118 mL. Anteverted.
Heterogeneous myometrium. Multiple small nodular foci consistent
with leiomyomata. Scattered shadowing. Measured leiomyomata include
LEFT lateral leiomyoma, likely transmural, 3.9 cm greatest size.
Additional measured leiomyomata measure 16 mm largest diameter.

Endometrium

Thickness: 3 mm.  No endometrial fluid or mass

Right ovary

Measurements: 2.2 x 1.7 x 1.3 cm = volume: 2.6 mL. Normal morphology
without mass

Left ovary

Measurements: 1.9 x 1.7 x 1.7 cm = volume: 2.9 mL. Normal morphology
without mass

Other findings

No free pelvic fluid.  No adnexal masses.
IMPRESSION: Multiple small uterine leiomyomata, largest 3.9 cm diameter
transmural.

Remainder of exam unremarkable.

## 2022-08-08 ENCOUNTER — Encounter: Payer: Self-pay | Admitting: Oncology

## 2022-08-12 ENCOUNTER — Ambulatory Visit (INDEPENDENT_AMBULATORY_CARE_PROVIDER_SITE_OTHER): Payer: Self-pay | Admitting: Physician Assistant

## 2022-08-12 VITALS — BP 112/70 | HR 88 | Temp 97.8°F | Resp 16

## 2022-08-12 DIAGNOSIS — H00014 Hordeolum externum left upper eyelid: Secondary | ICD-10-CM

## 2022-08-12 MED ORDER — ERYTHROMYCIN 5 MG/GM OP OINT: 1.0000 | TOPICAL_OINTMENT | Freq: Three times a day (TID) | OPHTHALMIC | 0 refills | Status: AC

## 2022-08-12 NOTE — Progress Notes (Signed)
[facility] [address], [place] [address]   Office Visit Note  Patient Name: Madison Sullivan Date of Birth [id number]  Medical Record number [id number]  Date of Service: 08/12/2022  Chief Complaint  Patient presents with   Eye Problem     42 y/o F presents to the clinic for c/o left eye swelling and slight itching. Pt denies photophobia/photosensitivity, redness, excessive tearing. Denies any FB sensation. +swelling of the left eye lid x 2 days. No other cold symptoms. Denies discharge.   Eye Problem  Associated symptoms include itching. Pertinent negatives include no eye discharge, eye redness or photophobia.      Current Medication:  Outpatient Encounter Medications as of 08/12/2022  Medication Sig   erythromycin ophthalmic ointment Place 1 Application into the left eye 3 (three) times daily for 5 days.   acetaminophen (TYLENOL) 325 MG tablet Take 650 mg by mouth every 6 (six) hours as needed for moderate pain.   Acetaminophen-Caffeine (TENSION HEADACHE) 500-65 MG TABS Take 2 tablets by mouth 2 (two) times daily as needed (HEADACHE).   albuterol (PROVENTIL HFA;VENTOLIN HFA) 108 (90 Base) MCG/ACT inhaler Inhale 2 puffs into the lungs every 6 (six) hours as needed for wheezing or shortness of breath.   baclofen (LIORESAL) 10 MG tablet Take 10 mg by mouth as needed for muscle spasms.   cholecalciferol (VITAMIN D3) 25 MCG (1000 UNIT) tablet Take 1,000 Units by mouth daily.   fluticasone (FLONASE) 50 MCG/ACT nasal spray Place into the nose.   loratadine (CLARITIN) 10 MG tablet Take 10 mg by mouth daily.   magnesium citrate SOLN Take 1 Bottle by mouth once.   montelukast (SINGULAIR) 10 MG tablet Take 1 tablet (10 mg total) by mouth at bedtime.   Multiple Vitamins-Minerals (MULTIVITAMIN ADULT) CHEW Chew 2 each by mouth daily.   Omega-3 Fatty Acids (FISH OIL) 1000 MG CAPS Take 3,000 mg by mouth daily.   rizatriptan (MAXALT-MLT) 10 MG disintegrating tablet Take by mouth.    sodium chloride (OCEAN) 0.65 % SOLN nasal spray Place 1 spray into both nostrils as needed for congestion.   tamoxifen (NOLVADEX) 20 MG tablet TAKE 1 TABLET(20 MG) BY MOUTH DAILY   [DISCONTINUED] celecoxib (CELEBREX) 200 MG capsule Take by mouth. (Patient not taking: Reported on 02/20/2022)   [DISCONTINUED] celecoxib (CELEBREX) 200 MG capsule Take by mouth. (Patient not taking: Reported on 02/20/2022)   [DISCONTINUED] Pseudoephedrine HCl (SUDAFED PO) Take 10 mg by mouth. (Patient not taking: Reported on 02/20/2022)   No facility-administered encounter medications on file as of 08/12/2022.      Medical History: Past Medical History:  Diagnosis Date   Asthma    Cancer ([place])    Dysplastic nevus 04/25/2022   R med breast - mild   Dysplastic nevus 04/25/2022   right mid lateral tricep - moderate   Family history of breast cancer    History of migraine headaches    Abstract from medicat   Personal history of radiation therapy    Uterine myoma      Vital Signs: BP 112/70 (BP Location: Right Arm, Patient Position: Sitting, Cuff Size: Normal)   Pulse 88   Temp 97.8 F (36.6 C) (Tympanic)   Resp 16   SpO2 98%    Review of Systems  Constitutional: Negative.   HENT: Negative.    Eyes:  Positive for itching. Negative for photophobia, pain, discharge, redness and visual disturbance.  Respiratory: Negative.    Cardiovascular: Negative.   Neurological: Negative.  Physical Exam Constitutional:      Appearance: Normal appearance.  HENT:     Head: Atraumatic.     Right Ear: Tympanic membrane, ear canal and external ear normal.     Left Ear: Tympanic membrane, ear canal and external ear normal.     Nose: Nose normal.     Mouth/Throat:     Mouth: Mucous membranes are moist.     Pharynx: Oropharynx is clear.  Eyes:     General:        Right eye: No discharge.        Left eye: Hordeolum present.No discharge.     Extraocular Movements: Extraocular movements intact.     Right  eye: Normal extraocular motion and no nystagmus.     Left eye: Normal extraocular motion and no nystagmus.     Conjunctiva/sclera:     Right eye: Right conjunctiva is not injected. No exudate.    Left eye: Left conjunctiva is not injected. No exudate.  Cardiovascular:     Rate and Rhythm: Normal rate and regular rhythm.  Pulmonary:     Effort: Pulmonary effort is normal.     Breath sounds: Normal breath sounds.  Musculoskeletal:     Cervical back: Neck supple.  Skin:    General: Skin is warm.  Neurological:     Mental Status: She is alert.  Psychiatric:        Mood and Affect: Mood normal.        Behavior: Behavior normal.        Thought Content: Thought content normal.        Judgment: Judgment normal.       Assessment/Plan:  1. Hordeolum externum of left upper eyelid - erythromycin ophthalmic ointment; Place 1 Application into the left eye 3 (three) times daily for 5 days.  Dispense: 3.5 g; Refill: 0 Apply warm moist compresses to the affected left eye. Add Allegra at instructed. May continue with Claritin as previously instructed. Continue with Flonase nasal spray.  Watch for worsening symptoms.  If symptoms don't improve then consider using antibiotic eye ointment as prescribed. RTC prn Pt verbalized understanding and in agreement.    General Counseling: Madison Sullivan verbalizes understanding of the findings of todays visit and agrees with plan of treatment. I have discussed any further diagnostic evaluation that may be needed or ordered today. We also reviewed her medications today. she has been encouraged to call the office with any questions or concerns that should arise related to todays visit.    Time spent:[address] [place], [place] Physician Assistant

## 2022-08-21 ENCOUNTER — Ambulatory Visit (INDEPENDENT_AMBULATORY_CARE_PROVIDER_SITE_OTHER): Payer: BC Managed Care – PPO | Admitting: Dermatology

## 2022-08-21 ENCOUNTER — Encounter: Payer: Self-pay | Admitting: Dermatology

## 2022-08-21 VITALS — BP 122/79 | HR 102

## 2022-08-21 DIAGNOSIS — L578 Other skin changes due to chronic exposure to nonionizing radiation: Secondary | ICD-10-CM

## 2022-08-21 DIAGNOSIS — Z808 Family history of malignant neoplasm of other organs or systems: Secondary | ICD-10-CM

## 2022-08-21 DIAGNOSIS — D2271 Melanocytic nevi of right lower limb, including hip: Secondary | ICD-10-CM | POA: Diagnosis not present

## 2022-08-21 DIAGNOSIS — D229 Melanocytic nevi, unspecified: Secondary | ICD-10-CM | POA: Diagnosis not present

## 2022-08-21 DIAGNOSIS — D489 Neoplasm of uncertain behavior, unspecified: Secondary | ICD-10-CM

## 2022-08-21 DIAGNOSIS — R229 Localized swelling, mass and lump, unspecified: Secondary | ICD-10-CM

## 2022-08-21 DIAGNOSIS — Z86018 Personal history of other benign neoplasm: Secondary | ICD-10-CM

## 2022-08-21 NOTE — Patient Instructions (Addendum)
Biopsy Wound Care Instructions  Leave the original bandage on for 24 hours if possible.  If the bandage becomes soaked or soiled before that time, it is OK to remove it and examine the wound.  A small amount of post-operative bleeding is normal.  If excessive bleeding occurs, remove the bandage, place gauze over the site and apply continuous pressure (no peeking) over the area for 30 minutes. If this does not work, please call our clinic as soon as possible or page your doctor if it is after hours.   Once a day, cleanse the wound with soap and water. It is fine to shower. If a thick crust develops you may use a Q-tip dipped into dilute hydrogen peroxide (mix 1:1 with water) to dissolve it.  Hydrogen peroxide can slow the healing process, so use it only as needed.    After washing, apply petroleum jelly (Vaseline) or an antibiotic ointment if your doctor prescribed one for you, followed by a bandage.    For best healing, the wound should be covered with a layer of ointment at all times. If you are not able to keep the area covered with a bandage to hold the ointment in place, this may mean re-applying the ointment several times a day.  Continue this wound care until the wound has healed and is no longer open.   Itching and mild discomfort is normal during the healing process. However, if you develop pain or severe itching, please call our office.   If you have any discomfort, you can take Tylenol (acetaminophen) or ibuprofen as directed on the bottle. (Please do not take these if you have an allergy to them or cannot take them for another reason).  Some redness, tenderness and white or yellow material in the wound is normal healing.  If the area becomes very sore and red, or develops a thick yellow-green material (pus), it may be infected; please notify us.    If you have stitches, return to clinic as directed to have the stitches removed. You will continue wound care for 2-3 days after the stitches  are removed.   Wound healing continues for up to one year following surgery. It is not unusual to experience pain in the scar from time to time during the interval.  If the pain becomes severe or the scar thickens, you should notify the office.    A slight amount of redness in a scar is expected for the first six months.  After six months, the redness will fade and the scar will soften and fade.  The color difference becomes less noticeable with time.  If there are any problems, return for a post-op surgery check at your earliest convenience.  To improve the appearance of the scar, you can use silicone scar gel, cream, or sheets (such as Mederma or Serica) every night for up to one year. These are available over the counter (without a prescription).  Please call our office at [phone] for any questions or concerns.       Melanoma ABCDEs  Melanoma is the most dangerous type of skin cancer, and is the leading cause of death from skin disease.  You are more likely to develop melanoma if you: Have light-colored skin, light-colored eyes, or red or blond hair Spend a lot of time in the sun Tan regularly, either outdoors or in a tanning bed Have had blistering sunburns, especially during childhood Have a close family member who has had a melanoma Have atypical moles   or large birthmarks  Early detection of melanoma is key since treatment is typically straightforward and cure rates are extremely high if we catch it early.   The first sign of melanoma is often a change in a mole or a new dark spot.  The ABCDE system is a way of remembering the signs of melanoma.  A for asymmetry:  The two halves do not match. B for border:  The edges of the growth are irregular. C for color:  A mixture of colors are present instead of an even brown color. D for diameter:  Melanomas are usually (but not always) greater than 6mm - the size of a pencil eraser. E for evolution:  The spot keeps changing in  size, shape, and color.  Please check your skin once per month between visits. You can use a small mirror in front and a large mirror behind you to keep an eye on the back side or your body.   If you see any new or changing lesions before your next follow-up, please call to schedule a visit.  Please continue daily skin protection including broad spectrum sunscreen SPF 30+ to sun-exposed areas, reapplying every 2 hours as needed when you're outdoors.   Staying in the shade or wearing long sleeves, sun glasses (UVA+UVB protection) and wide brim hats (4-inch brim around the entire circumference of the hat) are also recommended for sun protection.      Due to recent changes in healthcare laws, you may see results of your pathology and/or laboratory studies on MyChart before the doctors have had a chance to review them. We understand that in some cases there may be results that are confusing or concerning to you. Please understand that not all results are received at the same time and often the doctors may need to interpret multiple results in order to provide you with the best plan of care or course of treatment. Therefore, we ask that you please give us 2 business days to thoroughly review all your results before contacting the office for clarification. Should we see a critical lab result, you will be contacted sooner.   If You Need Anything After Your Visit  If you have any questions or concerns for your doctor, please call our main line at [phone] and press option 4 to reach your doctor's medical assistant. If no one answers, please leave a voicemail as directed and we will return your call as soon as possible. Messages left after 4 pm will be answered the following business day.   You may also send us a message via MyChart. We typically respond to MyChart messages within 1-2 business days.  For prescription refills, please ask your pharmacy to contact our office. Our fax number is  [phone].  If you have an urgent issue when the clinic is closed that cannot wait until the next business day, you can page your doctor at the number below.    Please note that while we do our best to be available for urgent issues outside of office hours, we are not available 24/7.   If you have an urgent issue and are unable to reach us, you may choose to seek medical care at your doctor's office, retail clinic, urgent care center, or emergency room.  If you have a medical emergency, please immediately call 911 or go to the emergency department.  Pager Numbers  - Dr. Kowalski: [phone]  - Dr. Moye: [phone]  - Dr. Stewart: [phone]  In the event   of inclement weather, please call our main line at [phone] for an update on the status of any delays or closures.  Dermatology Medication Tips: Please keep the boxes that topical medications come in in order to help keep track of the instructions about where and how to use these. Pharmacies typically print the medication instructions only on the boxes and not directly on the medication tubes.   If your medication is too expensive, please contact our office at [phone] option 4 or send us a message through MyChart.   We are unable to tell what your co-pay for medications will be in advance as this is different depending on your insurance coverage. However, we may be able to find a substitute medication at lower cost or fill out paperwork to get insurance to cover a needed medication.   If a prior authorization is required to get your medication covered by your insurance company, please allow us 1-2 business days to complete this process.  Drug prices often vary depending on where the prescription is filled and some pharmacies may offer cheaper prices.  The website www.goodrx.com contains coupons for medications through different pharmacies. The prices here do not account for what the cost may be with help from  insurance (it may be cheaper with your insurance), but the website can give you the price if you did not use any insurance.  - You can print the associated coupon and take it with your prescription to the pharmacy.  - You may also stop by our office during regular business hours and pick up a GoodRx coupon card.  - If you need your prescription sent electronically to a different pharmacy, notify our office through [facility] MyChart or by phone at [phone] option 4.     Si Usted Necesita Algo Despus de Su Visita  Tambin puede enviarnos un mensaje a travs de MyChart. Por lo general respondemos a los mensajes de MyChart en el transcurso de 1 a 2 das hbiles.  Para renovar recetas, por favor pida a su farmacia que se ponga en contacto con nuestra oficina. Nuestro nmero de fax es el [phone].  Si tiene un asunto urgente cuando la clnica est cerrada y que no puede esperar hasta el siguiente da hbil, puede llamar/localizar a su doctor(a) al nmero que aparece a continuacin.   Por favor, tenga en cuenta que aunque hacemos todo lo posible para estar disponibles para asuntos urgentes fuera del horario de oficina, no estamos disponibles las 24 horas del da, los 7 das de la semana.   Si tiene un problema urgente y no puede comunicarse con nosotros, puede optar por buscar atencin mdica  en el consultorio de su doctor(a), en una clnica privada, en un centro de atencin urgente o en una sala de emergencias.  Si tiene una emergencia mdica, por favor llame inmediatamente al 911 o vaya a la sala de emergencias.  Nmeros de bper  - Dr. Kowalski: [phone]  - Dra. Moye: [phone]  - Dra. Stewart: [phone]  En caso de inclemencias del tiempo, por favor llame a nuestra lnea principal al [phone] para una actualizacin sobre el estado de cualquier retraso o cierre.  Consejos para la medicacin en dermatologa: Por favor, guarde las cajas en las que vienen los  medicamentos de uso tpico para ayudarle a seguir las instrucciones sobre dnde y cmo usarlos. Las farmacias generalmente imprimen las instrucciones del medicamento slo en las cajas y no directamente en los tubos del medicamento.   Si su medicamento   es muy caro, por favor, pngase en contacto con nuestra oficina llamando al [phone] y presione la opcin 4 o envenos un mensaje a travs de MyChart.   No podemos decirle cul ser su copago por los medicamentos por adelantado ya que esto es diferente dependiendo de la cobertura de su seguro. Sin embargo, es posible que podamos encontrar un medicamento sustituto a menor costo o llenar un formulario para que el seguro cubra el medicamento que se considera necesario.   Si se requiere una autorizacin previa para que su compaa de seguros cubra su medicamento, por favor permtanos de 1 a 2 das hbiles para completar este proceso.  Los precios de los medicamentos varan con frecuencia dependiendo del lugar de dnde se surte la receta y alguna farmacias pueden ofrecer precios ms baratos.  El sitio web www.goodrx.com tiene cupones para medicamentos de diferentes farmacias. Los precios aqu no tienen en cuenta lo que podra costar con la ayuda del seguro (puede ser ms barato con su seguro), pero el sitio web puede darle el precio si no utiliz ningn seguro.  - Puede imprimir el cupn correspondiente y llevarlo con su receta a la farmacia.  - Tambin puede pasar por nuestra oficina durante el horario de atencin regular y recoger una tarjeta de cupones de GoodRx.  - Si necesita que su receta se enve electrnicamente a una farmacia diferente, informe a nuestra oficina a travs de MyChart de [facility] o por telfono llamando al [phone] y presione la opcin 4.  

## 2022-08-21 NOTE — Progress Notes (Signed)
   Follow-Up Visit   Subjective  Madison Sullivan is a 42 y.o. female who presents for the following: Patient here concerning a spot at right inner thigh she states gets irritated by clothing. Patient also would like to have bx rechecked.  The patient has spots, moles and lesions to be evaluated, some may be new or changing and the patient has concerns that these could be cancer.  The following portions of the chart were reviewed this encounter and updated as appropriate: medications, allergies, medical history  Review of Systems:  No other skin or systemic complaints except as noted in HPI or Assessment and Plan.  Objective  Well appearing patient in no apparent distress; mood and affect are within normal limits. A focused examination was performed of the following areas: back , right arm, right breast , scalp  Relevant exam findings are noted in the Assessment and Plan.  right medial thigh 1.2 cm brown papule         Assessment & Plan   Bony callus (vs Nevus) scalp Exam: Subcutaneous nodule at flat flesh colored mole 0.5 cm left crown scalp nevus vs bony callus  Benign-appearing.   MELANOCYTIC NEVI Benign appearing on exam today. Recommend observation. Call clinic for new or changing moles. Recommend daily use of broad spectrum spf 30+ sunscreen to sun-exposed areas.  Exam Tan-brown and/or pink-flesh-colored symmetric macules and papules  ACTINIC DAMAGE - chronic, secondary to cumulative UV radiation exposure/sun exposure over time - diffuse scaly erythematous macules with underlying dyspigmentation - Recommend daily broad spectrum sunscreen SPF 30+ to sun-exposed areas, reapply every 2 hours as needed.  - Recommend staying in the shade or wearing long sleeves, sun glasses (UVA+UVB protection) and wide brim hats (4-inch brim around the entire circumference of the hat). - Call for new or changing lesions.  Neoplasm of uncertain behavior right medial thigh  Epidermal /  dermal shaving  Lesion diameter (cm):  1.2 Informed consent: discussed and consent obtained   Timeout: patient name, date of birth, surgical site, and procedure verified   Procedure prep:  Patient was prepped and draped in usual sterile fashion Prep type:  Isopropyl alcohol Anesthesia: the lesion was anesthetized in a standard fashion   Anesthetic:  1% lidocaine w/ epinephrine 1-100,000 buffered w/ 8.4% NaHCO3 Instrument used: flexible razor blade   Hemostasis achieved with: pressure, aluminum chloride and electrodesiccation   Outcome: patient tolerated procedure well   Post-procedure details: sterile dressing applied and wound care instructions given   Dressing type: bandage and petrolatum    Specimen 1 - Surgical pathology Differential Diagnosis: irritated nevus vs dysplasia   Check Margins: No  Irritated nevus vs dysplasia   HISTORY OF DYSPLASTIC NEVI Right medial breast, right mid lateral tricep  - No evidence of recurrence today - Recommend regular full body skin exams - Recommend daily broad spectrum sunscreen SPF 30+ to sun-exposed areas, reapply every 2 hours as needed.  - Call if any new or changing lesions are noted between office visits  FAMILY HISTORY OF SKIN CANCER What type(s):melanoma Who affected: both parents had   Return for [place] in de[place] .  IAsher Muir, CMA, am acting as scribe for Armida Sans, MD.  Documentation: I have reviewed the above documentation for accuracy and completeness, and I agree with the above.  Armida Sans, MD

## 2022-08-23 ENCOUNTER — Encounter: Payer: Self-pay | Admitting: Dermatology

## 2022-08-28 ENCOUNTER — Telehealth: Payer: Self-pay

## 2022-08-28 NOTE — Telephone Encounter (Signed)
Advised patient of results/hd  

## 2022-08-28 NOTE — Telephone Encounter (Signed)
-----   Message from Deirdre Evener, MD sent at 08/27/2022  5:51 PM EDT ----- Diagnosis Skin , right medial thigh MELANOCYTIC NEVUS, COMPOUND TYPE, IRRITATED  Benign irritated mole No further treatment needed

## 2022-09-02 ENCOUNTER — Ambulatory Visit: Payer: BC Managed Care – PPO | Admitting: Oncology

## 2022-09-20 ENCOUNTER — Inpatient Hospital Stay: Payer: BC Managed Care – PPO | Attending: Oncology | Admitting: Oncology

## 2022-09-20 ENCOUNTER — Encounter: Payer: Self-pay | Admitting: Oncology

## 2022-09-20 VITALS — BP 121/84 | HR 90 | Temp 99.9°F | Resp 16 | Ht 69.0 in | Wt 153.0 lb

## 2022-09-20 DIAGNOSIS — Z170 Estrogen receptor positive status [ER+]: Secondary | ICD-10-CM

## 2022-09-20 DIAGNOSIS — C50212 Malignant neoplasm of upper-inner quadrant of left female breast: Secondary | ICD-10-CM | POA: Insufficient documentation

## 2022-09-20 DIAGNOSIS — Z5181 Encounter for therapeutic drug level monitoring: Secondary | ICD-10-CM

## 2022-09-20 DIAGNOSIS — Z923 Personal history of irradiation: Secondary | ICD-10-CM | POA: Diagnosis not present

## 2022-09-20 DIAGNOSIS — Z79810 Long term (current) use of selective estrogen receptor modulators (SERMs): Secondary | ICD-10-CM | POA: Diagnosis not present

## 2022-09-20 NOTE — Progress Notes (Signed)
Survivorship Care Plan visit completed.  Treatment summary reviewed and given to patient.  ASCO answers booklet reviewed and given to patient.  CARE program and Cancer Transitions discussed with patient along with other resources cancer center offers to patients and caregivers.  Patient verbalized understanding.    

## 2022-09-22 NOTE — Progress Notes (Signed)
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  Diagnostic bilateral mammogram in October 2022 showed hypoechoic mass in the 11 o'clock position in the left breast measuring 1.7 x 1.3 x 1.7 cm.  Several left axillary lymph nodes prominent to mildly thickened cortices.  These are likely reactive from recent COVID-vaccine..  Left breast biopsy of the mass showed invasive mammary carcinoma grade 3 ER greater than 90% positive PR greater than 90% positive and HER2 negative.  Patient met with Dr. Lemar Livings who also biopsied one of the concerning lymph nodes in the mammogram which was negative for malignancy.  Final pathology showed a 3.5 cm grade 3 invasive mammary carcinoma with negative margins.12 lymph nodes were negative for malignancy.  Oncotype score came back at 14 and therefore patient did not require any adjuvant chemotherapy.  Patient completed adjuvant radiation therapy.  Both ovarian suppression plus AI versus tamoxifen was discussed with the patient.  Patient is currently on tamoxifen   Interval history-patient is tolerating tamoxifen well.  She has occasional joint pains which is self-limited.   Denies any significant hot flashes.  ECOG PS- 0 Pain scale- 0   Review of systems- Review of Systems  Constitutional:  Negative for chills, fever, malaise/fatigue and weight loss.  HENT:  Negative for congestion, ear discharge and nosebleeds.   Eyes:  Negative for blurred vision.  Respiratory:  Negative for cough, hemoptysis, sputum production, shortness of breath and wheezing.   Cardiovascular:  Negative for chest pain, palpitations, orthopnea and claudication.  Gastrointestinal:  Negative for abdominal pain, blood in stool, constipation, diarrhea, heartburn, melena, nausea and vomiting.  Genitourinary:  Negative for dysuria, flank pain, frequency, hematuria and urgency.  Musculoskeletal:  Negative for back pain, joint pain and myalgias.  Skin:  Negative for rash.  Neurological:  Negative for dizziness, tingling, focal weakness, seizures, weakness and headaches.  Endo/Heme/Allergies:  Does not bruise/bleed easily.  Psychiatric/Behavioral:  Negative for depression and suicidal ideas. The patient does not have insomnia.       Allergies  Allergen Reactions   Aspirin Shortness Of Breath    Avoids it due to her asthma   Iodine Rash     Past Medical History:  Diagnosis Date   Asthma    Cancer (HCC)    Dysplastic nevus 04/25/2022   R med breast - mild   Dysplastic nevus 04/25/2022   right mid lateral tricep - moderate   Family history of breast cancer    History of migraine headaches    Abstract from medicat   Personal history of radiation therapy    Uterine myoma      Past Surgical History:  Procedure Laterality Date   ADJACENT TISSUE TRANSFER/TISSUE REARRANGEMENT Left 04/16/2021   Procedure: ADJACENT TISSUE TRANSFER/TISSUE REARRANGEMENT;  Surgeon: Earline Mayotte, MD;  Location: [facility] ORS;  Service: General;  Laterality: Left;   BREAST LUMPECTOMY Left 04/16/2021   BREAST LUMPECTOMY WITH SENTINEL LYMPH NODE BIOPSY Left 04/16/2021   Procedure: BREAST LUMPECTOMY WITH  SENTINEL LYMPH NODE BX;  Surgeon: Earline Mayotte, MD;  Location: [facility] ORS;  Service: General;  Laterality: Left;   NASAL SINUS SURGERY Bilateral 1998   corrected nasal septum in [place]   UTERINE FIBROID SURGERY  2005   in [place],horrible blood loss; not cancerous, "just a growth" some  being watched by OB/GYN.    Social History   Socioeconomic History   Marital status: Single    Spouse name: Not on file   Number of children: Not on file   Years of education: 18   Highest education level: Not on file  Occupational History   Occupation: professor  Tobacco Use   Smoking status: Never   Smokeless tobacco: Never  Vaping Use   Vaping Use: Never used  Substance and Sexual Activity   Alcohol use: No    Comment: less than three mos   Drug use: No   Sexual activity: Not Currently    Birth control/protection: Inserts  Other Topics Concern   Not on file  Social History Narrative   Not on file   Social Determinants of Health   Financial Resource Strain: Not on file  Food Insecurity: Not on file  Transportation Needs: Not on file  Physical Activity: Not on file  Stress: Not on file  Social Connections: Not on file  Intimate Partner Violence: Not on file    Family History  Problem Relation Age of Onset   Endometriosis Mother    Hypertension Mother    Depression Mother    Migraines Mother    Cancer Mother        brain tumors   Skin cancer Father    Hypertension Father    Crohn's disease Father    Asthma Father    Breast cancer Paternal Grandmother    Endometriosis Sister    Depression Sister    Asthma Brother    Hip fracture Paternal Grandfather      Current Outpatient Medications:    acetaminophen (TYLENOL) 325 MG tablet, Take 650 mg by mouth every 6 (six) hours as needed for moderate pain., Disp: , Rfl:    Acetaminophen-Caffeine (TENSION HEADACHE) 500-65 MG TABS, Take 2 tablets by mouth 2 (two) times daily as needed (HEADACHE)., Disp: , Rfl:    albuterol  (PROVENTIL HFA;VENTOLIN HFA) 108 (90 Base) MCG/ACT inhaler, Inhale 2 puffs into the lungs every 6 (six) hours as needed for wheezing or shortness of breath., Disp: , Rfl:    baclofen (LIORESAL) 10 MG tablet, Take 10 mg by mouth as needed for muscle spasms., Disp: , Rfl:    cholecalciferol (VITAMIN D3) 25 MCG (1000 UNIT) tablet, Take 1,000 Units by mouth daily., Disp: , Rfl:    fluticasone (FLONASE) 50 MCG/ACT nasal spray, Place into the nose., Disp: , Rfl:    loratadine (CLARITIN) 10 MG tablet, Take 10 mg by mouth daily., Disp: , Rfl:    magnesium citrate SOLN, Take 1 Bottle by mouth once., Disp: , Rfl:    montelukast (SINGULAIR) 10 MG tablet, Take 1 tablet (10 mg total) by mouth at bedtime., Disp: 90 tablet, Rfl: 1   Multiple Vitamins-Minerals (MULTIVITAMIN ADULT) CHEW, Chew 2 each by mouth daily., Disp: , Rfl:    Omega-3 Fatty Acids (FISH OIL) 1000 MG CAPS, Take 3,000 mg  by mouth daily., Disp: , Rfl:    rizatriptan (MAXALT-MLT) 10 MG disintegrating tablet, Take by mouth., Disp: , Rfl:    sodium chloride (OCEAN) 0.65 % SOLN nasal spray, Place 1 spray into both nostrils as needed for congestion., Disp: 1 Bottle, Rfl: 6   tamoxifen (NOLVADEX) 20 MG tablet, TAKE 1 TABLET(20 MG) BY MOUTH DAILY, Disp: 90 tablet, Rfl: 3   celecoxib (CELEBREX) 200 MG capsule, Take 200 mg by mouth daily. (Patient not taking: Reported on 09/20/2022), Disp: , Rfl:   Physical exam:  Vitals:   09/20/22 1038  BP: 121/84  Pulse: 90  Resp: 16  Temp: 99.9 F (37.7 C)  TempSrc: Tympanic  SpO2: 98%  Weight: 153 lb (69.4 kg)  Height: 5\' 9"  (1.753 m)   Physical Exam Cardiovascular:     Rate and Rhythm: Normal rate and regular rhythm.     Heart sounds: Normal heart sounds.  Pulmonary:     Effort: Pulmonary effort is normal.     Breath sounds: Normal breath sounds.  Abdominal:     General: Bowel sounds are normal.     Palpations: Abdomen is soft.  Skin:    General: Skin is warm and dry.  Neurological:     Mental  Status: She is alert and oriented to person, place, and time.    Breast exam was performed in seated and lying down position. Patient is status post right lumpectomy with a well-healed surgical scar. No evidence of any palpable masses. No evidence of axillary adenopathy. No evidence of any palpable masses or lumps in the left breast. No evidence of leftt axillary adenopathy      Latest Ref Rng & Units 02/20/2022    7:49 AM  CMP  Glucose 70 - 99 mg/dL 97   BUN 6 - 24 mg/dL 16   Creatinine 8.11 - 1.00 mg/dL 9.14   Sodium 782 - 956 mmol/L 145   Potassium 3.5 - 5.2 mmol/L 4.5   Chloride 96 - 106 mmol/L 108   Calcium 8.7 - 10.2 mg/dL 9.3   Total Protein 6.0 - 8.5 g/dL 6.9   Total Bilirubin 0.0 - 1.2 mg/dL 0.2   Alkaline Phos 44 - 121 IU/L 107   AST 0 - 40 IU/L 16   ALT 0 - 32 IU/L 15       Latest Ref Rng & Units 02/20/2022    7:49 AM  CBC  WBC 3.4 - 10.8 x10E3/uL 5.2   Hemoglobin 11.1 - 15.9 g/dL 21.3   Hematocrit 08.6 - 46.6 % 38.9   Platelets 150 - 450 x10E3/uL 253      Assessment and plan- Patient is a 42 y.o. 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  She is currently on tamoxifen and this is a routine follow-up visit  Clinically patient is doing well with no concerning signs and symptoms of recurrence based on today's exam.  We are alternating mammograms with MRIs every 6 months.  Her recent MRI from February 2024 did not reveal any malignancy.  She is still getting regular menstrual cycles on tamoxifen.  Patient does have ovarian cysts which are being monitored by GYN.  It is unclear if these have been more regularly detected after starting tamoxifen.  They have not been bothering her in any way.  She is discussing with GYN about potentially starting orilissa for her ovarian cysts which is an estrogen down regulator.  This drug has not been studied in breast  cancer.  Alternative would be to proceed with ovarian suppression with aromatase inhibitor after she gets back from [place] in August.  Patient is concerned about potential downsides of ovarian suppression including worsening bone health and would like to avoid that option at this time if possible.  For now she will continue to stay on tamoxifen.  I will schedule her mammogram for August 2024 and see her in about 4 months from now.   Visit Diagnosis 1. Malignant neoplasm of upper-inner quadrant of left breast in female, estrogen receptor positive (HCC)      Dr. Owens Shark, MD, MPH [facility] [phone] 09/22/2022 9:54 AM

## 2022-09-27 ENCOUNTER — Other Ambulatory Visit: Payer: Self-pay | Admitting: Nurse Practitioner

## 2022-09-27 DIAGNOSIS — Z760 Encounter for issue of repeat prescription: Secondary | ICD-10-CM

## 2022-10-02 ENCOUNTER — Inpatient Hospital Stay: Payer: BC Managed Care – PPO | Admitting: Occupational Therapy

## 2022-10-02 ENCOUNTER — Ambulatory Visit: Payer: BC Managed Care – PPO | Admitting: Occupational Therapy

## 2022-10-02 ENCOUNTER — Telehealth: Payer: Self-pay | Admitting: *Deleted

## 2022-10-02 DIAGNOSIS — I890 Lymphedema, not elsewhere classified: Secondary | ICD-10-CM

## 2022-10-02 NOTE — Therapy (Signed)
[facility] at [facility] [address] [place], [place], [address] Phone: [phone]   Fax:  [phone]  Occupational Therapy Screen  Patient Details  Name: Madison Sullivan MRN: [id number] Date of Birth: 18-Jun-1980 No data recorded  Encounter Date: 10/02/2022   OT End of Session - 10/02/22 1735     Visit Number 0             Past Medical History:  Diagnosis Date   Asthma    Cancer (HCC)    Dysplastic nevus 04/25/2022   R med breast - mild   Dysplastic nevus 04/25/2022   right mid lateral tricep - moderate   Family history of breast cancer    History of migraine headaches    Abstract from medicat   Personal history of radiation therapy    Uterine myoma     Past Surgical History:  Procedure Laterality Date   ADJACENT TISSUE TRANSFER/TISSUE REARRANGEMENT Left 04/16/2021   Procedure: ADJACENT TISSUE TRANSFER/TISSUE REARRANGEMENT;  Surgeon: Earline Mayotte, MD;  Location: [facility] ORS;  Service: General;  Laterality: Left;   BREAST LUMPECTOMY Left 04/16/2021   BREAST LUMPECTOMY WITH SENTINEL LYMPH NODE BIOPSY Left 04/16/2021   Procedure: BREAST LUMPECTOMY WITH SENTINEL LYMPH NODE BX;  Surgeon: Earline Mayotte, MD;  Location: [facility] ORS;  Service: General;  Laterality: Left;   NASAL SINUS SURGERY Bilateral 1998   corrected nasal septum in [place]   UTERINE FIBROID SURGERY  2005   in [place],horrible blood loss; not cancerous, "just a growth" some  being watched by OB/GYN.    There were no vitals filed for this visit.   Subjective Assessment - 10/02/22 1731     Subjective  I am out of treatment about a year.  I been flying on multiple trips the last year. In 2 1/2 wks I am flying again.  No swelling or pain.  But do feel a tight, full feeling in the outside of the left breast that times and into my armpit and upper arm.    Currently in Pain? No/denies                 LYMPHEDEMA/ONCOLOGY QUESTIONNAIRE - 10/02/22  0001       Right Upper Extremity Lymphedema   15 cm Proximal to Olecranon Process 28.5 cm    10 cm Proximal to Olecranon Process 27.3 cm    Olecranon Process 26 cm    15 cm Proximal to Ulnar Styloid Process 25.4 cm    10 cm Proximal to Ulnar Styloid Process 22 cm    Just Proximal to Ulnar Styloid Process 17 cm      Left Upper Extremity Lymphedema   15 cm Proximal to Olecranon Process 29.5 cm    10 cm Proximal to Olecranon Process 27.5 cm    Olecranon Process 25.5 cm    15 cm Proximal to Ulnar Styloid Process 24.5 cm    10 cm Proximal to Ulnar Styloid Process 21.3 cm    Just Proximal to Ulnar Styloid Process 16.8 cm              DR Smith Robert 09/20/22: Assessment and plan- Patient is a 42 y.o. 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  She is currently on tamoxifen and this is a routine follow-up visit   Clinically patient is doing well with no concerning signs and symptoms of recurrence based  on today's exam.  We are alternating mammograms with MRIs every 6 months.  Her recent MRI from February 2024 did not reveal any malignancy.  She is still getting regular menstrual cycles on tamoxifen.   Patient does have ovarian cysts which are being monitored by GYN.  It is unclear if these have been more regularly detected after starting tamoxifen.  They have not been bothering her in any way.  She is discussing with GYN about potentially starting orilissa for her ovarian cysts which is an estrogen down regulator.  This drug has not been studied in breast cancer.  Alternative would be to proceed with ovarian suppression with aromatase inhibitor after she gets back from [place] in August.  Patient is concerned about potential downsides of ovarian suppression including worsening bone health and would like to avoid that option at this time if possible.  For now she will continue to stay on  tamoxifen.  I will schedule her mammogram for August 2024 and see her in about 4 months from now.   Visit Diagnosis 1. Malignant neoplasm of upper-inner quadrant of left breast in female, estrogen receptor positive (HCC)      OT SCREEN 10/02/22: Patient referred by cancer transition for follow-up after having lumpectomy with 12 lymph nodes removed on the left.  Also had radiation. Lumpectomy was 11/22.  Radiation finished January 23. Patient is a Stage manager history professor.  Patient like to do woodworking in the free time as well as some gardening.  Per patient had flown about 12 times since then  It is in 2 and half weeks to [place]. Patient deny any pain or swelling. Reports sometimes some times a  full feeling , tight feeling in the lateral left breast into the axilla and upper arm. But not consistent.  Comes and goes. Upon assessment patient's left upper extremity is increased by 1 cm compared to the right dominant hand. Patient present symptoms of stage I lymphedema. Would recommend for patient to wear a over-the-counter compression sleeve 20-30 mmHg most of the time for the next 2 weeks during the day. Jovi pack unilateral postmastectomy pad to be wear when at home. To decrease lymphedema in the thoracic. Patient was added on doing some self modified MLD 2 times a day in supine -Stimulating cervical lymph nodes as well as right axillary lymph nodes and left inguinal lymph nodes -Clear left to right AAA and left AI -Clear upper arm from elbow to shoulder -Rework left to right AAA and left AI -Clear inside to outside upper arm -Rework left to right AAA and left AI -Simulate right axillary lymph nodes as well as cervical lymph nodes and left inguinal lymph nodes  Follow-up with me in 2 weeks again.                                Visit Diagnosis: Lymphedema, not elsewhere classified    Problem List Patient Active Problem List   Diagnosis Date Noted    Genetic testing 01/29/2022   Premenopausal patient 07/23/2021   History of migraine headaches 07/23/2021   Family history of breast cancer 04/03/2021   Malignant neoplasm of upper-inner quadrant of left breast in female, estrogen receptor positive (HCC) 04/02/2021   Breast cancer, left breast (HCC) 03/20/2021   Breast mass in female 03/12/2021   Fibroids 02/08/2020   Eustachian tube dysfunction 06/24/2016   Sinusitis 06/24/2016   Bronchitis 08/29/2015   Asthma 07/19/2015  Vitamin D deficiency 03/24/2015    Oletta Cohn, OTR/L,CLT 10/02/2022, 5:35 PM  [facility] at [facility] [address], [address] [place], [place], [address] Phone: [phone]   Fax:  [phone]  Name: Kayleanna Boothby MRN: [id number] Date of Birth: [birth year]

## 2022-10-02 NOTE — Telephone Encounter (Signed)
Pt called today and left a message to see if I can send orders to [employer] to get  over the counter order through to [facility] medical for [place] for over-the-counter compression sleeve 20-30 mmHg as well as a unilateral postmastectomy Jovi pack for left upper extremity stage I lymphedema she is going to try and head over there and a little bit thank you. She is flying in 2 weeks and we just want to get things under control . I was not able to get it done before 5 pm. I did get the orders done and signed by dr Smith Robert before she left for the day. I faxed the paper with the codes for diagnosis and the fax went through. I called the patient tonight and let her know the order should be over there on fax and I got a transmission that the order went through.

## 2022-10-16 ENCOUNTER — Inpatient Hospital Stay: Payer: BC Managed Care – PPO | Admitting: Occupational Therapy

## 2022-10-16 DIAGNOSIS — I890 Lymphedema, not elsewhere classified: Secondary | ICD-10-CM

## 2022-10-16 NOTE — Therapy (Signed)
[facility] [address], [address] [place], [place], [address] Phone: [phone]   Fax:  [phone]  Occupational Therapy Screen:  Patient Details  Name: Madison Sullivan MRN: [id number] Date of Birth: [birth year] No data recorded  Encounter Date: 10/16/2022   OT End of Session - 10/16/22 1415     Visit Number 0             Past Medical History:  Diagnosis Date   Asthma    Cancer (HCC)    Dysplastic nevus 04/25/2022   R med breast - mild   Dysplastic nevus 04/25/2022   right mid lateral tricep - moderate   Family history of breast cancer    History of migraine headaches    Abstract from medicat   Personal history of radiation therapy    Uterine myoma     Past Surgical History:  Procedure Laterality Date   ADJACENT TISSUE TRANSFER/TISSUE REARRANGEMENT Left 04/16/2021   Procedure: ADJACENT TISSUE TRANSFER/TISSUE REARRANGEMENT;  Surgeon: Earline Mayotte, MD;  Location: [facility] ORS;  Service: General;  Laterality: Left;   BREAST LUMPECTOMY Left 04/16/2021   BREAST LUMPECTOMY WITH SENTINEL LYMPH NODE BIOPSY Left 04/16/2021   Procedure: BREAST LUMPECTOMY WITH SENTINEL LYMPH NODE BX;  Surgeon: Earline Mayotte, MD;  Location: [facility] ORS;  Service: General;  Laterality: Left;   NASAL SINUS SURGERY Bilateral 1998   corrected nasal septum in [place]   UTERINE FIBROID SURGERY  2005   in [place],horrible blood loss; not cancerous, "just a growth" some  being watched by OB/GYN.    There were no vitals filed for this visit.   Subjective Assessment - 10/16/22 1413     Subjective  I have been wearing the compression sleeve for about 2 weeks now during the day.  It do feel a little snug if on the computer or sitting still.  Otherwise feels good.  The Jovi pack has been doing okay.  I am sleeping with it.  I do feel some fullness and heaviness in my chest and left breast in the second part of my cycle.  I am flying in about 6 days  to [place].    Currently in Pain? No/denies                 LYMPHEDEMA/ONCOLOGY QUESTIONNAIRE - 10/16/22 0001       Left Upper Extremity Lymphedema   15 cm Proximal to Olecranon Process 28.5 cm    10 cm Proximal to Olecranon Process 27.5 cm    Olecranon Process 25.5 cm    15 cm Proximal to Ulnar Styloid Process 24.5 cm    10 cm Proximal to Ulnar Styloid Process 21.5 cm    Just Proximal to Ulnar Styloid Process 16.5 cm              DR Smith Robert 09/20/22: Assessment and plan- Patient is a 42 y.o. 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  She is currently on tamoxifen and this is a routine follow-up visit   Clinically patient is doing well with no concerning signs and symptoms of recurrence based on today's exam.  We are alternating mammograms with MRIs every 6 months.  Her recent MRI from February 2024 did not reveal any malignancy.  She is still getting regular menstrual cycles on tamoxifen.   Patient does have ovarian cysts which are being monitored by GYN.  It is unclear if these have been more regularly detected after starting tamoxifen.  They have not been bothering her in any way.  She is discussing with GYN about potentially starting orilissa for her ovarian cysts which is an estrogen down regulator.  This drug has not been studied in breast cancer.  Alternative would be to proceed with ovarian suppression with aromatase inhibitor after she gets back from [place] in August.  Patient is concerned about potential downsides of ovarian suppression including worsening bone health and would like to avoid that option at this time if possible.  For now she will continue to stay on tamoxifen.  I will schedule her mammogram for August 2024 and see her in about 4 months from now.   Visit Diagnosis 1. Malignant neoplasm of upper-inner quadrant of left breast in female, estrogen  receptor positive (HCC)         OT SCREEN 10/02/22: Patient referred by cancer transition for follow-up after having lumpectomy with 12 lymph nodes removed on the left.  Also had radiation. Lumpectomy was 11/22.  Radiation finished January 23. Patient is a Stage manager history professor.  Patient like to do woodworking in the free time as well as some gardening.  Per patient had flown about 12 times since then   It is in 2 and half weeks to [place]. Patient deny any pain or swelling. Reports sometimes some times a  full feeling , tight feeling in the lateral left breast into the axilla and upper arm. But not consistent.  Comes and goes. Upon assessment patient's left upper extremity is increased by 1 cm compared to the right dominant hand. Patient present symptoms of stage I lymphedema. Would recommend for patient to wear a over-the-counter compression sleeve 20-30 mmHg most of the time for the next 2 weeks during the day. Jovi pack unilateral postmastectomy pad to be wear when at home. To decrease lymphedema in the thoracic. Patient was added on doing some self modified MLD 2 times a day in supine -Stimulating cervical lymph nodes as well as right axillary lymph nodes and left inguinal lymph nodes -Clear left to right AAA and left AI -Clear upper arm from elbow to shoulder -Rework left to right AAA and left AI -Clear inside to outside upper arm -Rework left to right AAA and left AI -Simulate right axillary lymph nodes as well as cervical lymph nodes and left inguinal lymph nodes   Follow-up with me in 2 weeks again.    OT SCREEN 10/16/22: Patient arrived today for follow-up after being seen about 2 weeks ago.  Patient had been wearing a over-the-counter compression sleeve on left upper extremity during the day. Unilateral postmastectomy Jovi pack on the left at nighttime. Upon assessment left upper extremity circumference decreased compared to 2 weeks ago  Reports sometimes -most of the time  in the second part of her cycle-she feels full feeling , tight feeling in the lateral left breast into the axilla and upper arm. But not consistent.  Comes and goes. Patient presentedsymptoms of stage I lymphedema.  Patient done well the last 2 weeks maintaining her stage I lymphedema with over-the-counter compression sleeve 20-30 mmHg most of the time during the day.  Jovi pack unilateral postmastectomy pad at night To decrease lymphedema in the thoracic. Recommend for patient to wear her over-the-counter compression sleeve with high risk activity as well as when flying-recommended over-the-counter compression glove to wear in combination with sleeve And wear the Jovi pack as needed when feeling a  full or heavy feeling in the left breast or chest.   Patient can continue to do self modified MLD 2 times a day in supine -Stimulating cervical lymph nodes as well as right axillary lymph nodes and left inguinal lymph nodes -Clear left to right AAA and left AI -Clear upper arm from elbow to shoulder -Rework left to right AAA and left AI -Clear inside to outside upper arm -Rework left to right AAA and left AI -Simulate right axillary lymph nodes as well as cervical lymph nodes and left inguinal lymph nodes     Provided patient with contact information about a massage therapist in town specializing And lymphatic massage when she come back Patient to follow-up with me about a week after coming back from [place]                                  Visit Diagnosis: Lymphedema, not elsewhere classified    Problem List Patient Active Problem List   Diagnosis Date Noted   Genetic testing 01/29/2022   Premenopausal patient 07/23/2021   History of migraine headaches 07/23/2021   Family history of breast cancer 04/03/2021   Malignant neoplasm of upper-inner quadrant of left breast in female, estrogen receptor positive (HCC) 04/02/2021   Breast cancer, left breast (HCC)  03/20/2021   Breast mass in female 03/12/2021   Fibroids 02/08/2020   Eustachian tube dysfunction 06/24/2016   Sinusitis 06/24/2016   Bronchitis 08/29/2015   Asthma 07/19/2015   Vitamin D deficiency 03/24/2015    Oletta Cohn, OTR/L,CLT 10/16/2022, 2:16 PM  [facility] [address] [place], [place], [address] Phone: [phone]   Fax:  [phone]  Name: Rahf Rosdahl MRN: [id number] Date of Birth: 25-Oct-1980

## 2022-10-21 ENCOUNTER — Ambulatory Visit (INDEPENDENT_AMBULATORY_CARE_PROVIDER_SITE_OTHER): Payer: Self-pay | Admitting: Physician Assistant

## 2022-10-21 VITALS — BP 118/80 | HR 98 | Temp 99.4°F | Resp 16 | Wt 153.4 lb

## 2022-10-21 DIAGNOSIS — H00015 Hordeolum externum left lower eyelid: Secondary | ICD-10-CM

## 2022-10-21 MED ORDER — ERYTHROMYCIN 5 MG/GM OP OINT
1.0000 | TOPICAL_OINTMENT | Freq: Three times a day (TID) | OPHTHALMIC | 0 refills | Status: DC
Start: 2022-10-21 — End: 2023-07-21

## 2022-10-21 NOTE — Progress Notes (Signed)
[facility] [address] [place], [place] [address]   Office Visit Note  Patient Name: Madison Sullivan Date of Birth [id number]  Medical Record number [id number]  Date of Service: 10/21/2022  Chief Complaint  Patient presents with   Eye Problem     42 y/o F presents to the clinic for left eye redness of the lower eyelid and slight swelling x9-10 days. +h/o hordeolums. Denies vision changes. NO excessive tearing. NO drainage. NO known exposure to conjunctivitis. Denies eye pain. Does not wear contact lenses.   Eye Problem  Associated symptoms include eye redness. Pertinent negatives include no eye discharge, itching or photophobia.      Current Medication:  Outpatient Encounter Medications as of 10/21/2022  Medication Sig   erythromycin ophthalmic ointment Place 1 Application into the left eye 3 (three) times daily.   acetaminophen (TYLENOL) 325 MG tablet Take 650 mg by mouth every 6 (six) hours as needed for moderate pain.   Acetaminophen-Caffeine (TENSION HEADACHE) 500-65 MG TABS Take 2 tablets by mouth 2 (two) times daily as needed (HEADACHE).   albuterol (PROVENTIL HFA;VENTOLIN HFA) 108 (90 Base) MCG/ACT inhaler Inhale 2 puffs into the lungs every 6 (six) hours as needed for wheezing or shortness of breath.   baclofen (LIORESAL) 10 MG tablet Take 10 mg by mouth as needed for muscle spasms.   celecoxib (CELEBREX) 200 MG capsule Take 200 mg by mouth daily. (Patient not taking: Reported on 09/20/2022)   cholecalciferol (VITAMIN D3) 25 MCG (1000 UNIT) tablet Take 1,000 Units by mouth daily.   fluticasone (FLONASE) 50 MCG/ACT nasal spray Place into the nose.   loratadine (CLARITIN) 10 MG tablet Take 10 mg by mouth daily.   magnesium citrate SOLN Take 1 Bottle by mouth once.   montelukast (SINGULAIR) 10 MG tablet Take 1 tablet (10 mg total) by mouth at bedtime.   Multiple Vitamins-Minerals (MULTIVITAMIN ADULT) CHEW Chew 2 each by mouth daily.   Omega-3 Fatty Acids (FISH OIL) 1000  MG CAPS Take 3,000 mg by mouth daily.   rizatriptan (MAXALT-MLT) 10 MG disintegrating tablet Take by mouth.   sodium chloride (OCEAN) 0.65 % SOLN nasal spray Place 1 spray into both nostrils as needed for congestion.   tamoxifen (NOLVADEX) 20 MG tablet TAKE 1 TABLET(20 MG) BY MOUTH DAILY   No facility-administered encounter medications on file as of 10/21/2022.      Medical History: Past Medical History:  Diagnosis Date   Asthma    Cancer (HCC)    Dysplastic nevus 04/25/2022   R med breast - mild   Dysplastic nevus 04/25/2022   right mid lateral tricep - moderate   Family history of breast cancer    History of migraine headaches    Abstract from medicat   Personal history of radiation therapy    Uterine myoma      Vital Signs: BP 118/80 (BP Location: Right Arm, Patient Position: Sitting, Cuff Size: Large)   Pulse 98   Temp 99.4 F (37.4 C) (Tympanic)   Resp 16   Wt 153 lb 6.4 oz (69.6 kg)   SpO2 98%   BMI 22.65 kg/m    Review of Systems  Constitutional: Negative.   HENT: Negative.    Eyes:  Positive for redness. Negative for photophobia, pain (discomfort), discharge, itching and visual disturbance.  Neurological: Negative.     Physical Exam Constitutional:      Appearance: She is normal weight.  HENT:     Right Ear: External ear normal.  Left Ear: External ear normal.     Mouth/Throat:     Mouth: Mucous membranes are moist.     Pharynx: Oropharynx is clear.  Eyes:     General:        Right eye: No discharge.        Left eye: Hordeolum present.No discharge.     Extraocular Movements: Extraocular movements intact.     Right eye: Normal extraocular motion.     Left eye: Normal extraocular motion.     Conjunctiva/sclera: Conjunctivae normal.     Pupils: Pupils are equal, round, and reactive to light.   Neurological:     Mental Status: She is alert.       Assessment/Plan:  1. Hordeolum externum of left lower eyelid - erythromycin ophthalmic  ointment; Place 1 Application into the left eye 3 (three) times daily.  Dispense: 3.5 g; Refill: 0  Apply warm moist compresses Purchase lid scrub and use as instructed on the box If symptoms don't improve then start erythromycin eye ointment as prescribed.  Pt verbalized understanding and in agreement.   General Counseling: Madison Sullivan verbalizes understanding of the findings of todays visit and agrees with plan of treatment. I have discussed any further diagnostic evaluation that may be needed or ordered today. We also reviewed her medications today. she has been encouraged to call the office with any questions or concerns that should arise related to todays visit.    Time spent:20 Minutes    Gilberto Better, [place] Physician Assistant

## 2023-01-06 ENCOUNTER — Ambulatory Visit: Payer: BC Managed Care – PPO | Admitting: Radiation Oncology

## 2023-01-14 ENCOUNTER — Other Ambulatory Visit: Payer: Self-pay | Admitting: Oncology

## 2023-01-14 ENCOUNTER — Ambulatory Visit
Admission: RE | Admit: 2023-01-14 | Discharge: 2023-01-14 | Disposition: A | Discharge status: Home / Self Care | Payer: BC Managed Care – PPO | Source: Ambulatory Visit | Attending: Oncology | Admitting: Oncology

## 2023-01-14 DIAGNOSIS — Z170 Estrogen receptor positive status [ER+]: Secondary | ICD-10-CM | POA: Insufficient documentation

## 2023-01-14 DIAGNOSIS — C50212 Malignant neoplasm of upper-inner quadrant of left female breast: Secondary | ICD-10-CM | POA: Insufficient documentation

## 2023-01-14 DIAGNOSIS — Z5181 Encounter for therapeutic drug level monitoring: Secondary | ICD-10-CM

## 2023-01-24 ENCOUNTER — Ambulatory Visit: Payer: BC Managed Care – PPO | Admitting: Oncology

## 2023-01-24 ENCOUNTER — Encounter: Payer: Self-pay | Admitting: Oncology

## 2023-01-24 ENCOUNTER — Inpatient Hospital Stay: Payer: BC Managed Care – PPO | Attending: Oncology | Admitting: Oncology

## 2023-01-24 VITALS — BP 119/82 | HR 83 | Temp 98.9°F | Resp 18 | Ht 69.0 in | Wt 156.6 lb

## 2023-01-24 DIAGNOSIS — Z170 Estrogen receptor positive status [ER+]: Secondary | ICD-10-CM | POA: Diagnosis not present

## 2023-01-24 DIAGNOSIS — Z923 Personal history of irradiation: Secondary | ICD-10-CM | POA: Diagnosis not present

## 2023-01-24 DIAGNOSIS — Z79899 Other long term (current) drug therapy: Secondary | ICD-10-CM | POA: Diagnosis not present

## 2023-01-24 DIAGNOSIS — Z79810 Long term (current) use of selective estrogen receptor modulators (SERMs): Secondary | ICD-10-CM | POA: Diagnosis not present

## 2023-01-24 DIAGNOSIS — N6001 Solitary cyst of right breast: Secondary | ICD-10-CM | POA: Diagnosis not present

## 2023-01-24 DIAGNOSIS — Z5181 Encounter for therapeutic drug level monitoring: Secondary | ICD-10-CM

## 2023-01-24 DIAGNOSIS — Z853 Personal history of malignant neoplasm of breast: Secondary | ICD-10-CM

## 2023-01-24 DIAGNOSIS — C50212 Malignant neoplasm of upper-inner quadrant of left female breast: Secondary | ICD-10-CM | POA: Diagnosis present

## 2023-01-26 NOTE — Progress Notes (Signed)
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  Diagnostic bilateral mammogram in October 2022 showed hypoechoic mass in the 11 o'clock position in the left breast measuring 1.7 x 1.3 x 1.7 cm.  Several left axillary lymph nodes prominent to mildly thickened cortices.  These are likely reactive from recent COVID-vaccine..  Left breast biopsy of the mass showed invasive mammary carcinoma grade 3 ER greater than 90% positive PR greater than 90% positive and HER2 negative.  Patient met with Dr. Lemar Livings who also biopsied one of the concerning lymph nodes in the mammogram which was negative for malignancy.  Final pathology showed a 3.5 cm grade 3 invasive mammary carcinoma with negative margins.12 lymph nodes were negative for malignancy.  Oncotype score came back at 14 and therefore patient did not require any adjuvant chemotherapy.  Patient completed adjuvant radiation therapy.  Both ovarian suppression plus AI versus tamoxifen was discussed with the patient.  Patient is currently on tamoxifen. She does get regular menstrual cycles on tamoxifen    Interval history-she is tolerating tamoxifen well  without any significant side effects.  Does get occasional joint pains in her knees and hips which are overall self-limited.  She has been following up with GYN for ovarian cysts which have overall remained stable.  Denies any breast concerns.  She was noted to have small right breast cysts which will continue to be monitored.  ECOG PS- 0 Pain scale- 0   Review of systems- Review of Systems  Constitutional:  Negative for chills, fever, malaise/fatigue and weight loss.  HENT:  Negative for congestion, ear discharge and nosebleeds.   Eyes:  Negative for blurred vision.  Respiratory:  Negative for cough, hemoptysis, sputum production, shortness of breath and wheezing.   Cardiovascular:  Negative for chest pain, palpitations, orthopnea and claudication.  Gastrointestinal:  Negative for abdominal pain, blood in stool, constipation, diarrhea, heartburn, melena, nausea and vomiting.  Genitourinary:  Negative for dysuria, flank pain, frequency, hematuria and urgency.  Musculoskeletal:  Negative for back pain, joint pain and myalgias.  Skin:  Negative for rash.  Neurological:  Negative for dizziness, tingling, focal weakness, seizures, weakness and headaches.  Endo/Heme/Allergies:  Does not bruise/bleed easily.  Psychiatric/Behavioral:  Negative for depression and suicidal ideas. The patient does not have insomnia.       Allergies  Allergen Reactions   Aspirin Shortness Of Breath    Avoids it due to her asthma   Iodine Rash     Past Medical History:  Diagnosis Date   Asthma    Cancer (HCC)    Dysplastic nevus 04/25/2022   R med breast - mild   Dysplastic nevus 04/25/2022   right mid lateral tricep - moderate   Family history of breast  cancer    History of migraine headaches    Abstract from medicat   Personal history of radiation therapy    Uterine myoma      Past Surgical History:  Procedure Laterality Date   ADJACENT TISSUE TRANSFER/TISSUE REARRANGEMENT Left 04/16/2021    Procedure: ADJACENT TISSUE TRANSFER/TISSUE REARRANGEMENT;  Surgeon: Earline Mayotte, MD;  Location: [facility] ORS;  Service: General;  Laterality: Left;   BREAST LUMPECTOMY Left 04/16/2021   BREAST LUMPECTOMY WITH SENTINEL LYMPH NODE BIOPSY Left 04/16/2021   Procedure: BREAST LUMPECTOMY WITH SENTINEL LYMPH NODE BX;  Surgeon: Earline Mayotte, MD;  Location: [facility] ORS;  Service: General;  Laterality: Left;   NASAL SINUS SURGERY Bilateral 1998   corrected nasal septum in [place]   UTERINE FIBROID SURGERY  2005   in [place],horrible blood loss; not cancerous, "just a growth" some  being watched by OB/GYN.    Social History   Socioeconomic History   Marital status: Single    Spouse name: Not on file   Number of children: Not on file   Years of education: 18   Highest education level: Not on file  Occupational History   Occupation: professor  Tobacco Use   Smoking status: Never   Smokeless tobacco: Never  Vaping Use   Vaping status: Never Used  Substance and Sexual Activity   Alcohol use: No    Comment: less than three mos   Drug use: No   Sexual activity: Not Currently    Birth control/protection: Inserts  Other Topics Concern   Not on file  Social History Narrative   Not on file   Social Determinants of Health   Financial Resource Strain: Not on file  Food Insecurity: Not on file  Transportation Needs: Not on file  Physical Activity: Not on file  Stress: Not on file  Social Connections: Not on file  Intimate Partner Violence: Not on file    Family History  Problem Relation Age of Onset   Endometriosis Mother    Hypertension Mother    Depression Mother    Migraines Mother    Cancer Mother        brain tumors   Skin cancer Father    Hypertension Father    Crohn's disease Father    Asthma Father    Breast cancer Paternal Grandmother    Endometriosis Sister    Depression Sister    Asthma Brother    Hip fracture Paternal Grandfather      Current Outpatient  Medications:    acetaminophen (TYLENOL) 325 MG tablet, Take 650 mg by mouth every 6 (six) hours as needed for moderate pain., Disp: , Rfl:    Acetaminophen-Caffeine (TENSION HEADACHE) 500-65 MG TABS, Take 2 tablets by mouth 2 (two) times daily as needed (HEADACHE)., Disp: , Rfl:    albuterol (PROVENTIL HFA;VENTOLIN HFA) 108 (90 Base) MCG/ACT inhaler, Inhale 2 puffs into the lungs every 6 (six) hours as needed for wheezing or shortness of breath., Disp: , Rfl:    baclofen (LIORESAL) 10 MG tablet, Take 10 mg by mouth as needed for muscle spasms., Disp: , Rfl:    celecoxib (CELEBREX) 200 MG capsule, Take 200 mg by mouth daily., Disp: , Rfl:    cholecalciferol (VITAMIN D3) 25 MCG (1000 UNIT) tablet, Take 1,000 Units by mouth daily., Disp: , Rfl:    erythromycin ophthalmic ointment, Place 1 Application into the left eye 3 (three) times daily., Disp: 3.5 g, Rfl: 0   fluticasone (FLONASE) 50 MCG/ACT nasal spray,  Place into the nose., Disp: , Rfl:    loratadine (CLARITIN) 10 MG tablet, Take 10 mg by mouth daily., Disp: , Rfl:    magnesium citrate SOLN, Take 1 Bottle by mouth once., Disp: , Rfl:    montelukast (SINGULAIR) 10 MG tablet, Take 1 tablet (10 mg total) by mouth at bedtime., Disp: 90 tablet, Rfl: 1   Multiple Vitamins-Minerals (MULTIVITAMIN ADULT) CHEW, Chew 2 each by mouth daily., Disp: , Rfl:    Omega-3 Fatty Acids (FISH OIL) 1000 MG CAPS, Take 3,000 mg by mouth daily., Disp: , Rfl:    rizatriptan (MAXALT-MLT) 10 MG disintegrating tablet, Take by mouth., Disp: , Rfl:    sodium chloride (OCEAN) 0.65 % SOLN nasal spray, Place 1 spray into both nostrils as needed for congestion., Disp: 1 Bottle, Rfl: 6   tamoxifen (NOLVADEX) 20 MG tablet, TAKE 1 TABLET(20 MG) BY MOUTH DAILY, Disp: 90 tablet, Rfl: 3  Physical exam:  Vitals:   01/24/23 1524  BP: 119/82  Pulse: 83  Resp: 18  Temp: 98.9 F (37.2 C)  TempSrc: Tympanic  SpO2: 99%  Weight: 156 lb 9.6 oz (71 kg)  Height: 5\' 9"  (1.753 m)    Physical Exam Cardiovascular:     Rate and Rhythm: Normal rate and regular rhythm.     Heart sounds: Normal heart sounds.  Pulmonary:     Effort: Pulmonary effort is normal.     Breath sounds: Normal breath sounds.  Skin:    General: Skin is warm and dry.  Neurological:     Mental Status: She is alert and oriented to person, place, and time.    Breast exam was performed in seated and lying down position. Patient is status post left lumpectomy with a well-healed surgical scar. No evidence of any palpable masses. No evidence of axillary adenopathy. No evidence of any palpable masses or lumps in the right breast. No evidence of right axillary adenopathy      Latest Ref Rng & Units 02/20/2022    7:49 AM  CMP  Glucose 70 - 99 mg/dL 97   BUN 6 - 24 mg/dL 16   Creatinine 5.62 - 1.00 mg/dL 1.30   Sodium 865 - 784 mmol/L 145   Potassium 3.5 - 5.2 mmol/L 4.5   Chloride 96 - 106 mmol/L 108   Calcium 8.7 - 10.2 mg/dL 9.3   Total Protein 6.0 - 8.5 g/dL 6.9   Total Bilirubin 0.0 - 1.2 mg/dL 0.2   Alkaline Phos 44 - 121 IU/L 107   AST 0 - 40 IU/L 16   ALT 0 - 32 IU/L 15       Latest Ref Rng & Units 02/20/2022    7:49 AM  CBC  WBC 3.4 - 10.8 x10E3/uL 5.2   Hemoglobin 11.1 - 15.9 g/dL 69.6   Hematocrit 29.5 - 46.6 % 38.9   Platelets 150 - 450 x10E3/uL 253     No images are attached to the encounter.  MM 3D DIAGNOSTIC MAMMOGRAM BILATERAL BREAST  Result Date: 01/14/2023 CLINICAL DATA:  History of treated left breast cancer, status post lumpectomy and radiation therapy in 2022. EXAM: DIGITAL DIAGNOSTIC BILATERAL MAMMOGRAM WITH TOMOSYNTHESIS AND CAD; ULTRASOUND RIGHT BREAST LIMITED TECHNIQUE: Bilateral digital diagnostic mammography and breast tomosynthesis was performed. The images were evaluated with computer-aided detection. ; Targeted ultrasound examination of the right breast was performed COMPARISON:  Previous exam(s). ACR Breast Density Category c: The breasts are heterogeneously  dense, which may obscure small masses. FINDINGS: Mammographically, there are  no new suspicious masses or areas of nonsurgical architectural distortion in the left breast. Maturing post lumpectomy changes are seen in the upper central left breast, posterior depth. Mammographically, there are 3 2-3 mm nodules in the upper right breast, far posterior depth, seen on the MLO view only. Targeted right breast ultrasound is performed and demonstrates no suspicious masses or shadowing lesions in the upper right breast. In the right breast 1 o'clock 3 cm from nipple there is a benign cyst measuring 0.5 x 0.4 x 0.4 cm. A second 3 mm cyst is seen at the same clock position. These findings likely correspond to the mammographically seen nodules. IMPRESSION: No mammographic evidence of left breast malignancy. Right breast probably benign cyst, for which short-term imaging follow-up is recommended. RECOMMENDATION: The patient is scheduled for a breast MRI in February 2025, which could serve as a short-term imaging follow-up. If second-look ultrasound or mammography is recommended on the basis of the MRI report, then these may be scheduled at a later time. I have discussed the findings and recommendations with the patient. If applicable, a reminder letter will be sent to the patient regarding the next appointment. BI-RADS CATEGORY  3: Probably benign. Electronically Signed   By: Ted Mcalpine M.D.   On: 01/14/2023 12:52   [place] LIMITED ULTRASOUND INCLUDING AXILLA RIGHT BREAST  Result Date: 01/14/2023 CLINICAL DATA:  History of treated left breast cancer, status post lumpectomy and radiation therapy in 2022. EXAM: DIGITAL DIAGNOSTIC BILATERAL MAMMOGRAM WITH TOMOSYNTHESIS AND CAD; ULTRASOUND RIGHT BREAST LIMITED TECHNIQUE: Bilateral digital diagnostic mammography and breast tomosynthesis was performed. The images were evaluated with computer-aided detection. ; Targeted ultrasound examination of the right breast was performed  COMPARISON:  Previous exam(s). ACR Breast Density Category c: The breasts are heterogeneously dense, which may obscure small masses. FINDINGS: Mammographically, there are no new suspicious masses or areas of nonsurgical architectural distortion in the left breast. Maturing post lumpectomy changes are seen in the upper central left breast, posterior depth. Mammographically, there are 3 2-3 mm nodules in the upper right breast, far posterior depth, seen on the MLO view only. Targeted right breast ultrasound is performed and demonstrates no suspicious masses or shadowing lesions in the upper right breast. In the right breast 1 o'clock 3 cm from nipple there is a benign cyst measuring 0.5 x 0.4 x 0.4 cm. A second 3 mm cyst is seen at the same clock position. These findings likely correspond to the mammographically seen nodules. IMPRESSION: No mammographic evidence of left breast malignancy. Right breast probably benign cyst, for which short-term imaging follow-up is recommended. RECOMMENDATION: The patient is scheduled for a breast MRI in February 2025, which could serve as a short-term imaging follow-up. If second-look ultrasound or mammography is recommended on the basis of the MRI report, then these may be scheduled at a later time. I have discussed the findings and recommendations with the patient. If applicable, a reminder letter will be sent to the patient regarding the next appointment. BI-RADS CATEGORY  3: Probably benign. Electronically Signed   By: Ted Mcalpine M.D.   On: 01/14/2023 12:52     Assessment and plan- Patient is a 42 y.o. 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  Routine follow-up of breast cancer on tamoxifen  Clinically patient is doing well and tolerating tamoxifen without any significant side effects.  She does get regular menstrual cycles on tamoxifen.  We have  discussed about a 2% disease-free survival advantage with ovarian suppression plus aromatase inhibitors.  Patient wishes to avoid ovarian suppression if possible especially given her constant travel schedule.  She also does not wish to go through bilateral oophorectomy at this time.  She had a diagnostic mammogram in August 2024 which showed 0.5 cm benign cyst and another 3 mm cyst in the same location.  This will continue to be monitored and we will plan to get an MRI of her bilateral breasts in February 2024  I will see her back in 6 months no labs   Visit Diagnosis 1. Encounter for follow-up surveillance of breast cancer   2. Encounter for monitoring tamoxifen therapy      Dr. Owens Shark, MD, MPH [facility] [phone] 01/26/2023 9:26 AM

## 2023-04-08 ENCOUNTER — Other Ambulatory Visit: Payer: Self-pay | Admitting: Oncology

## 2023-04-24 ENCOUNTER — Ambulatory Visit: Payer: BC Managed Care – PPO | Admitting: Dermatology

## 2023-04-24 ENCOUNTER — Encounter: Payer: Self-pay | Admitting: Dermatology

## 2023-04-24 DIAGNOSIS — W908XXA Exposure to other nonionizing radiation, initial encounter: Secondary | ICD-10-CM

## 2023-04-24 DIAGNOSIS — Z808 Family history of malignant neoplasm of other organs or systems: Secondary | ICD-10-CM

## 2023-04-24 DIAGNOSIS — Z86018 Personal history of other benign neoplasm: Secondary | ICD-10-CM

## 2023-04-24 DIAGNOSIS — L720 Epidermal cyst: Secondary | ICD-10-CM

## 2023-04-24 DIAGNOSIS — L821 Other seborrheic keratosis: Secondary | ICD-10-CM

## 2023-04-24 DIAGNOSIS — L578 Other skin changes due to chronic exposure to nonionizing radiation: Secondary | ICD-10-CM | POA: Diagnosis not present

## 2023-04-24 DIAGNOSIS — D2272 Melanocytic nevi of left lower limb, including hip: Secondary | ICD-10-CM

## 2023-04-24 DIAGNOSIS — W57XXXA Bitten or stung by nonvenomous insect and other nonvenomous arthropods, initial encounter: Secondary | ICD-10-CM

## 2023-04-24 DIAGNOSIS — S70362A Insect bite (nonvenomous), left thigh, initial encounter: Secondary | ICD-10-CM

## 2023-04-24 DIAGNOSIS — Z79899 Other long term (current) drug therapy: Secondary | ICD-10-CM

## 2023-04-24 DIAGNOSIS — Z1283 Encounter for screening for malignant neoplasm of skin: Secondary | ICD-10-CM | POA: Diagnosis not present

## 2023-04-24 DIAGNOSIS — L814 Other melanin hyperpigmentation: Secondary | ICD-10-CM

## 2023-04-24 DIAGNOSIS — Z7189 Other specified counseling: Secondary | ICD-10-CM

## 2023-04-24 DIAGNOSIS — L918 Other hypertrophic disorders of the skin: Secondary | ICD-10-CM

## 2023-04-24 DIAGNOSIS — D229 Melanocytic nevi, unspecified: Secondary | ICD-10-CM

## 2023-04-24 DIAGNOSIS — D2271 Melanocytic nevi of right lower limb, including hip: Secondary | ICD-10-CM

## 2023-04-24 DIAGNOSIS — S40262A Insect bite (nonvenomous) of left shoulder, initial encounter: Secondary | ICD-10-CM

## 2023-04-24 DIAGNOSIS — L729 Follicular cyst of the skin and subcutaneous tissue, unspecified: Secondary | ICD-10-CM

## 2023-04-24 MED ORDER — MUPIROCIN 2 % EX OINT: 1.0000 | TOPICAL_OINTMENT | Freq: Every day | CUTANEOUS | 0 refills | Status: DC

## 2023-04-24 NOTE — Patient Instructions (Signed)

## 2023-04-24 NOTE — Progress Notes (Signed)
Follow-Up Visit   Subjective  Madison Sullivan is a 42 y.o. female who presents for the following: Skin Cancer Screening and Full Body Skin Exam  The patient presents for Total-Body Skin Exam (TBSE) for skin cancer screening and mole check. The patient has spots, moles and lesions to be evaluated, some may be new or changing and the patient may have concern these could be cancer.  Hx dysplastic nevi. Patient has a spot behind right ear, a few at left upper thigh.  The following portions of the chart were reviewed this encounter and updated as appropriate: medications, allergies, medical history  Review of Systems:  No other skin or systemic complaints except as noted in HPI or Assessment and Plan.  Objective  Well appearing patient in no apparent distress; mood and affect are within normal limits.  A full examination was performed including scalp, head, eyes, ears, nose, lips, neck, chest, axillae, abdomen, back, buttocks, bilateral upper extremities, bilateral lower extremities, hands, feet, fingers, toes, fingernails, and toenails. All findings within normal limits unless otherwise noted below.   Relevant physical exam findings are noted in the Assessment and Plan.  left shoulder, left upper thigh Pink papules   Assessment & Plan   SKIN CANCER SCREENING PERFORMED TODAY.  ACTINIC DAMAGE - Chronic condition, secondary to cumulative UV/sun exposure - diffuse scaly erythematous macules with underlying dyspigmentation - Recommend daily broad spectrum sunscreen SPF 30+ to sun-exposed areas, reapply every 2 hours as needed.  - Staying in the shade or wearing long sleeves, sun glasses (UVA+UVB protection) and wide brim hats (4-inch brim around the entire circumference of the hat) are also recommended for sun protection.  - Call for new or changing lesions.  LENTIGINES, SEBORRHEIC KERATOSES, HEMANGIOMAS - Benign normal skin lesions - Benign-appearing - Call for any changes  MELANOCYTIC  NEVI - Tan-brown and/or pink-flesh-colored symmetric macules and papules - Benign appearing on exam today - Observation - Call clinic for new or changing moles - Recommend daily use of broad spectrum spf 30+ sunscreen to sun-exposed areas.   - left ant medial sole arch area   0.45 x 0.3 cm  - right lateral sole   0.4 x 0.3 cm           FAMILY HISTORY OF SKIN CANCER What type(s): melanoma Who affected: both parents  EPIDERMAL INCLUSION CYST Exam: Subcutaneous nodule at right preauricular   Benign-appearing. Exam most consistent with an epidermal inclusion cyst. Discussed that a cyst is a benign growth that can grow over time and sometimes get irritated or inflamed. Recommend observation if it is not bothersome. Discussed option of surgical excision to remove it if it is growing, symptomatic, or other changes noted. Please call for new or changing lesions so they can be evaluated.  Acrochordons (Skin Tags) - Fleshy, skin-colored pedunculated papules - Benign appearing.  - Observe. - If desired, they can be removed with an in office procedure that is not covered by insurance. - Please call the clinic if you notice any new or changing lesions.  HISTORY OF DYSPLASTIC NEVI Right medial breast, right mid lateral tricep  - No evidence of recurrence today - Recommend regular full body skin exams - Recommend daily broad spectrum sunscreen SPF 30+ to sun-exposed areas, reapply every 2 hours as needed.  - Call if any new or changing lesions are noted between office visits   REACTION TO INSECT BITE left shoulder, left upper thigh Start mupirocin daily to aa as needed.   Return in  about 1 year (around 04/23/2024) for TBSE, with Dr. Kirtland Bouchard, Fhx MM, Hx Dysplastic Nevi.  Anise Salvo, RMA, am acting as scribe for Armida Sans, MD .   Documentation: I have reviewed the above documentation for accuracy and completeness, and I agree with the above.  Armida Sans, MD

## 2023-06-12 ENCOUNTER — Ambulatory Visit (INDEPENDENT_AMBULATORY_CARE_PROVIDER_SITE_OTHER): Payer: Self-pay | Admitting: Adult Health

## 2023-06-12 ENCOUNTER — Encounter: Payer: Self-pay | Admitting: Adult Health

## 2023-06-12 VITALS — BP 140/76 | HR 113 | Temp 98.6°F | Wt 160.0 lb

## 2023-06-12 DIAGNOSIS — H1033 Unspecified acute conjunctivitis, bilateral: Secondary | ICD-10-CM

## 2023-06-12 DIAGNOSIS — H65113 Acute and subacute allergic otitis media (mucoid) (sanguinous) (serous), bilateral: Secondary | ICD-10-CM

## 2023-06-12 MED ORDER — AMOXICILLIN 500 MG PO CAPS
500.0000 mg | ORAL_CAPSULE | Freq: Three times a day (TID) | ORAL | 0 refills | Status: AC
Start: 2023-06-12 — End: 2023-06-22

## 2023-06-12 MED ORDER — GENTAMICIN SULFATE 0.3 % OP SOLN
1.0000 [drp] | OPHTHALMIC | 0 refills | Status: DC
Start: 2023-06-12 — End: 2023-07-21

## 2023-06-12 NOTE — Progress Notes (Signed)
[facility] [address] [place], [place] [address]   Office Visit Note  Patient Name: Madison Sullivan Date of Birth [id number]  Medical Record number [id number]  Date of Service: 06/12/2023  Chief Complaint  Patient presents with   Acute Visit     HPI Pt is here for a sick visit. She reports her niece and nephew were sick last week.  About 5 days ago she started having sore throat and nasal congestion.  She just traveled back from [place] 2 days ago.  The plan caused her fluid buildup in her ears, but yesterday when she woke up her eyes were red and watering. She is using Sudafed, and a nasal sprays.  Denies fever or chills.   Current Medication:  Outpatient Encounter Medications as of 06/12/2023  Medication Sig   acetaminophen (TYLENOL) 325 MG tablet Take 650 mg by mouth every 6 (six) hours as needed for moderate pain.   Acetaminophen-Caffeine (TENSION HEADACHE) 500-65 MG TABS Take 2 tablets by mouth 2 (two) times daily as needed (HEADACHE).   albuterol (PROVENTIL HFA;VENTOLIN HFA) 108 (90 Base) MCG/ACT inhaler Inhale 2 puffs into the lungs every 6 (six) hours as needed for wheezing or shortness of breath.   amoxicillin (AMOXIL) 500 MG capsule Take 1 capsule (500 mg total) by mouth 3 (three) times daily for 10 days.   baclofen (LIORESAL) 10 MG tablet Take 10 mg by mouth as needed for muscle spasms.   celecoxib (CELEBREX) 200 MG capsule Take 200 mg by mouth daily.   cholecalciferol (VITAMIN D3) 25 MCG (1000 UNIT) tablet Take 1,000 Units by mouth daily.   erythromycin ophthalmic ointment Place 1 Application into the left eye 3 (three) times daily.   fluticasone (FLONASE) 50 MCG/ACT nasal spray Place into the nose.   gentamicin (GARAMYCIN) 0.3 % ophthalmic solution Place 1 drop into both eyes every 4 (four) hours.   loratadine (CLARITIN) 10 MG tablet Take 10 mg by mouth daily.   magnesium citrate SOLN Take 1 Bottle by mouth once.   Multiple Vitamins-Minerals (MULTIVITAMIN ADULT)  CHEW Chew 2 each by mouth daily.   mupirocin ointment (BACTROBAN) 2 % Apply 1 Application topically daily.   Omega-3 Fatty Acids (FISH OIL) 1000 MG CAPS Take 3,000 mg by mouth daily.   rizatriptan (MAXALT-MLT) 10 MG disintegrating tablet Take by mouth.   sodium chloride (OCEAN) 0.65 % SOLN nasal spray Place 1 spray into both nostrils as needed for congestion.   tamoxifen (NOLVADEX) 20 MG tablet TAKE 1 TABLET(20 MG) BY MOUTH DAILY   montelukast (SINGULAIR) 10 MG tablet Take 1 tablet (10 mg total) by mouth at bedtime.   No facility-administered encounter medications on file as of 06/12/2023.      Medical History: Past Medical History:  Diagnosis Date   Asthma    Cancer (HCC)    Dysplastic nevus 04/25/2022   R med breast - mild   Dysplastic nevus 04/25/2022   right mid lateral tricep - moderate   Family history of breast cancer    History of migraine headaches    Abstract from medicat   Personal history of radiation therapy    Uterine myoma      Vital Signs: BP (!) 140/76   Pulse (!) 113   Temp 98.6 F (37 C) (Tympanic)   Wt 160 lb (72.6 kg)   SpO2 99%   BMI 23.63 kg/m    Review of Systems  Constitutional:  Negative for chills, fatigue and fever.  HENT:  Positive  for ear pain.   Eyes:  Negative for pain and redness.  Respiratory:  Negative for cough.   Cardiovascular:  Negative for chest pain.  Gastrointestinal:  Negative for diarrhea, nausea and vomiting.    Physical Exam Constitutional:      Appearance: Normal appearance.  HENT:     Right Ear: A middle ear effusion is present. There is no impacted cerumen. Tympanic membrane is injected. Tympanic membrane is not erythematous.     Left Ear: A middle ear effusion is present. There is no impacted cerumen. Tympanic membrane is injected. Tympanic membrane is not erythematous.  Neurological:     Mental Status: She is alert.    Assessment/Plan: 1. Non-recurrent acute allergic otitis media of both ears  (Primary) Take amox as prescribed. Follow up via MyChart messenger if symptoms fail to improve or may return to clinic as needed for worsening symptoms.   - amoxicillin (AMOXIL) 500 MG capsule; Take 1 capsule (500 mg total) by mouth 3 (three) times daily for 10 days.  Dispense: 30 capsule; Refill: 0  2. Acute bacterial conjunctivitis of both eyes Use Gentamicin eye drops, 1 drop to affected eye(s) every 4 hours while awake.  Use them until symptosm resolve.  Wash hands frequently, and avoid touching eyes.   - gentamicin (GARAMYCIN) 0.3 % ophthalmic solution; Place 1 drop into both eyes every 4 (four) hours.  Dispense: 5 mL; Refill: 0     General Counseling: Madison Sullivan verbalizes understanding of the findings of todays visit and agrees with plan of treatment. I have discussed any further diagnostic evaluation that may be needed or ordered today. We also reviewed her medications today. she has been encouraged to call the office with any questions or concerns that should arise related to todays visit.   No orders of the defined types were placed in this encounter.   Meds ordered this encounter  Medications   amoxicillin (AMOXIL) 500 MG capsule    Sig: Take 1 capsule (500 mg total) by mouth 3 (three) times daily for 10 days.    Dispense:  30 capsule    Refill:  0   gentamicin (GARAMYCIN) 0.3 % ophthalmic solution    Sig: Place 1 drop into both eyes every 4 (four) hours.    Dispense:  5 mL    Refill:  0    Time spent:15 Minutes    Johnna Acosta AGNP-C Nurse Practitioner

## 2023-06-23 ENCOUNTER — Ambulatory Visit
Admission: RE | Admit: 2023-06-23 | Discharge: 2023-06-23 | Disposition: A | Discharge status: Home / Self Care | Payer: BC Managed Care – PPO | Source: Ambulatory Visit | Attending: Oncology | Admitting: Oncology

## 2023-06-23 DIAGNOSIS — Z08 Encounter for follow-up examination after completed treatment for malignant neoplasm: Secondary | ICD-10-CM | POA: Insufficient documentation

## 2023-06-23 DIAGNOSIS — Z853 Personal history of malignant neoplasm of breast: Secondary | ICD-10-CM | POA: Diagnosis present

## 2023-06-23 MED ORDER — GADOBUTROL 1 MMOL/ML IV SOLN
7.0000 mL | Freq: Once | INTRAVENOUS | Status: AC | PRN
  Administered 2023-06-23: 7 mL via INTRAVENOUS

## 2023-07-14 ENCOUNTER — Ambulatory Visit: Payer: BC Managed Care – PPO | Admitting: Radiation Oncology

## 2023-07-17 ENCOUNTER — Ambulatory Visit: Payer: BC Managed Care – PPO | Admitting: Radiation Oncology

## 2023-07-21 ENCOUNTER — Ambulatory Visit
Admission: RE | Admit: 2023-07-21 | Discharge: 2023-07-21 | Disposition: A | Discharge status: Home / Self Care | Payer: BC Managed Care – PPO | Source: Ambulatory Visit | Attending: Radiation Oncology | Admitting: Radiation Oncology

## 2023-07-21 ENCOUNTER — Encounter: Payer: Self-pay | Admitting: Radiation Oncology

## 2023-07-21 VITALS — BP 126/89 | HR 106 | Temp 98.3°F | Resp 16 | Wt 160.0 lb

## 2023-07-21 DIAGNOSIS — Z79810 Long term (current) use of selective estrogen receptor modulators (SERMs): Secondary | ICD-10-CM | POA: Insufficient documentation

## 2023-07-21 DIAGNOSIS — Z170 Estrogen receptor positive status [ER+]: Secondary | ICD-10-CM | POA: Insufficient documentation

## 2023-07-21 DIAGNOSIS — C50212 Malignant neoplasm of upper-inner quadrant of left female breast: Secondary | ICD-10-CM | POA: Diagnosis present

## 2023-07-21 DIAGNOSIS — Z923 Personal history of irradiation: Secondary | ICD-10-CM | POA: Insufficient documentation

## 2023-07-21 NOTE — Progress Notes (Signed)
 Radiation Oncology Follow up Note  Name: Madison Sullivan   Date:   07/21/2023 MRN:  [id number] DOB: Mar 29, 1981    This 43 y.o. 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  REFERRING PROVIDER: Pleas Koch*  HPI: Patient is a 43 year old female now out over 2 years having completed whole breast radiation to her left breast for stage IIa ER positive invasive mammary carcinoma.  Seen today in routine follow-up she is doing well.  She occasionally has some pains in her left breast consistent with scarring.  She recently had.  An MRI scan showing no evidence of breast malignancy.  She also had mammograms back in August showing no mammographic evidence in her left breast she did have a probable benign cyst in the right breast.  She is currently on tamoxifen tolerating that well without side effect  COMPLICATIONS OF TREATMENT: none  FOLLOW UP COMPLIANCE: keeps appointments   PHYSICAL EXAM:  BP 126/89   Pulse (!) 106   Temp 98.3 F (36.8 C) (Tympanic)   Resp 16   Wt 160 lb (72.6 kg)   BMI 23.63 kg/m  Lungs are clear to A&P cardiac examination essentially unremarkable with regular rate and rhythm. No dominant mass or nodularity is noted in either breast in 2 positions examined. Incision is well-healed. No axillary or supraclavicular adenopathy is appreciated. Cosmetic result is excellent.  Well-developed well-nourished patient in NAD. HEENT reveals PERLA, EOMI, discs not visualized.  Oral cavity is clear. No oral mucosal lesions are identified. Neck is clear without evidence of cervical or supraclavicular adenopathy. Lungs are clear to A&P. Cardiac examination is essentially unremarkable with regular rate and rhythm without murmur rub or thrill. Abdomen is benign with no organomegaly or masses noted. Motor sensory and DTR levels are equal and symmetric in the  upper and lower extremities. Cranial nerves II through XII are grossly intact. Proprioception is intact. No peripheral adenopathy or edema is identified. No motor or sensory levels are noted. Crude visual fields are within normal range.  RADIOLOGY RESULTS: MRI and mammograms reviewed compatible with above-stated findings  PLAN: Time patient is now out 2 years with no evidence of disease.  And pleased with her overall progress.  At this time I will turn follow-up care over to medical oncology.  I be happy to reevaluate the patient anytime should that be indicated.  I would like to take this opportunity to thank you for allowing me to participate in the care of your patient.Carmina Miller, MD

## 2023-07-25 ENCOUNTER — Ambulatory Visit: Payer: BC Managed Care – PPO | Admitting: Oncology

## 2023-08-25 ENCOUNTER — Inpatient Hospital Stay: Payer: BC Managed Care – PPO | Attending: Oncology | Admitting: Oncology

## 2023-08-25 ENCOUNTER — Encounter: Payer: Self-pay | Admitting: Oncology

## 2023-08-25 VITALS — BP 118/83 | HR 83 | Temp 98.3°F | Resp 16 | Wt 159.8 lb

## 2023-08-25 DIAGNOSIS — Z170 Estrogen receptor positive status [ER+]: Secondary | ICD-10-CM

## 2023-08-25 DIAGNOSIS — Z1721 Progesterone receptor positive status: Secondary | ICD-10-CM

## 2023-08-25 DIAGNOSIS — Z1732 Human epidermal growth factor receptor 2 negative status: Secondary | ICD-10-CM

## 2023-08-25 DIAGNOSIS — Z08 Encounter for follow-up examination after completed treatment for malignant neoplasm: Secondary | ICD-10-CM

## 2023-08-25 DIAGNOSIS — Z803 Family history of malignant neoplasm of breast: Secondary | ICD-10-CM | POA: Insufficient documentation

## 2023-08-25 DIAGNOSIS — Z79899 Other long term (current) drug therapy: Secondary | ICD-10-CM | POA: Diagnosis not present

## 2023-08-25 DIAGNOSIS — Z923 Personal history of irradiation: Secondary | ICD-10-CM | POA: Insufficient documentation

## 2023-08-25 DIAGNOSIS — C50212 Malignant neoplasm of upper-inner quadrant of left female breast: Secondary | ICD-10-CM

## 2023-08-25 DIAGNOSIS — Z5181 Encounter for therapeutic drug level monitoring: Secondary | ICD-10-CM

## 2023-08-25 NOTE — Progress Notes (Signed)
 Hematology/Oncology Consult note [facility]  [phone] [phone]  Patient Care Team: Enid Baas, MD as PCP - General (Internal Medicine) Carmina Miller, MD as Consulting Physician (Radiation Oncology) Creig Hines, MD as Consulting Physician (Oncology) Lemar Livings Merrily Pew, MD as Referring Physician (General Surgery)   Name of the patient: Madison Sullivan  [id number]  1980/10/06   Date of visit: 08/25/23  Diagnosis- pathological prognostic stage IB invasive mammary carcinoma of the left breast pT2 N0 M0 ER/PR positive HER2 negative status postlumpectomy   Chief complaint/ Reason for visit-routine follow-up of breast cancer presently on tamoxifen  Heme/Onc history:  Patient is a 43 year old female who noticed fullness in the upper aspect of the left breast which she brought to the attention of GYN.  Diagnostic bilateral mammogram in October 2022 showed hypoechoic mass in the 11 o'clock position in the left breast measuring 1.7 x 1.3 x 1.7 cm.  Several left axillary lymph nodes prominent to mildly thickened cortices.  These are likely reactive from recent COVID-vaccine..  Left breast biopsy of the mass showed invasive mammary carcinoma grade 3 ER greater than 90% positive PR greater than 90% positive and HER2 negative.  Patient met with Dr. Lemar Livings who also biopsied one of the concerning lymph nodes in the mammogram which was negative for malignancy.  Final pathology showed a 3.5 cm grade 3 invasive mammary carcinoma with negative margins.12 lymph nodes were negative for malignancy.  Oncotype score came back at 14 and therefore patient did not require any adjuvant chemotherapy.  Patient completed adjuvant radiation therapy.  Both ovarian suppression plus AI versus tamoxifen was discussed with the patient.  Patient is currently on tamoxifen. She does get regular menstrual cycles on tamoxifen   Interval history-she has been following up with  GYN for her history of ovarian cysts which have remained stable.  Uterine fibroid has been stable to mildly increasing in size.  She was also found to have a uterine polyp recently and will be undergoing further workup with GYN as well.  She is tolerating tamoxifen well for the most part and denies any significant side effects.  She still gets regular menstrual cycles on tamoxifen  ECOG PS- 0 Pain scale- 0   Review of systems- Review of Systems  Constitutional:  Negative for chills, fever, malaise/fatigue and weight loss.  HENT:  Negative for congestion, ear discharge and nosebleeds.   Eyes:  Negative for blurred vision.  Respiratory:  Negative for cough, hemoptysis, sputum production, shortness of breath and wheezing.   Cardiovascular:  Negative for chest pain, palpitations, orthopnea and claudication.  Gastrointestinal:  Negative for abdominal pain, blood in stool, constipation, diarrhea, heartburn, melena, nausea and vomiting.  Genitourinary:  Negative for dysuria, flank pain, frequency, hematuria and urgency.  Musculoskeletal:  Negative for back pain, joint pain and myalgias.  Skin:  Negative for rash.  Neurological:  Negative for dizziness, tingling, focal weakness, seizures, weakness and headaches.  Endo/Heme/Allergies:  Does not bruise/bleed easily.  Psychiatric/Behavioral:  Negative for depression and suicidal ideas. The patient does not have insomnia.       Allergies  Allergen Reactions   Aspirin Shortness Of Breath    Avoids it due to her asthma   Iodine Rash     Past Medical History:  Diagnosis Date   Asthma    Cancer (HCC)    Dysplastic nevus 04/25/2022   R med breast - mild   Dysplastic nevus 04/25/2022   right mid lateral tricep -  moderate   Family history of breast cancer    History of migraine headaches    Abstract from medicat   Personal history of radiation therapy    Uterine myoma      Past Surgical History:  Procedure Laterality Date   ADJACENT  TISSUE TRANSFER/TISSUE REARRANGEMENT Left 04/16/2021   Procedure: ADJACENT TISSUE TRANSFER/TISSUE REARRANGEMENT;  Surgeon: Earline Mayotte, MD;  Location: [facility] ORS;  Service: General;  Laterality: Left;   BREAST LUMPECTOMY Left 04/16/2021   BREAST LUMPECTOMY WITH SENTINEL LYMPH NODE BIOPSY Left 04/16/2021   Procedure: BREAST LUMPECTOMY WITH SENTINEL LYMPH NODE BX;  Surgeon: Earline Mayotte, MD;  Location: [facility] ORS;  Service: General;  Laterality: Left;   NASAL SINUS SURGERY Bilateral 1998   corrected nasal septum in [place]   UTERINE FIBROID SURGERY  2005   in [place],horrible blood loss; not cancerous, "just a growth" some  being watched by OB/GYN.    Social History   Socioeconomic History   Marital status: Single    Spouse name: Not on file   Number of children: Not on file   Years of education: 18   Highest education level: Not on file  Occupational History   Occupation: professor  Tobacco Use   Smoking status: Never   Smokeless tobacco: Never  Vaping Use   Vaping status: Never Used  Substance and Sexual Activity   Alcohol use: No    Comment: less than three mos   Drug use: No   Sexual activity: Not Currently    Birth control/protection: Inserts  Other Topics Concern   Not on file  Social History Narrative   Not on file   Social Drivers of Health   Financial Resource Strain: Low Risk  (06/25/2023)   Received from [facility]   Overall Financial Resource Strain (CARDIA)    Difficulty of Paying Living Expenses: Not hard at all  Food Insecurity: No Food Insecurity (06/25/2023)   Received from [facility]   Hunger Vital Sign    Worried About Running Out of Food in the Last Year: Never true    Ran Out of Food in the Last Year: Never true  Transportation Needs: No Transportation Needs (06/25/2023)   Received from [facility] - Transportation    In the past 12 months, has lack of transportation kept you  from medical appointments or from getting medications?: No    Lack of Transportation (Non-Medical): No  Physical Activity: Not on file  Stress: Not on file  Social Connections: Not on file  Intimate Partner Violence: Not on file    Family History  Problem Relation Age of Onset   Endometriosis Mother    Hypertension Mother    Depression Mother    Migraines Mother    Cancer Mother        brain tumors   Skin cancer Father    Hypertension Father    Crohn's disease Father    Asthma Father    Breast cancer Paternal Grandmother    Endometriosis Sister    Depression Sister    Asthma Brother    Hip fracture Paternal Grandfather      Current Outpatient Medications:    acetaminophen (TYLENOL) 325 MG tablet, Take 650 mg by mouth every 6 (six) hours as needed for moderate pain., Disp: , Rfl:    Acetaminophen-Caffeine (TENSION HEADACHE) 500-65 MG TABS, Take 2 tablets by mouth 2 (two) times daily as needed (HEADACHE)., Disp: , Rfl:  albuterol (PROVENTIL HFA;VENTOLIN HFA) 108 (90 Base) MCG/ACT inhaler, Inhale 2 puffs into the lungs every 6 (six) hours as needed for wheezing or shortness of breath., Disp: , Rfl:    baclofen (LIORESAL) 10 MG tablet, Take 10 mg by mouth as needed for muscle spasms., Disp: , Rfl:    celecoxib (CELEBREX) 200 MG capsule, Take 200 mg by mouth daily., Disp: , Rfl:    cholecalciferol (VITAMIN D3) 25 MCG (1000 UNIT) tablet, Take 1,000 Units by mouth daily., Disp: , Rfl:    fluticasone (FLONASE) 50 MCG/ACT nasal spray, Place into the nose., Disp: , Rfl:    loratadine (CLARITIN) 10 MG tablet, Take 10 mg by mouth daily., Disp: , Rfl:    magnesium citrate SOLN, Take 1 Bottle by mouth once., Disp: , Rfl:    montelukast (SINGULAIR) 10 MG tablet, Take 1 tablet (10 mg total) by mouth at bedtime., Disp: 90 tablet, Rfl: 1   Multiple Vitamins-Minerals (MULTIVITAMIN ADULT) CHEW, Chew 2 each by mouth daily., Disp: , Rfl:    mupirocin ointment (BACTROBAN) 2 %, Apply 1 Application  topically daily., Disp: 22 g, Rfl: 0   Omega-3 Fatty Acids (FISH OIL) 1000 MG CAPS, Take 3,000 mg by mouth daily., Disp: , Rfl:    rizatriptan (MAXALT-MLT) 10 MG disintegrating tablet, Take by mouth., Disp: , Rfl:    sodium chloride (OCEAN) 0.65 % SOLN nasal spray, Place 1 spray into both nostrils as needed for congestion., Disp: 1 Bottle, Rfl: 6   tamoxifen (NOLVADEX) 20 MG tablet, TAKE 1 TABLET(20 MG) BY MOUTH DAILY, Disp: 90 tablet, Rfl: 3   XDEMVY 0.25 % SOLN, INSTILL ONE DROP INTO BOTH EYES TWICE A DAY FOR SIX WEEKS AS DIRECTED, Disp: , Rfl:   Physical exam:  Vitals:   08/25/23 1000  BP: 118/83  Pulse: 83  Resp: 16  Temp: 98.3 F (36.8 C)  TempSrc: Tympanic  SpO2: 99%  Weight: 159 lb 12.8 oz (72.5 kg)   Physical Exam Cardiovascular:     Rate and Rhythm: Normal rate and regular rhythm.     Heart sounds: Normal heart sounds.  Pulmonary:     Effort: Pulmonary effort is normal.     Breath sounds: Normal breath sounds.  Skin:    General: Skin is warm and dry.  Neurological:     Mental Status: She is alert and oriented to person, place, and time.    Breast exam was performed in seated and lying down position. Patient is status post left lumpectomy with a well-healed surgical scar. No evidence of any palpable masses. No evidence of axillary adenopathy. No evidence of any palpable masses or lumps in the right breast. No evidence of right axillary adenopathy   I have personally reviewed labs listed below:    Latest Ref Rng & Units 02/20/2022    7:49 AM  CMP  Glucose 70 - 99 mg/dL 97   BUN 6 - 24 mg/dL 16   Creatinine 1.61 - 1.00 mg/dL 0.96   Sodium 045 - 409 mmol/L 145   Potassium 3.5 - 5.2 mmol/L 4.5   Chloride 96 - 106 mmol/L 108   Calcium 8.7 - 10.2 mg/dL 9.3   Total Protein 6.0 - 8.5 g/dL 6.9   Total Bilirubin 0.0 - 1.2 mg/dL 0.2   Alkaline Phos 44 - 121 IU/L 107   AST 0 - 40 IU/L 16   ALT 0 - 32 IU/L 15       Latest Ref Rng & Units 02/20/2022  7:49 AM  CBC   WBC 3.4 - 10.8 x10E3/uL 5.2   Hemoglobin 11.1 - 15.9 g/dL 29.5   Hematocrit 62.1 - 46.6 % 38.9   Platelets 150 - 450 x10E3/uL 253     Assessment and plan- Patient is a 43 y.o. female With history of stage I left breast cancer T2 N0 M0 ER/PR positive HER2 negative s/p lumpectomy adjuvant radiation therapy and presently on tamoxifen.  She is here for routine follow-up  Clinically patient is doing well with no concerning signs and symptoms of recurrence based on todays exam. She will be due for a mammogram in August 2025 which we will schedule.  Her MRI from February 2025 was unremarkable.  She is presently on tamoxifen and tolerating it well without any significant side effects and will continue with that.  If she continues to have issues with uterine polyps or worsening ovarian cysts etc. she will let us know if she is willing to consider ovarian suppression plus aromatase inhibitor.  I will see her back in 6 months no labs with a bone density scan prior   Visit Diagnosis 1. Encounter for follow-up surveillance of breast cancer   2. Encounter for monitoring tamoxifen therapy      Dr. Owens Shark, MD, MPH [facility] at [facility] [phone] 08/25/2023 11:26 AM

## 2023-08-25 NOTE — Progress Notes (Signed)
 Would like to discuss Tamoxifen with new GYN polyp diagnosis.

## 2023-09-08 NOTE — H&P (Signed)
 Preoperative History and Physical   Chief Complaint: Madison Sullivan is a 43 y.o. G0P0000 here for surgical management of Endometrial polyp.   No significant preoperative concerns.   History of Present Illness: 43 y.o. G0P0000 female who has a history of breast cancer that is ER/PR positive and diagnosed in 03/2021. She takes Tamoxifen  for this. She has been monitored for slow-growing fibroids. On her last ultrasound she was noted to have a polypoid lesion in her uterus.  This was confirmed today on ultrasound by a saline sonohysterogram.    Proposed surgery: Hysteroscopy, dilation and curettage, endometrial polypectomy   Past Medical History      Past Medical History:  Diagnosis Date   Allergy in my youth   Anemia 2005    this was due to a surgery, normalized within a year   Asthma without status asthmaticus (HHS-HCC)     Benign tumor of uterus      myomatous    Breast cancer (CMS/HHS-HCC) 03/21/2021    left   Bronchitis     Ear infection     Fibroid     Migraine headache     Neuro-degenerative disorders ()      Migraines, Occipital Neuralgia      Past Surgical History       Past Surgical History:  Procedure Laterality Date   nose surgery   1998   uterine fibroids   2005   NEEDLE BIOPSY LYMPH NODE AXILLARY Left 03/2021   ultrasound guided core breast biopsy Left 03/21/2021   MASTECTOMY PARTIAL / LUMPECTOMY Left 04/16/2021    Wide excision of 3.7 cm tumor with 3 mm minimal margin, 5 sentinel nodes, 7 adjacent nodes negative.  Genetic testing negative.  Tissue transfer technique during closure.   BREAST BIOPSY   03/21/2021    Tumor, left side   LYMPH NODE BIOPSY   03/27/2021   PR BX/REMV,LYMPH NODE,DEEP AXILL   04/16/2021    Sentinel Lymph node removal (12)              OB History  Gravida Para Term Preterm AB Living  0 0 0 0 0 0  SAB IAB Ectopic Molar Multiple Live Births   0 0 0 0 0 0  Obstetric Comments  Age at first period 24     Patient denies any other  pertinent gynecologic issues.          Current Outpatient Medications on File Prior to Visit  Medication Sig Dispense Refill   acetaminophen -caffeine  500-65 mg Tab Take by mouth       albuterol 90 mcg/actuation inhaler Inhale 2 inhalations into the lungs every 6 (six) hours as needed for Wheezing.       baclofen (LIORESAL) 10 MG tablet Take 10 mg by mouth as needed       celecoxib (CELEBREX) 200 MG capsule TAKE 1 CAPSULE(200 MG) BY MOUTH TWICE DAILY AS NEEDED FOR PAIN 60 capsule 1   cholecalciferol (VITAMIN D3) 1000 unit tablet Take by mouth       docosahexaenoic acid-epa 120-180 mg Cap Take by mouth       fluticasone  propionate (FLONASE) 50 mcg/actuation nasal spray Place into one nostril       loratadine (CLARITIN) 10 mg tablet Take 10 mg by mouth once daily.       montelukast  (SINGULAIR ) 10 mg tablet TAKE 1 TABLET(10 MG) BY MOUTH AT BEDTIME 90 tablet 1   ondansetron  (ZOFRAN ) 8 MG tablet Take 1 tablet (8 mg total) by  mouth every 8 (eight) hours as needed for Nausea 20 tablet 0   promethazine  (PHENERGAN ) 25 MG tablet Take 1 tablet (25 mg total) by mouth every 6 (six) hours as needed for Nausea 30 tablet 11   rizatriptan (MAXALT-MLT) 10 MG disintegrating tablet Take 1 tablet (10 mg total) by mouth as directed for Migraine for up to 365 doses 6 tablet 11   tamoxifen  (NOLVADEX ) 20 MG tablet Take 20 mg by mouth once daily          Allergies       Allergies  Allergen Reactions   Asprin Ec Low Dose [Aspirin] Shortness Of Breath      Asthmatic   Iodine Rash        Social History:   reports that she has never smoked. She has never used smokeless tobacco. She reports that she does not currently use alcohol. She reports that she does not use drugs.   Family History        Family History  Problem Relation Name Age of Onset   Breast cancer Mother Mother     Skin cancer Mother Mother          basalioma   Cancer Mother Mother          Breast   Endometriosis Mother Mother     High blood  pressure (Hypertension) Mother Mother     Migraines Mother Mother     Depression Mother Mother     Thyroid disease Mother Mother     Asthma Father Father     Skin cancer Father Father     Crohn's disease Father Father     Cancer Father Father          Skin   High blood pressure (Hypertension) Father Father     Migraines Sister sister     Endometriosis Sister sister     Depression Sister sister     Asthma Brother Brother     Breast cancer Paternal Grandmother Grandmother          postmenopausal   Hip fracture Paternal Games developer     Colon cancer Neg Hx       Ovarian cancer Neg Hx       Pancreatic cancer Neg Hx       Prostate cancer Neg Hx            Review of Systems: Noncontributory   PHYSICAL EXAM: Blood pressure (!) 130/93, pulse 90, height 175.3 cm (5\' 9" ), weight 71.2 kg (157 lb), last menstrual period 08/19/2023. CONSTITUTIONAL: Well-developed, well-nourished female in no acute distress.  HENT:  Normocephalic, atraumatic, External right and left ear normal. Oropharynx is clear and moist EYES: Conjunctivae and EOM are normal. Pupils are equal, round, and reactive to light. No scleral icterus.  NECK: Normal range of motion, supple, no masses SKIN: Skin is warm and dry. No rash noted. Not diaphoretic. No erythema. No pallor. NEUROLGIC: Alert and oriented to person, place, and time. Normal reflexes, muscle tone coordination. No cranial nerve deficit noted. PSYCHIATRIC: Normal mood and affect. Normal behavior. Normal judgment and thought content. CARDIOVASCULAR: Normal heart rate noted, regular rhythm RESPIRATORY: Effort and breath sounds normal, no problems with respiration noted ABDOMEN: Soft, nontender, nondistended. PELVIC: Deferred MUSCULOSKELETAL: Normal range of motion. No edema and no tenderness. 2+ distal pulses.   Labs: Recent Results  No results found for this or any previous visit (from the past 2 weeks).     Imaging Results US  [facility]  sonohysterography  Result Date: 09/08/2023 Indication ======== Saline Infusion Sonohysterogram for the indication of suspected polyp Results ====== TO VIEW THE FORMAL REPORT IN [facility], OPEN THE PDF (click on Imaging result with paper clip icon, then click link under "Scans on Order"). The plain text version does not include embedded images or graphs. The image link is for reference only. ViewPoint is the official diagnostic image storage site. Endovaginal Imaging ================ Indication: Endovaginal imaging was necessary to evaluate the uterus and ovaries. Probe: The probe used for this examination was FOCXVR. Uterus ====== Visualized. Size 92 mm x 92 mm x 52 mm Position: anteverted Malformations: none Myometrium: appears normal Endometrium: normal. Endometrial thickness, total 13.2 mm Right Ovary ========= Visualized. Size 40 mm x 24 mm x 20 mm Left Ovary ======== Visualized. Size 45 mm x 43 mm x 31 mm. Corpus luteum: hemorrhagic. Size 25.0 mm x 18.0 mm x 22.0 mm Cyst(s)    hemorrhagic corpus luteum Cul de Sac ========= Visualized. No free fluid visualized Impression ========= Known fibroid uterus. Please see measurements obtained on 08/04/23.. Saline ultrasound: No submucosal fibroids were appreciated. With intrauterine saline contrast in place, the endometrium appears to have a 8 x 6 x 7 mm hyperechoic structure on the left anterior wall. On 3D rendering of the uterine cavity, no uterine malformations are suspected. See separate procedure note for Saline-infusion sonohysterogram.    Assessment: 1. Endometrial polyp   2. Uterine leiomyoma, unspecified location   3. Malignant neoplasm of overlapping sites of left breast in female, estrogen receptor positive (CMS/HHS-HCC)       Plan: Patient will undergo surgical management with the above-noted surgery.   The risks of surgery were discussed in detail with the patient including but not limited to: bleeding which may require transfusion or reoperation;  infection which may require antibiotics; injury to surrounding organs which may involve bowel, bladder, ureters ; need for additional procedures including laparoscopy or laparotomy; thromboembolic phenomenon, surgical site problems and other postoperative/anesthesia complications. Likelihood of success in alleviating the patient's condition was discussed. Routine postoperative instructions will be reviewed with the patient and her family in detail after surgery.  The patient concurred with the proposed plan, giving informed written consent for the surgery.   Preoperative prophylactic antibiotics, as indicated, and SCDs ordered on call to the OR.       Attestation Statement:    I personally performed the service. (TP)   Chidera Dearcos Teddy Fear, MD  [facility] OB[facility]GYN [facility] 09/08/2023 1:18 PM

## 2023-09-11 ENCOUNTER — Encounter
Admission: RE | Admit: 2023-09-11 | Discharge: 2023-09-11 | Disposition: A | Discharge status: Home / Self Care | Source: Ambulatory Visit | Attending: Obstetrics and Gynecology | Admitting: Obstetrics and Gynecology

## 2023-09-11 ENCOUNTER — Other Ambulatory Visit: Payer: Self-pay

## 2023-09-11 DIAGNOSIS — Z01812 Encounter for preprocedural laboratory examination: Secondary | ICD-10-CM

## 2023-09-11 HISTORY — DX: Polyp of corpus uteri: N84.0

## 2023-09-11 HISTORY — DX: Headache, unspecified: R51.9

## 2023-09-11 HISTORY — DX: Anemia, unspecified: D64.9

## 2023-09-11 HISTORY — DX: Other specified postprocedural states: Z98.890

## 2023-09-11 HISTORY — DX: Pneumonia, unspecified organism: J18.9

## 2023-09-11 HISTORY — DX: Other specified postprocedural states: R11.2

## 2023-09-11 HISTORY — DX: Benign neoplasm of connective and other soft tissue, unspecified: D21.9

## 2023-09-11 NOTE — Patient Instructions (Addendum)
 Your procedure is scheduled on: 09/19/23 - Friday Report to the Registration Desk on the 1st floor of the [facility]. To find out your arrival time, please call [phone] between 1PM - 3PM on: 05/01 - Thursday If your arrival time is 6:00 am, do not arrive before that time as the [facility] entrance doors do not open until 6:00 am. [facility] [address] [place] , [address] , to pick up your Ensure Pre surgery drink.  REMEMBER: Instructions that are not followed completely may result in serious medical risk, up to and including death; or upon the discretion of your surgeon and anesthesiologist your surgery may need to be rescheduled.  Do not eat food after midnight the night before surgery.  No gum chewing or hard candies.  You may however, drink CLEAR liquids up to 2 hours before you are scheduled to arrive for your surgery. Do not drink anything within 2 hours of your scheduled arrival time.  Clear liquids include: - water   - apple juice without pulp - gatorade (not RED colors) - black coffee or tea (Do NOT add milk or creamers to the coffee or tea) Do NOT drink anything that is not on this list.  You have been ordered an ENSURE Pre surgery , complete drinking 2 hours before your scheduled arrival time on the day of surgery. ( See the information sheet)   One week prior to surgery: Stop Anti-inflammatories (NSAIDS) such as Advil , Aleve, Ibuprofen , Motrin , Naproxen, Naprosyn and Aspirin based products such as Excedrin, Goody's Powder, BC Powder. You may take Tylenol  if needed for pain up until the day of surgery.  Stop ANY OVER THE COUNTER supplements until after surgery :MAGNESIUM GLYCINATE , MULTIVITAMIN     ON THE DAY OF SURGERY ONLY TAKE THESE MEDICATIONS WITH SIPS OF WATER :  XDEMVY  tamoxifen  (NOLVADEX )    Use inhaler albuterol (PROVENTIL)  on the day of surgery and bring to the hospital.  No Alcohol for 24 hours before or after  surgery.  No Smoking including e-cigarettes for 24 hours before surgery.  No chewable tobacco products for at least 6 hours before surgery.  No nicotine patches on the day of surgery.  Do not use any "recreational" drugs for at least a week (preferably 2 weeks) before your surgery.  Please be advised that the combination of cocaine and anesthesia may have negative outcomes, up to and including death. If you test positive for cocaine, your surgery will be cancelled.  On the morning of surgery brush your teeth with toothpaste and water , you may rinse your mouth with mouthwash if you wish. Do not swallow any toothpaste or mouthwash.  Do not wear jewelry, make-up, hairpins, clips or nail polish.  For welded (permanent) jewelry: bracelets, anklets, waist bands, etc.  Please have this removed prior to surgery.  If it is not removed, there is a chance that hospital personnel will need to cut it off on the day of surgery.  Do not wear lotions, powders, or perfumes.   Do not shave body hair from the neck down 48 hours before surgery.  Contact lenses, hearing aids and dentures may not be worn into surgery.  Do not bring valuables to the hospital. [facility] is not responsible for any missing/lost belongings or valuables.   Notify your doctor if there is any change in your medical condition (cold, fever, infection).  Wear comfortable clothing (specific to your surgery type) to the hospital.  After surgery,  you can help prevent lung complications by doing breathing exercises.  Take deep breaths and cough every 1-2 hours. Your doctor may order a device called an Incentive Spirometer to help you take deep breaths.  When coughing or sneezing, hold a pillow firmly against your incision with both hands. This is called "splinting." Doing this helps protect your incision. It also decreases belly discomfort.  If you are being admitted to the hospital overnight, leave your suitcase in the car. After  surgery it may be brought to your room.  In case of increased patient census, it may be necessary for you, the patient, to continue your postoperative care in the Same Day Surgery department.  If you are being discharged the day of surgery, you will not be allowed to drive home. You will need a responsible individual to drive you home and stay with you for 24 hours after surgery.   If you are taking public transportation, you will need to have a responsible individual with you.  Please call the Pre-admissions Testing Dept. at [phone] if you have any questions about these instructions.  Surgery Visitation Policy:  Patients having surgery or a procedure may have two visitors.  Children under the age of 61 must have an adult with them who is not the patient.  Inpatient Visitation:    Visiting hours are 7 a.m. to 8 p.m. Up to four visitors are allowed at one time in a patient room. The visitors may rotate out with other people during the day.  One visitor age 76 or older may stay with the patient overnight and must be in the room by 8 p.m.

## 2023-09-19 ENCOUNTER — Other Ambulatory Visit: Payer: Self-pay

## 2023-09-19 ENCOUNTER — Ambulatory Visit
Admission: RE | Admit: 2023-09-19 | Discharge: 2023-09-19 | Disposition: A | Discharge status: Home / Self Care | Attending: Obstetrics and Gynecology | Admitting: Obstetrics and Gynecology

## 2023-09-19 ENCOUNTER — Encounter: Payer: Self-pay | Admitting: Obstetrics and Gynecology

## 2023-09-19 ENCOUNTER — Encounter
Admission: RE | Disposition: A | Discharge status: Home / Self Care | Payer: Self-pay | Source: Home / Self Care | Attending: Obstetrics and Gynecology

## 2023-09-19 ENCOUNTER — Ambulatory Visit: Admitting: Anesthesiology

## 2023-09-19 DIAGNOSIS — N840 Polyp of corpus uteri: Secondary | ICD-10-CM | POA: Diagnosis not present

## 2023-09-19 DIAGNOSIS — Z79810 Long term (current) use of selective estrogen receptor modulators (SERMs): Secondary | ICD-10-CM | POA: Insufficient documentation

## 2023-09-19 DIAGNOSIS — D259 Leiomyoma of uterus, unspecified: Secondary | ICD-10-CM | POA: Diagnosis not present

## 2023-09-19 DIAGNOSIS — Z1721 Progesterone receptor positive status: Secondary | ICD-10-CM | POA: Insufficient documentation

## 2023-09-19 DIAGNOSIS — C50812 Malignant neoplasm of overlapping sites of left female breast: Secondary | ICD-10-CM | POA: Diagnosis present

## 2023-09-19 DIAGNOSIS — C50912 Malignant neoplasm of unspecified site of left female breast: Secondary | ICD-10-CM | POA: Diagnosis present

## 2023-09-19 DIAGNOSIS — Z170 Estrogen receptor positive status [ER+]: Secondary | ICD-10-CM

## 2023-09-19 DIAGNOSIS — Z01812 Encounter for preprocedural laboratory examination: Secondary | ICD-10-CM

## 2023-09-19 HISTORY — PX: HYSTEROSCOPY WITH D & C: SHX1775

## 2023-09-19 LAB — POCT PREGNANCY, URINE: Preg Test, Ur: NEGATIVE

## 2023-09-19 SURGERY — DILATATION AND CURETTAGE /HYSTEROSCOPY
Anesthesia: General

## 2023-09-19 MED ORDER — PROPOFOL 1000 MG/100ML IV EMUL
INTRAVENOUS | Status: AC
  Filled 2023-09-19: qty 100

## 2023-09-19 MED ORDER — LACTATED RINGERS IV SOLN
INTRAVENOUS | Status: DC
Start: 2023-09-19 — End: 2023-09-19

## 2023-09-19 MED ORDER — FENTANYL CITRATE (PF) 100 MCG/2ML IJ SOLN: 25.0000 ug | INTRAMUSCULAR | Status: DC | PRN

## 2023-09-19 MED ORDER — ORAL CARE MOUTH RINSE: 15.0000 mL | Freq: Once | OROMUCOSAL | Status: AC

## 2023-09-19 MED ORDER — OXYCODONE HCL 5 MG PO TABS: 5.0000 mg | ORAL_TABLET | Freq: Once | ORAL | Status: DC | PRN

## 2023-09-19 MED ORDER — ONDANSETRON HCL 4 MG/2ML IJ SOLN
INTRAMUSCULAR | Status: AC
  Filled 2023-09-19: qty 2

## 2023-09-19 MED ORDER — ACETAMINOPHEN 10 MG/ML IV SOLN: 1000.0000 mg | Freq: Once | INTRAVENOUS | Status: DC | PRN

## 2023-09-19 MED ORDER — MIDAZOLAM HCL 2 MG/2ML IJ SOLN
INTRAMUSCULAR | Status: AC
  Filled 2023-09-19: qty 2

## 2023-09-19 MED ORDER — ONDANSETRON HCL 4 MG/2ML IJ SOLN
INTRAMUSCULAR | Status: DC | PRN
  Administered 2023-09-19: 4 mg via INTRAVENOUS

## 2023-09-19 MED ORDER — 0.9 % SODIUM CHLORIDE (POUR BTL) OPTIME
TOPICAL | Status: DC | PRN
  Administered 2023-09-19: 500 mL

## 2023-09-19 MED ORDER — LACTATED RINGERS IV SOLN: INTRAVENOUS | Status: DC

## 2023-09-19 MED ORDER — LIDOCAINE HCL (CARDIAC) PF 100 MG/5ML IV SOSY
PREFILLED_SYRINGE | INTRAVENOUS | Status: DC | PRN
  Administered 2023-09-19: 40 mg via INTRAVENOUS

## 2023-09-19 MED ORDER — CHLORHEXIDINE GLUCONATE 0.12 % MT SOLN
15.0000 mL | Freq: Once | OROMUCOSAL | Status: AC
  Administered 2023-09-19: 15 mL via OROMUCOSAL

## 2023-09-19 MED ORDER — DEXMEDETOMIDINE HCL IN NACL 80 MCG/20ML IV SOLN
INTRAVENOUS | Status: DC | PRN
  Administered 2023-09-19: 8 ug via INTRAVENOUS

## 2023-09-19 MED ORDER — ACETAMINOPHEN 500 MG PO TABS
ORAL_TABLET | ORAL | Status: AC
  Filled 2023-09-19: qty 2

## 2023-09-19 MED ORDER — ACETAMINOPHEN 500 MG PO TABS
1000.0000 mg | ORAL_TABLET | Freq: Once | ORAL | Status: AC
  Administered 2023-09-19: 1000 mg via ORAL

## 2023-09-19 MED ORDER — DEXAMETHASONE SODIUM PHOSPHATE 10 MG/ML IJ SOLN
INTRAMUSCULAR | Status: DC | PRN
  Administered 2023-09-19: 8 mg via INTRAVENOUS

## 2023-09-19 MED ORDER — PROPOFOL 10 MG/ML IV BOLUS
INTRAVENOUS | Status: DC | PRN
  Administered 2023-09-19: 200 mg via INTRAVENOUS
  Administered 2023-09-19: 150 ug/kg/min via INTRAVENOUS

## 2023-09-19 MED ORDER — OXYCODONE HCL 5 MG/5ML PO SOLN: 5.0000 mg | Freq: Once | ORAL | Status: DC | PRN

## 2023-09-19 MED ORDER — CHLORHEXIDINE GLUCONATE 0.12 % MT SOLN
OROMUCOSAL | Status: AC
  Filled 2023-09-19: qty 15

## 2023-09-19 MED ORDER — FENTANYL CITRATE (PF) 100 MCG/2ML IJ SOLN
INTRAMUSCULAR | Status: DC | PRN
  Administered 2023-09-19 (×2): 50 ug via INTRAVENOUS

## 2023-09-19 MED ORDER — SILVER NITRATE-POT NITRATE 75-25 % EX MISC
CUTANEOUS | Status: DC | PRN
  Administered 2023-09-19: 8

## 2023-09-19 MED ORDER — DROPERIDOL 2.5 MG/ML IJ SOLN: 0.6250 mg | Freq: Once | INTRAMUSCULAR | Status: DC | PRN

## 2023-09-19 MED ORDER — MIDAZOLAM HCL 5 MG/5ML IJ SOLN
INTRAMUSCULAR | Status: DC | PRN
  Administered 2023-09-19: 2 mg via INTRAVENOUS

## 2023-09-19 MED ORDER — FENTANYL CITRATE (PF) 100 MCG/2ML IJ SOLN
INTRAMUSCULAR | Status: AC
  Filled 2023-09-19: qty 2

## 2023-09-19 SURGICAL SUPPLY — 23 items
BAG URINE DRAIN 2000ML AR STRL (UROLOGICAL SUPPLIES) IMPLANT
CATH URTH 16FR FL 2W BLN LF (CATHETERS) IMPLANT
DEVICE MYOSURE LITE (MISCELLANEOUS) IMPLANT
DEVICE MYOSURE REACH (MISCELLANEOUS) IMPLANT
DRSG TELFA 3X8 NADH STRL (GAUZE/BANDAGES/DRESSINGS) IMPLANT
ELECTRODE REM PT RTRN 9FT ADLT (ELECTROSURGICAL) ×1 IMPLANT
GLOVE BIO SURGEON STRL SZ7 (GLOVE) ×1 IMPLANT
GLOVE BIOGEL PI IND STRL 7.5 (GLOVE) ×1 IMPLANT
GOWN STRL REUS W/ TWL LRG LVL3 (GOWN DISPOSABLE) ×2 IMPLANT
KIT PROCEDURE FLUENT (KITS) ×1 IMPLANT
KIT TURNOVER CYSTO (KITS) ×1 IMPLANT
MANIFOLD NEPTUNE II (INSTRUMENTS) ×1 IMPLANT
PACK DNC HYST (MISCELLANEOUS) ×1 IMPLANT
PAD OB MATERNITY 11 LF (PERSONAL CARE ITEMS) ×1 IMPLANT
PAD PREP OB/GYN DISP 24X41 (PERSONAL CARE ITEMS) ×1 IMPLANT
SCRUB CHG 4% DYNA-HEX 4OZ (MISCELLANEOUS) ×1 IMPLANT
SEAL ROD LENS SCOPE MYOSURE (ABLATOR) ×1 IMPLANT
SET CYSTO W/LG BORE CLAMP LF (SET/KITS/TRAYS/PACK) IMPLANT
SOL .9 NS 3000ML IRR UROMATIC (IV SOLUTION) ×1 IMPLANT
SURGILUBE 2OZ TUBE FLIPTOP (MISCELLANEOUS) ×1 IMPLANT
TRAP FLUID SMOKE EVACUATOR (MISCELLANEOUS) ×1 IMPLANT
TUBING CONNECTING 10 (TUBING) ×1 IMPLANT
WATER STERILE IRR 500ML POUR (IV SOLUTION) ×1 IMPLANT

## 2023-09-19 NOTE — Interval H&P Note (Signed)
 History and Physical Interval Note:  09/19/2023 2:46 PM  Madison Sullivan  has presented today for surgery, with the diagnosis of endometrial polyp.  The various methods of treatment have been discussed with the patient and family. After consideration of risks, benefits and other options for treatment, the patient has consented to  Procedure(s) with comments: DILATATION AND CURETTAGE /HYSTEROSCOPY (N/A) - ENDOMETRIAL POLYPECTOMY as a surgical intervention.  The patient's history has been reviewed, patient examined, no change in status, stable for surgery.  I have reviewed the patient's chart and labs.  Questions were answered to the patient's satisfaction.    Jeani Mill, MD, [facility] 09/19/2023 2:46 PM

## 2023-09-19 NOTE — Anesthesia Preprocedure Evaluation (Addendum)
 Anesthesia Evaluation  Patient identified by MRN, date of birth, ID band Patient awake    Reviewed: Allergy & Precautions, H&P , NPO status , Patient's Chart, lab work & pertinent test results  History of Anesthesia Complications (+) PONV and history of anesthetic complications  Airway Mallampati: II  TM Distance: >3 FB Neck ROM: full    Dental no notable dental hx.    Pulmonary asthma    Pulmonary exam normal        Cardiovascular negative cardio ROS Normal cardiovascular exam     Neuro/Psych negative neurological ROS  negative psych ROS   GI/Hepatic negative GI ROS, Neg liver ROS,,,  Endo/Other  negative endocrine ROS    Renal/GU negative Renal ROS  negative genitourinary   Musculoskeletal   Abdominal Normal abdominal exam  (+)   Peds  Hematology negative hematology ROS (+)   Anesthesia Other Findings Past Medical History: No date: Anemia No date: Asthma No date: Cancer (HCC) 04/25/2022: Dysplastic nevus     Comment:  R med breast - mild 04/25/2022: Dysplastic nevus     Comment:  right mid lateral tricep - moderate No date: Endometrial polyp     Comment:  2025 No date: Family history of breast cancer No date: Fibroids     Comment:  uterine No date: Headache No date: History of migraine headaches     Comment:  Abstract from medicat No date: Personal history of radiation therapy No date: Pneumonia No date: PONV (postoperative nausea and vomiting) 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  Past Surgical History: 04/16/2021: ADJACENT TISSUE TRANSFER/TISSUE REARRANGEMENT; Left     Comment:  Procedure: ADJACENT TISSUE TRANSFER/TISSUE               REARRANGEMENT;  Surgeon: Marshall Skeeter, MD;                Location: [facility] ORS;  Service: General;  Laterality: Left; 04/16/2021: BREAST LUMPECTOMY; Left 04/16/2021: BREAST LUMPECTOMY WITH SENTINEL LYMPH NODE BIOPSY; Left     Comment:  Procedure: BREAST LUMPECTOMY WITH  SENTINEL LYMPH NODE               BX;  Surgeon: Marshall Skeeter, MD;  Location: [facility]               ORS;  Service: General;  Laterality: Left; 1998: NASAL SINUS SURGERY; Bilateral     Comment:  corrected nasal septum in [place] 2005: UTERINE FIBROID SURGERY     Comment:  in [place],horrible blood loss; not cancerous, "just a               growth" some  being watched by OB/GYN.  BMI    Body Mass Index: 22.46 kg/m      Reproductive/Obstetrics negative OB ROS                             Anesthesia Physical Anesthesia Plan  ASA: 2  Anesthesia Plan: General   Post-op Pain Management: Tylenol  PO (pre-op)* and Toradol  IV (intra-op)*   Induction: Intravenous  PONV Risk Score and Plan: 4 or greater and Propofol  infusion, Dexamethasone , Ondansetron , Midazolam  and TIVA  Airway Management Planned: LMA  Additional Equipment:   Intra-op Plan:   Post-operative Plan:   Informed Consent: I have reviewed the patients History and Physical, chart, labs and discussed the procedure including the risks, benefits and alternatives for the proposed anesthesia with the patient or authorized representative who has indicated his/her understanding  and acceptance.     Dental Advisory Given  Plan Discussed with: CRNA and Surgeon  Anesthesia Plan Comments:         Anesthesia Quick Evaluation

## 2023-09-19 NOTE — Transfer of Care (Signed)
 Immediate Anesthesia Transfer of Care Note  Patient: Madison Sullivan  Procedure(s) Performed: DILATATION AND CURETTAGE /HYSTEROSCOPY  Patient Location: PACU  Anesthesia Type:General  Level of Consciousness: awake, alert , oriented, and patient cooperative  Airway & Oxygen Therapy: Patient Spontanous Breathing and Patient connected to face mask oxygen  Post-op Assessment: Report given to RN, Post -op Vital signs reviewed and stable, and Patient moving all extremities X 4  Post vital signs: Reviewed and stable  Last Vitals:  Vitals Value Taken Time  BP 138/94 09/19/23 1619  Temp    Pulse 76 09/19/23 1624  Resp 15 09/19/23 1624  SpO2 97 % 09/19/23 1624  Vitals shown include unfiled device data.  Last Pain:  Vitals:   09/19/23 1310  TempSrc: Temporal  PainSc: 0-No pain       Pt transported to PACU with circ RN and report given to PACU RN. Pt awake, responsive and comfortable. Denies any n/v at this time. VSS.  Complications: No notable events documented.

## 2023-09-19 NOTE — Anesthesia Procedure Notes (Signed)
 Procedure Name: LMA Insertion Date/Time: 09/19/2023 3:20 PM  Performed by: Sherrlyn Dolores, CRNAPre-anesthesia Checklist: Patient identified, Patient being monitored, Timeout performed, Emergency Drugs available and Suction available Patient Re-evaluated:Patient Re-evaluated prior to induction Oxygen Delivery Method: Circle system utilized Preoxygenation: Pre-oxygenation with 100% oxygen Induction Type: IV induction Ventilation: Mask ventilation without difficulty LMA: LMA inserted LMA Size: 4.0 Tube type: Oral Number of attempts: 1 Placement Confirmation: positive ETCO2 and breath sounds checked- equal and bilateral Tube secured with: Tape Dental Injury: Teeth and Oropharynx as per pre-operative assessment  Comments: LMA placement x1 attempt. Atraumatic. Uneventful.

## 2023-09-19 NOTE — Op Note (Signed)
 Operative Note   Name: Madison Sullivan  Date of Service: 09/19/2023  DOB: 04-13-1981  MRN: [id number]   PRE-OP DIAGNOSIS:  1) Endometrial polypoid lesion 2) Fibroid uterus 3) Malignant neoplasm of overlapping sites of left breast in female, estrogen receptor positive   POST-OP DIAGNOSIS:  1) Endometrial polypoid lesion 2) Fibroid uterus 3) Malignant neoplasm of overlapping sites of left breast in female, estrogen receptor positive   SURGEON: Surgeons and Role:    Kris Pester, MD - Primary  PROCEDURE: Procedure(s): DILATATION AND CURETTAGE /HYSTEROSCOPY, ENDOMETRIAL POLYPECTOMY  ANESTHESIA: MAC  ESTIMATED BLOOD LOSS: 10 mL  DRAINS: none   TOTAL IV FLUIDS: 400 mL  SPECIMENS:  Endometrial polypoid lesion and curettings  VTE PROPHYLAXIS: SCDs to the bilateral lower extremities  ANTIBIOTICS: non indicated  FLUID DEFICIT: 300 mL  COMPLICATIONS: none  DISPOSITION: PACU - hemodynamically stable.  CONDITION: stable  INDICATION: 43 y.o. G0P0000 female who has a history of breast cancer that is ER/PR positive and diagnosed in 03/2021. She takes Tamoxifen  for this. She has been monitored for slow-growing fibroids. On her last ultrasound she was noted to have a polypoid lesion in her uterus.  This was confirmed on ultrasound by a saline sonohysterogram.   FINDINGS: Exam under anesthesia revealed small, mobile anteverted uterus with no masses and bilateral adnexa without masses or fullness. Hysteroscopy revealed a a shaggy endometrium and a polypoid lesion within the uterus (antero-laterally), otherwise grossly normal appearing uterine cavity with bilateral tubal ostia and normal appearing endocervical canal. After curetting (with Myosure with direct visualization) string-like tissue found below this surface of undetermined significance.    PROCEDURE IN DETAIL:  After informed consent was obtained, the patient was taken to the operating room where anesthesia was obtained without  difficulty. The patient was positioned in the dorsal lithotomy position in Dumas stirrups.  The patient's bladder was catheterized with an in and out foley catheter.  The patient was examined under anesthesia, with the above noted findings.  The bi-valved speculum was placed inside the patient's vagina, and the the anterior lip of the cervix was grasped with the tenaculum.  Then the cervix was progressively dilated to a 7 mm Hegar dilator.  The hysteroscope was introduced, with the above noted findings. The hystersocope was utilized to remove the polypoid lesion and to sample as much of the lining as possible.  The pressure in the uterus was gradually lowered with hemostasis noted.  The intrauterine pressure was always below the MAP.    The hysteroscope was removed and the tenaculum was removed from the cervix. Hemostasis obtained using silver nitrate.  Vagina verified to be free of any sponges or instruments.  Speculum removed.  Digital vaginal sweep done to ensure no remaining sponges or instruments.   She was then taken out of dorsal lithotomy. The patient tolerated the procedure well.  Sponge, lap and needle counts were correct x2.  The patient was taken to recovery room in excellent condition.  Kris Pester, MD, FACOG 09/19/2023 4:09 PM

## 2023-09-22 ENCOUNTER — Other Ambulatory Visit

## 2023-09-22 ENCOUNTER — Encounter: Payer: Self-pay | Admitting: Obstetrics and Gynecology

## 2023-09-22 LAB — SURGICAL PATHOLOGY

## 2023-09-24 ENCOUNTER — Other Ambulatory Visit

## 2023-09-27 NOTE — Anesthesia Postprocedure Evaluation (Signed)
 Anesthesia Post Note  Patient: Madison Sullivan  Procedure(s) Performed: DILATATION AND CURETTAGE /HYSTEROSCOPY  Patient location during evaluation: PACU Anesthesia Type: General Level of consciousness: awake and alert Pain management: pain level controlled Vital Signs Assessment: post-procedure vital signs reviewed and stable Respiratory status: spontaneous breathing, nonlabored ventilation, respiratory function stable and patient connected to nasal cannula oxygen Cardiovascular status: blood pressure returned to baseline and stable Postop Assessment: no apparent nausea or vomiting Anesthetic complications: no   No notable events documented.   Last Vitals:  Vitals:   09/19/23 1630 09/19/23 1645  BP:  (!) 154/90  Pulse: 79 70  Resp: (!) 26 18  Temp:  (!) 36.2 C  SpO2: 98% 98%    Last Pain:  Vitals:   09/19/23 1645  TempSrc: Temporal  PainSc: 0-No pain                 Vanice Genre

## 2023-10-06 ENCOUNTER — Encounter: Payer: Self-pay | Admitting: Obstetrics and Gynecology

## 2023-10-07 ENCOUNTER — Telehealth: Payer: Self-pay

## 2023-10-07 NOTE — Telephone Encounter (Signed)
 Prior Authorization submitted on Cover My Meds for Tamoxifen  Citrate 20mg  tablet.  Clinical Key: [id number].  Will check back on status shortly.

## 2023-10-09 NOTE — Telephone Encounter (Signed)
 Checked status of Prior Auth on Cover My Meds "[employer] is reviewing your PA request. Typically an electronic response will be received within 24-72 hours."  Will check again tomorrow.

## 2023-10-15 NOTE — Telephone Encounter (Signed)
 Approval scanned into chart. No further follow up needed.

## 2024-01-20 ENCOUNTER — Ambulatory Visit
Admission: RE | Admit: 2024-01-20 | Discharge: 2024-01-20 | Disposition: A | Discharge status: Home / Self Care | Source: Ambulatory Visit | Attending: Oncology | Admitting: Oncology

## 2024-01-20 ENCOUNTER — Ambulatory Visit
Admission: RE | Admit: 2024-01-20 | Discharge: 2024-01-20 | Disposition: A | Discharge status: Home / Self Care | Source: Ambulatory Visit | Attending: Oncology

## 2024-01-20 DIAGNOSIS — Z08 Encounter for follow-up examination after completed treatment for malignant neoplasm: Secondary | ICD-10-CM | POA: Diagnosis present

## 2024-01-20 DIAGNOSIS — Z853 Personal history of malignant neoplasm of breast: Secondary | ICD-10-CM | POA: Insufficient documentation

## 2024-02-24 ENCOUNTER — Encounter: Payer: Self-pay | Admitting: Oncology

## 2024-02-24 ENCOUNTER — Inpatient Hospital Stay: Attending: Oncology | Admitting: Oncology

## 2024-02-24 VITALS — BP 123/98 | HR 86 | Temp 97.9°F | Resp 19 | Ht 69.0 in | Wt 154.7 lb

## 2024-02-24 DIAGNOSIS — Z1732 Human epidermal growth factor receptor 2 negative status: Secondary | ICD-10-CM | POA: Insufficient documentation

## 2024-02-24 DIAGNOSIS — C50212 Malignant neoplasm of upper-inner quadrant of left female breast: Secondary | ICD-10-CM | POA: Diagnosis present

## 2024-02-24 DIAGNOSIS — Z5181 Encounter for therapeutic drug level monitoring: Secondary | ICD-10-CM | POA: Diagnosis not present

## 2024-02-24 DIAGNOSIS — Z808 Family history of malignant neoplasm of other organs or systems: Secondary | ICD-10-CM | POA: Diagnosis not present

## 2024-02-24 DIAGNOSIS — N6001 Solitary cyst of right breast: Secondary | ICD-10-CM | POA: Diagnosis not present

## 2024-02-24 DIAGNOSIS — D259 Leiomyoma of uterus, unspecified: Secondary | ICD-10-CM | POA: Diagnosis not present

## 2024-02-24 DIAGNOSIS — Z803 Family history of malignant neoplasm of breast: Secondary | ICD-10-CM | POA: Insufficient documentation

## 2024-02-24 DIAGNOSIS — Z79810 Long term (current) use of selective estrogen receptor modulators (SERMs): Secondary | ICD-10-CM | POA: Insufficient documentation

## 2024-02-24 DIAGNOSIS — Z170 Estrogen receptor positive status [ER+]: Secondary | ICD-10-CM | POA: Insufficient documentation

## 2024-02-24 DIAGNOSIS — Z79899 Other long term (current) drug therapy: Secondary | ICD-10-CM | POA: Diagnosis not present

## 2024-02-24 DIAGNOSIS — Z923 Personal history of irradiation: Secondary | ICD-10-CM | POA: Diagnosis not present

## 2024-02-24 DIAGNOSIS — Z08 Encounter for follow-up examination after completed treatment for malignant neoplasm: Secondary | ICD-10-CM

## 2024-02-24 DIAGNOSIS — Z1721 Progesterone receptor positive status: Secondary | ICD-10-CM | POA: Insufficient documentation

## 2024-02-24 DIAGNOSIS — C50912 Malignant neoplasm of unspecified site of left female breast: Secondary | ICD-10-CM

## 2024-02-24 NOTE — Progress Notes (Signed)
 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  Diagnostic bilateral mammogram in October 2022 showed hypoechoic mass in the 11 o'clock position in the left breast measuring 1.7 x 1.3 x 1.7 cm.  Several left axillary lymph nodes prominent to mildly thickened cortices.  These are likely reactive from recent COVID-vaccine..  Left breast biopsy of the mass showed invasive mammary carcinoma grade 3 ER greater than 90% positive PR greater than 90% positive and HER2 negative.  Patient met with Dr. Dessa who also biopsied one of the concerning lymph nodes in the mammogram which was negative for malignancy.  Final pathology showed a 3.5 cm grade 3 invasive mammary carcinoma with negative margins.12 lymph nodes were negative for malignancy.  Oncotype score came back at 14 and therefore patient did not require any adjuvant chemotherapy.  Patient completed adjuvant radiation therapy.  Both ovarian suppression plus AI versus tamoxifen  was discussed with the patient.  Patient is currently on tamoxifen  which she started taking in February 2023. She does get regular menstrual cycles on tamoxifen    Interval history-  Madison Sullivan is a 43 year old female with breast cancer on tamoxifen . She has been on tamoxifen  for breast cancer for nearly three years.  She follows up with GYN for her fibroids which have slowly grown while she has been on tamoxifen .She experiences pain associated with them. Regular ultrasounds are being conducted to monitor the fibroids, with the next one scheduled for November.  She continues to take tamoxifen  for her breast cancer and experiences side effects, including lower energy levels and joint pain, particularly in her feet and ankles. Despite these challenges, she maintains an exercise regimen.   ECOG PS- 0 Pain scale- 0  Review of systems- Review of Systems  Constitutional:  Positive for malaise/fatigue. Negative for chills, fever and weight loss.  HENT:  Negative for congestion, ear discharge and nosebleeds.   Eyes:  Negative for blurred vision.  Respiratory:  Negative for cough, hemoptysis, sputum production, shortness of breath and wheezing.   Cardiovascular:  Negative for chest pain, palpitations, orthopnea and claudication.  Gastrointestinal:  Negative for abdominal pain, blood in stool, constipation, diarrhea, heartburn, melena, nausea and vomiting.  Genitourinary:  Negative for dysuria, flank pain, frequency, hematuria and urgency.  Musculoskeletal:  Positive for joint pain. Negative for back pain and myalgias.  Skin:  Negative for rash.  Neurological:  Negative for dizziness, tingling, focal weakness, seizures, weakness and headaches.  Endo/Heme/Allergies:  Does not bruise/bleed easily.  Psychiatric/Behavioral:  Negative for depression and suicidal ideas. The patient does not have insomnia.       Allergies  Allergen Reactions   Aspirin Shortness Of Breath    Avoids it due to her asthma   Iodine Rash     Past  Medical History:  Diagnosis Date   Anemia    Asthma    Cancer (HCC)    Dysplastic nevus 04/25/2022   R med breast - mild   Dysplastic nevus 04/25/2022    right mid lateral tricep - moderate   Endometrial polyp    2025   Family history of breast cancer    Fibroids    uterine   Headache    History of migraine headaches    Abstract from medicat   Personal history of radiation therapy    Pneumonia    PONV (postoperative nausea and vomiting)    Uterine myoma      Past Surgical History:  Procedure Laterality Date   ADJACENT TISSUE TRANSFER/TISSUE REARRANGEMENT Left 04/16/2021   Procedure: ADJACENT TISSUE TRANSFER/TISSUE REARRANGEMENT;  Surgeon: Madison Reyes ORN, MD;  Location: [facility] ORS;  Service: General;  Laterality: Left;   BREAST LUMPECTOMY Left 04/16/2021   BREAST LUMPECTOMY WITH SENTINEL LYMPH NODE BIOPSY Left 04/16/2021   Procedure: BREAST LUMPECTOMY WITH SENTINEL LYMPH NODE BX;  Surgeon: Madison Reyes ORN, MD;  Location: [facility] ORS;  Service: General;  Laterality: Left;   HYSTEROSCOPY WITH D & C N/A 09/19/2023   Procedure: DILATATION AND CURETTAGE /HYSTEROSCOPY;  Surgeon: Madison Garnette BIRCH, MD;  Location: [facility] ORS;  Service: Gynecology;  Laterality: N/A;  ENDOMETRIAL POLYPECTOMY   NASAL SINUS SURGERY Bilateral 1998   corrected nasal septum in [place]   UTERINE FIBROID SURGERY  2005   in [place],horrible blood loss; not cancerous, just a growth some  being watched by OB/GYN.    Social History   Socioeconomic History   Marital status: Single    Spouse name: Not on file   Number of children: Not on file   Years of education: 18   Highest education level: Not on file  Occupational History   Occupation: professor  Tobacco Use   Smoking status: Never   Smokeless tobacco: Never  Vaping Use   Vaping status: Never Used  Substance and Sexual Activity   Alcohol use: Yes    Alcohol/week: 1.0 standard drink of alcohol    Types: 1 Glasses of wine per week    Comment: less than three mos   Drug use: No   Sexual activity: Not Currently    Birth control/protection: Inserts  Other Topics Concern   Not on file  Social  History Narrative   Lives alone   Social Drivers of Health   Financial Resource Strain: Low Risk  (01/12/2024)   Received from [facility]   Overall Financial Resource Strain (CARDIA)    Difficulty of Paying Living Expenses: Not hard at all  Food Insecurity: No Food Insecurity (01/12/2024)   Received from [facility]   Hunger Vital Sign    Within the past 12 months, you worried that your food would run out before you got the money to buy more.: Never true    Within the past 12 months, the food you bought just didn't last and you didn't have money to get more.: Never true  Transportation Needs: No Transportation Needs (01/12/2024)   Received from [facility] - Transportation    In the past 12 months, has lack of transportation kept you from medical appointments or from getting medications?: No    Lack of Transportation (Non-Medical): No  Physical Activity: Not on file  Stress: Not on file  Social Connections: Not on file  Intimate Partner Violence: Not on file  Family History  Problem Relation Age of Onset   Endometriosis Mother    Hypertension Mother    Depression Mother    Migraines Mother    Cancer Mother        brain tumors   Skin cancer Father    Hypertension Father    Crohn's disease Father    Asthma Father    Breast cancer Paternal Grandmother    Endometriosis Sister    Depression Sister    Asthma Brother    Hip fracture Paternal Grandfather      Current Outpatient Medications:    albuterol (PROVENTIL HFA;VENTOLIN HFA) 108 (90 Base) MCG/ACT inhaler, Inhale 2 puffs into the lungs every 6 (six) hours as needed for wheezing or shortness of breath., Disp: , Rfl:    baclofen (LIORESAL) 10 MG tablet, Take 10 mg by mouth as needed for muscle spasms., Disp: , Rfl:    celecoxib (CELEBREX) 200 MG capsule, Take 200 mg by mouth daily as needed for mild pain (pain score 1-3) or moderate pain (pain score 4-6).,  Disp: , Rfl:    fluticasone  (FLONASE) 50 MCG/ACT nasal spray, Place 1 spray into both nostrils daily as needed for allergies., Disp: , Rfl:    loratadine (CLARITIN) 10 MG tablet, Take 10 mg by mouth daily., Disp: , Rfl:    MAGNESIUM GLYCINATE PO, Take 500 mg by mouth daily., Disp: , Rfl:    Multiple Vitamins-Minerals (MULTIVITAMIN ADULT) CHEW, Chew 1 each by mouth daily., Disp: , Rfl:    promethazine  (PHENERGAN ) 25 MG tablet, Take 25 mg by mouth every 6 (six) hours as needed for nausea or vomiting., Disp: , Rfl:    rizatriptan (MAXALT-MLT) 10 MG disintegrating tablet, Take 10 mg by mouth daily as needed for migraine., Disp: , Rfl:    sodium chloride  (OCEAN) 0.65 % SOLN nasal spray, Place 1 spray into both nostrils as needed for congestion., Disp: 1 Bottle, Rfl: 6   tamoxifen  (NOLVADEX ) 20 MG tablet, TAKE 1 TABLET(20 MG) BY MOUTH DAILY, Disp: 90 tablet, Rfl: 3   XDEMVY 0.25 % SOLN, Place 1 drop into both eyes 2 (two) times daily., Disp: , Rfl:   Physical exam:  Vitals:   02/24/24 1015  BP: (!) 123/98  Pulse: 86  Resp: 19  Temp: 97.9 F (36.6 C)  TempSrc: Tympanic  SpO2: 99%  Weight: 154 lb 11.2 oz (70.2 kg)  Height: 5' 9 (1.753 m)   Physical Exam Cardiovascular:     Rate and Rhythm: Normal rate and regular rhythm.     Heart sounds: Normal heart sounds.  Pulmonary:     Effort: Pulmonary effort is normal.     Breath sounds: Normal breath sounds.  Abdominal:     General: Bowel sounds are normal.     Palpations: Abdomen is soft.  Skin:    General: Skin is warm and dry.  Neurological:     Mental Status: She is alert and oriented to person, place, and time.    Breast exam was performed in seated and lying down position. Patient is status post right lumpectomy with a well-healed surgical scar. No evidence of any palpable masses. No evidence of axillary adenopathy. No evidence of any palpable masses or lumps in the left breast. No evidence of leftt axillary adenopathy   I have  personally reviewed labs listed below:    Latest Ref Rng & Units 02/20/2022    7:49 AM  CMP  Glucose 70 - 99 mg/dL 97   BUN 6 -  24 mg/dL 16   Creatinine 9.42 - 1.00 mg/dL 9.09   Sodium 865 - 855 mmol/L 145   Potassium 3.5 - 5.2 mmol/L 4.5   Chloride 96 - 106 mmol/L 108   Calcium 8.7 - 10.2 mg/dL 9.3   Total Protein 6.0 - 8.5 g/dL 6.9   Total Bilirubin 0.0 - 1.2 mg/dL 0.2   Alkaline Phos 44 - 121 IU/L 107   AST 0 - 40 IU/L 16   ALT 0 - 32 IU/L 15       Latest Ref Rng & Units 02/20/2022    7:49 AM  CBC  WBC 3.4 - 10.8 x10E3/uL 5.2   Hemoglobin 11.1 - 15.9 g/dL 87.0   Hematocrit 65.9 - 46.6 % 38.9   Platelets 150 - 450 x10E3/uL 253     Assessment and plan- Patient is a 43 y.o. female  With history of stage I left breast cancer T2 N0 M0 ER/PR positive HER2 negative s/p lumpectomy adjuvant radiation therapy and presently on tamoxifen .  She is here for a routine surveillance visit for breast cancer    Patient's mammogram from September 2025 was unremarkable.  She has benign right breast cysts.  I will plan to get MRI bilateral breasts in March 2026.  Also discussed the results of bone density scan which was normal.  Clinically patient is doing well with no concerning signs and symptoms of recurrence based on today's exam.  She will continue with tamoxifen  for endocrine therapy ideally for 10 years.  Benign right breast cysts Benign right breast cysts stable, asymptomatic, and well-managed. - Continue routine monitoring with mammograms.  Uterine fibroids: She follows up with GYN for this and may start Orilissa after her November visit.  There is no overt contraindication with using Orilissa along with tamoxifen  and no known drug interactions.  I will see her back in 6 months no labs   Visit Diagnosis 1. Encounter for follow-up surveillance of breast cancer   2. Encounter for monitoring tamoxifen  therapy      Dr. Annah Skene, MD, MPH [facility] [phone] 02/24/2024 10:06 AM

## 2024-02-24 NOTE — Progress Notes (Signed)
 Patient would like to discuss bone density results and a few other things.

## 2024-04-12 ENCOUNTER — Ambulatory Visit (INDEPENDENT_AMBULATORY_CARE_PROVIDER_SITE_OTHER): Payer: Self-pay | Admitting: Medical

## 2024-04-12 ENCOUNTER — Encounter: Payer: Self-pay | Admitting: Medical

## 2024-04-12 ENCOUNTER — Other Ambulatory Visit: Payer: Self-pay

## 2024-04-12 VITALS — BP 122/78 | HR 82 | Temp 99.1°F

## 2024-04-12 DIAGNOSIS — J069 Acute upper respiratory infection, unspecified: Secondary | ICD-10-CM

## 2024-04-12 DIAGNOSIS — J4521 Mild intermittent asthma with (acute) exacerbation: Secondary | ICD-10-CM

## 2024-04-12 MED ORDER — PREDNISONE 10 MG PO TABS: ORAL_TABLET | ORAL | 0 refills | Status: AC

## 2024-04-12 NOTE — Progress Notes (Signed)
 [facility] [address] [place], [place] [address]   Office Visit Note  Patient Name: Madison Sullivan Date of Birth [id number]  Medical Record number [id number]  Date of Service: 04/12/2024  Chief Complaint  Patient presents with   Acute Visit    States that she typically get a sinus infection around the fall, states that she started on Friday with a sore throat and ears popping, over the weekend the congestion started, she is astmatic and the green mucus started.  She started taking celebrex for pain releif over the weekend, states that she may have post nasal drip, low suspicion for covid/flu/strep     HPI Discussed the use of AI scribe software for clinical note transcription with the patient, who gave verbal consent to proceed.  History of Present Illness Madison Sullivan is a 43 year old female with a history of asthma, sinus and bronchitis infections who presents with symptoms of a respiratory infection.  Respiratory symptoms - Sx initially began about 3-4 days ago with mild throat irritation. Throat irritation has continued and has developed cough and head congestion. - This morning, noted slight chest tightness. Cough also some productive of greenish mucus - Deep cough, more pronounced when lying down or upon waking - No major breathing difficulties. No wheezing. - No fever, chills, or sweats - No runny nose - Postnasal drip present - Throat slightly sore, managed with teas  - Ears popping some, no ear pain  Asthma and allergy management - History of asthma, attributes deep cough to asthma - Using Flonase nasal spray daily for past two weeks for allergy-like symptoms - Taking montelukast  and an antihistamine for allergy management - Has not used albuterol inhaler yet, as symptoms have not escalated  Medication use - Started Celebrex yesterday for anti-inflammatory effects; States Ibuprofen  does not agree with her stomach when used in past. 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   Current Medication:  Outpatient Encounter Medications as of 04/12/2024  Medication Sig   albuterol (PROVENTIL HFA;VENTOLIN HFA) 108 (90 Base) MCG/ACT inhaler Inhale 2 puffs into the lungs every 6 (six) hours as needed for wheezing or shortness of breath.   baclofen (LIORESAL) 10 MG tablet Take 10 mg by mouth as needed for muscle spasms.   celecoxib (CELEBREX) 200 MG capsule Take 200 mg by mouth daily as needed for mild pain (pain score 1-3) or moderate pain (pain score 4-6).   fluticasone  (FLONASE) 50 MCG/ACT nasal spray Place 1 spray into both nostrils daily as needed for allergies.   loratadine (CLARITIN) 10 MG tablet Take 10 mg by mouth daily.   MAGNESIUM GLYCINATE PO Take 500 mg by mouth daily.   montelukast  (SINGULAIR ) 10 MG tablet Take 10 mg by mouth at bedtime.   Multiple Vitamins-Minerals (MULTIVITAMIN ADULT) CHEW Chew 1 each by mouth daily.   ondansetron  (ZOFRAN ) 8 MG tablet Take 8 mg by mouth.   promethazine  (PHENERGAN ) 25 MG tablet Take 25 mg by mouth every 6 (six) hours as needed for nausea or vomiting.   rizatriptan (MAXALT-MLT)  10 MG disintegrating tablet Take 10 mg by mouth daily as needed for migraine.   sodium chloride  (OCEAN) 0.65 % SOLN nasal spray Place 1 spray into both nostrils as needed for congestion.   tamoxifen  (NOLVADEX ) 20 MG tablet TAKE 1 TABLET(20 MG) BY MOUTH DAILY   [DISCONTINUED] XDEMVY 0.25 % SOLN Place 1 drop into  both eyes 2 (two) times daily.   No facility-administered encounter medications on file as of 04/12/2024.      Medical History: Past Medical History:  Diagnosis Date   Anemia    Asthma    Cancer (HCC)    Dysplastic nevus 04/25/2022   R med breast - mild   Dysplastic nevus 04/25/2022   right mid lateral tricep - moderate   Endometrial polyp    2025   Family history of breast cancer    Fibroids    uterine   Headache    History of migraine headaches    Abstract from medicat   Personal history of radiation therapy    Pneumonia    PONV (postoperative nausea and vomiting)    Uterine myoma      Vital Signs: BP 122/78   Pulse 82   Temp 99.1 F (37.3 C) (Tympanic)   SpO2 99%    Review of Systems See HPI  Physical Exam Vitals reviewed.  Constitutional:      General: She is not in acute distress.    Appearance: She is not ill-appearing.     Comments: Tired appearing  HENT:     Head: Normocephalic.     Right Ear: Ear canal and external ear normal. Tympanic membrane is not injected, erythematous or bulging.     Left Ear: Ear canal and external ear normal. Tympanic membrane is not injected, erythematous or bulging.     Ears:     Comments: TMs dull bilaterally with small serous middle ear fluid.    Nose: Mucosal edema and congestion present. No rhinorrhea.     Right Turbinates: Not swollen.     Left Turbinates: Not swollen.     Right Sinus: No maxillary sinus tenderness or frontal sinus tenderness.     Left Sinus: No maxillary sinus tenderness or frontal sinus tenderness.     Mouth/Throat:     Mouth: Mucous membranes are moist. No oral lesions.     Pharynx: Posterior oropharyngeal erythema (mild) present. No pharyngeal swelling.     Tonsils: No tonsillar exudate. 0 on the right. 0 on the left.  Cardiovascular:     Rate and Rhythm: Normal rate and regular rhythm.     Heart sounds: No murmur heard.    No friction rub. No gallop.  Pulmonary:     Effort: Pulmonary  effort is normal. Prolonged expiration (slight) present. No respiratory distress.     Breath sounds: Normal breath sounds. No decreased air movement. No wheezing, rhonchi or rales.  Musculoskeletal:     Cervical back: Neck supple. No rigidity.  Lymphadenopathy:     Cervical: Cervical adenopathy (small anterior nodes) present.  Neurological:     Mental Status: She is alert.     Assessment/Plan: 1. Acute upper respiratory infection (Primary) 2. Mild intermittent asthma with acute exacerbation  - predniSONE  (DELTASONE ) 10 MG tablet; Take 6 tablets on day 1, then 5 tablets on day 2, then 4 tablets on day 3, then 3 tablets on day 4, then 2 tablets on day 5 and 1 tablet on day 6. Take all doses with food.  Dispense: 21 tablet;  Refill: 0 Assessment & Plan Acute upper respiratory infection with mild asthma exacerbation Suspect viral etiology based on clinical hx and exam. Low suspicion for COVID-19, did discuss POC testing but elected to defer.  Discussed supportive measures and OTC symptomatic treatment.  Advised no current need for antibiotics or steroids. Agreed to prescribe 6-day Prednisone  taper to hold for now, may use if asthma symptoms worsen significantly over upcoming long holiday weekend.  - Continue montelukast  and antihistamine daily. - Use Flonase daily. - Start Sudafed for congestion. - Okay to continue Celebrex as needed for pain. - Patient states she avoids Mucinex type medications, experienced side effects taking this in the past. - Use albuterol inhaler if experiencing chest tightness, wheezing, shortness of breath or increased coughing. - Prescribed a 6-day taper of prednisone  to be used if symptoms worsen. - Rest and monitor symptoms. - Consider e-visit, urgent care or schedule follow-up visit at our clinic next week if symptoms worsen or new symptoms develop (i.e. fever, severe or persistent sinus or ear pain/pressure, shortness of breath).   Patient Instructions  -Rest  and stay well hydrated (by drinking water  and other liquids).  -Continue Flonase/Fluticasone  nasal spray, 2 sprays to each nostril once a day. -Continue usual antihistamine (Loratadine) and Montekulast daily. -Continue Celebrex or Tylenol  (Acetaminophen /Paracetamol) as needed for pain. -Add decongestant (i.e. Sudafed/Pseudoephedrine) as needed for head/ear/sinus pressure/congestion. -Use albuterol inhaler  as often as every 4 hours as needed for shortness of breath, wheezing, chest tightness or persistent cough. -Try inhaling steam in shower to help loose congestion in head/sinuses. -For your sore throat/cough, use cough drops/throat lozenges, gargle warm salt water  and/or drink warm liquids (like tea with honey).  -If your asthma symptoms (chest tightness, wheezing, shortness of breath) worsen and are not responding well to your albuterol inhaler, you may fill the steroid (prednisone ) and begin taking it. -Schedule return visit, visit urgent care or do virtual visit as needed for new/worsening symptoms (i.e. fever 100.4 or above, severe or persistent sinus or ear pain/pressure, shortness of breath), or if symptoms do not improve as discussed with recommended treatment over the next 5-7 days.   General Counseling: Yazlin verbalizes understanding of the findings of todays visit and plan of treatment. she has been encouraged to call the office with any questions or concerns that should arise related to todays visit.   Joen Arts PA-C Physician Assistant

## 2024-04-12 NOTE — Patient Instructions (Addendum)
-  Rest and stay well hydrated (by drinking water  and other liquids).  -Continue Flonase/Fluticasone  nasal spray, 2 sprays to each nostril once a day. -Continue usual antihistamine (Loratadine) and Montekulast daily. -Continue Celebrex or Tylenol  (Acetaminophen /Paracetamol) as needed for pain. -Add decongestant (i.e. Sudafed/Pseudoephedrine) as needed for head/ear/sinus pressure/congestion. -Use albuterol inhaler  as often as every 4 hours as needed for shortness of breath, wheezing, chest tightness or persistent cough. -Try inhaling steam in shower to help loose congestion in head/sinuses. -For your sore throat/cough, use cough drops/throat lozenges, gargle warm salt water  and/or drink warm liquids (like tea with honey).  -If your asthma symptoms (chest tightness, wheezing, shortness of breath) worsen and are not responding well to your albuterol inhaler, you may fill the steroid (prednisone ) and begin taking it. -Schedule return visit, visit urgent care or do virtual visit as needed for new/worsening symptoms (i.e. fever 100.4 or above, severe or persistent sinus or ear pain/pressure, shortness of breath), or if symptoms do not improve as discussed with recommended treatment over the next 5-7 days.

## 2024-04-22 ENCOUNTER — Ambulatory Visit: Payer: BC Managed Care – PPO | Admitting: Dermatology

## 2024-04-26 ENCOUNTER — Ambulatory Visit

## 2024-04-26 DIAGNOSIS — L821 Other seborrheic keratosis: Secondary | ICD-10-CM

## 2024-04-26 DIAGNOSIS — L814 Other melanin hyperpigmentation: Secondary | ICD-10-CM

## 2024-04-26 DIAGNOSIS — D229 Melanocytic nevi, unspecified: Secondary | ICD-10-CM

## 2024-04-26 DIAGNOSIS — Z86018 Personal history of other benign neoplasm: Secondary | ICD-10-CM

## 2024-04-26 DIAGNOSIS — L578 Other skin changes due to chronic exposure to nonionizing radiation: Secondary | ICD-10-CM

## 2024-04-26 DIAGNOSIS — W908XXA Exposure to other nonionizing radiation, initial encounter: Secondary | ICD-10-CM | POA: Diagnosis not present

## 2024-04-26 DIAGNOSIS — Z808 Family history of malignant neoplasm of other organs or systems: Secondary | ICD-10-CM

## 2024-04-26 DIAGNOSIS — Z1283 Encounter for screening for malignant neoplasm of skin: Secondary | ICD-10-CM | POA: Diagnosis not present

## 2024-04-26 DIAGNOSIS — D1801 Hemangioma of skin and subcutaneous tissue: Secondary | ICD-10-CM

## 2024-04-26 NOTE — Progress Notes (Signed)
    Subjective   Madison Sullivan is a 43 y.o. female who presents for the following: Total body skin exam for skin cancer screening and mole check. The patient has spots, moles and lesions to be evaluated, some may be new or changing and the patient may have concern these could be cancer. Patient is established patient .  Today patient reports: Patient with hx of dysplastic nevus. Patient with family hx of skin cancer both parents (melanoma).  Review of Systems:    No other skin or systemic complaints except as noted in HPI or Assessment and Plan.  The following portions of the chart were reviewed this encounter and updated as appropriate: medications, allergies, medical history  Relevant Medical History:  Family history of skin cancer - melanoma (parents)  and Personal history of dysplastic nevus   Objective  (SKPE) Well appearing patient in no apparent distress; mood and affect are within normal limits. Examination was performed of the: Full Skin Examination: scalp, head, eyes, ears, nose, lips, neck, chest, axillae, abdomen, back, buttocks, bilateral upper extremities, bilateral lower extremities, hands, feet, fingers, toes, fingernails, and toenails.   Examination notable for: SKIN EXAM, Angioma(s): Scattered red vascular papule(s)  , Lentigo/lentigines: Scattered pigmented macules that are tan to brown in color and are somewhat non-uniform in shape and concentrated in the sun-exposed areas, Nevus/nevi: Scattered well-demarcated, regular, pigmented macule(s) and/or papule(s)  , Seborrheic Keratosis(es): Stuck-on appearing keratotic papule(s) on the trunk, none  irritated with redness, crusting, edema, and/or partial avulsion, Actinic Damage/Elastosis: chronic sun damage: dyspigmentation, telangiectasia, and wrinkling  Examination limited by: Undergarments and Shoes or socks      Assessment & Plan  (SKAP)   SKIN CANCER SCREENING PERFORMED TODAY.  BENIGN SKIN FINDINGS  - Lentigines  -  Seborrheic keratoses  - Hemangiomas   - Nevus/Multiple Benign Nevi/ Dermal Nevi  - Reassurance provided regarding the benign appearance of lesions noted on exam today; no treatment is indicated in the absence of symptoms/changes. - Reinforced importance of photoprotective strategies including liberal and frequent sunscreen use of a broad-spectrum SPF 30 or greater, use of protective clothing, and sun avoidance for prevention of cutaneous malignancy and photoaging.  Counseled patient on the importance of regular self-skin monitoring as well as routine clinical skin examinations as scheduled.   ACTINIC DAMAGE - Chronic condition, secondary to cumulative UV/sun exposure - Recommend daily broad spectrum sunscreen SPF 30+ to sun-exposed areas, reapply every 2 hours as needed.  - Staying in the shade or wearing long sleeves, sun glasses (UVA+UVB protection) and wide brim hats (4-inch brim around the entire circumference of the hat) are also recommended for sun protection.  - Call for new or changing lesions.  Personal history of family history of melanoma personal history of dysplastic nevi - Reviewed medical history for full details  - Reviewed sun protective measures as above - Encouraged full body skin exams     Level of service outlined above   Patient instructions (SKPI)   Procedures, orders, diagnosis for this visit:    There are no diagnoses linked to this encounter.  Return to clinic: Return in about 1 year (around 04/26/2025) for TBSE, HxDysplastic Nevi, w/ Dr. Raymund.  I, Jacquelynn V. Wilfred, CMA, am acting as scribe for Lauraine JAYSON Raymund, MD.  Documentation: I have reviewed the above documentation for accuracy and completeness, and I agree with the above.  Lauraine JAYSON Raymund, MD

## 2024-04-26 NOTE — Patient Instructions (Addendum)
 Sunscreen  Who needs sunscreen? Everyone. Sunscreen use can help prevent skin cancer by protecting you from the sun's harmful ultraviolet rays. Anyone can get skin cancer, regardless of age, gender or race. In fact, it is estimated that one in five Americans will develop skin cancer in their lifetime.  Sunscreen alone cannot fully protect you. In addition to wearing sunscreen, dermatologists recommend taking the following steps to protect your skin and find skin cancer early:  Seek shade when appropriate, remembering that the sun's rays are strongest between 10 a.m. and 2 p.m. If your shadow is shorter than you are, seek shade. Dress to protect yourself from the sun by wearing a lightweight long-sleeved shirt, pants, a wide-brimmed hat and sunglasses, when possible.  Use extra caution near water, snow and sand as they reflect the damaging rays of the sun, which can increase your chance of sunburn.  Get vitamin D safely through a healthy diet that may include vitamin supplements. Don't seek the sun. Avoid tanning beds. Ultraviolet light from the sun and tanning beds can cause skin cancer and wrinkling. If you want to look tan, you may wish to use a self-tanning product, but continue to use sunscreen with it.  When should I use sunscreen? Every day you go outside--even if you're just walking to and from your form of transportation. The sun emits harmful UV rays year-round. Even on cloudy days, up to 80 percent of the sun's harmful UV rays can penetrate your skin. Snow, sand and water increase the need for sunscreen because they reflect the sun's rays.  How much sunscreen should I use, and how often should I apply it? Most people only apply 25-50 percent of the recommended amount of sunscreen. Apply enough sunscreen to cover all exposed skin. Most adults need about 1 ounce -- or enough to fill a shot glass -- to fully cover their body.  Don't forget to apply to the tops of your feet, your neck, your ears  and the top of your head. Apply sunscreen to dry skin 15 minutes before going outdoors.  Skin cancer also can form on the lips. To protect your lips, apply a lip balm or lipstick that contains sunscreen with an SPF of 30 or higher.  When outdoors, reapply sunscreen approximately every two hours, or after swimming or sweating, according to the directions on the bottle.   Broad-spectrum sunscreens protect against both UVA and UVB rays. What is the difference between the rays? Sunlight consists of two types of harmful rays that reach the earth -- UVA rays and UVB rays. Overexposure to either can lead to skin cancer. In addition to causing skin cancer, here's what each of these rays do:  UVA rays (or aging rays) can prematurely age your skin, causing wrinkles and age spots, and can pass through window glass. UVB rays (or burning rays) are the primary cause of sunburn and are blocked by window glass  There is no safe way to tan. Every time you tan, you damage your skin. As this damage builds, you speed up the aging of your skin and increase your risk for all types of skin cancer.  What is the difference between chemical and physical sunscreens? Chemical sunscreens work like a sponge, absorbing the sun's rays. They contain one or more of the following active ingredients: oxybenzone, avobenzone, octisalate, octocrylene, homosalate and octinoxate. These formulations tend to be easier to rub into the skin without leaving a white residue.   Physical sunscreens work like a shield,  sitting sit on the surface of your skin and deflecting the sun's rays. They contain the active ingredients zinc oxide and/or titanium dioxide. Use this sunscreen if you have sensitive skin.   What type of sunscreen should I use? The best type of sunscreen is the one you will use again and again. Just make sure it offers broad-spectrum (UVA and UVB) protection, has an SPF of 30+, and is water-resistant. The kind of sunscreen you use is  a matter of personal choice, and may vary depending on the area of the body to be protected. Available sunscreen options include lotions, creams, gels, ointments, wax sticks and sprays.  Recommended physical sunscreens for face: - Neutrogena Sheer Zinc - Aveeno Positively Mineral Sensitive - CeraVe Hydrating Mineral (also has a tinted version) - La Roche-Posay Anthelios Mineral Face (comes as a cream, lotion, light fluid, and there is also a tinted version).  - EltaMD UV Clear (also has a tinted version)  Recommended physical sunscreens for body: - Neutrogena Sheer Zinc Dry-Touch Sunscreen Sensitive Skin Lotion Broad Spectrum SPF 50 - Aveeno Positively Mineral Sensitive Skin Sunscreen Broad Spectrum SPF 50 - La Roche-Posay Anthelios SPF 50 Mineral Sunscreen - Gentle Lotion - CeraVe Hydrating Mineral Sunscreen SPF 50  Recommended chemical sunscreens for face: - Anthelios UV Correct Face Sunscreen SPF 70 with Niacinamide - Neutrogena Clear Face Oil-Free SPF 50 with Helioplex - Neutrogena Sport Face Oil-Free SPF 70+ with Helioplex - Aveeno Protect + Hydrate Sunscreen For Face SPF 70 - La Roche-Posay Anthelios Light Fluid Sunscreen for Face SPF 60  Recommended chemical sunscreens for body: - Neutrogena Ultra Sheer Dry-Touch Sunscreen SPF 70 - Aveeno Protect + Hydrate Broad Spectrum All-Day Hydration SPF 60 (comes in a big pump) - La Roche-Posay Anthelios Melt-In Milk Sunscreen SPF 60   Melanoma ABCDEs  Melanoma is the most dangerous type of skin cancer, and is the leading cause of death from skin disease.  You are more likely to develop melanoma if you: Have light-colored skin, light-colored eyes, or red or blond hair Spend a lot of time in the sun Tan regularly, either outdoors or in a tanning bed Have had blistering sunburns, especially during childhood Have a close family member who has had a melanoma Have atypical moles or large birthmarks  Early detection of melanoma is key  since treatment is typically straightforward and cure rates are extremely high if we catch it early.   The first sign of melanoma is often a change in a mole or a new dark spot.  The ABCDE system is a way of remembering the signs of melanoma.  A for asymmetry:  The two halves do not match. B for border:  The edges of the growth are irregular. C for color:  A mixture of colors are present instead of an even brown color. D for diameter:  Melanomas are usually (but not always) greater than 6mm - the size of a pencil eraser. E for evolution:  The spot keeps changing in size, shape, and color.  Please check your skin once per month between visits. You can use a small mirror in front and a large mirror behind you to keep an eye on the back side or your body.   If you see any new or changing lesions before your next follow-up, please call to schedule a visit.  Please continue daily skin protection including broad spectrum sunscreen SPF 30+ to sun-exposed areas, reapplying every 2 hours as needed when you're outdoors.   Staying in  the shade or wearing long sleeves, sun glasses (UVA+UVB protection) and wide brim hats (4-inch brim around the entire circumference of the hat) are also recommended for sun protection.    Due to recent changes in healthcare laws, you may see results of your pathology and/or laboratory studies on MyChart before the doctors have had a chance to review them. We understand that in some cases there may be results that are confusing or concerning to you. Please understand that not all results are received at the same time and often the doctors may need to interpret multiple results in order to provide you with the best plan of care or course of treatment. Therefore, we ask that you please give us  2 business days to thoroughly review all your results before contacting the office for clarification. Should we see a critical lab result, you will be contacted sooner.   If You Need  Anything After Your Visit  If you have any questions or concerns for your doctor, please call our main line at [phone] and press option 4 to reach your doctor's medical assistant. If no one answers, please leave a voicemail as directed and we will return your call as soon as possible. Messages left after 4 pm will be answered the following business day.   You may also send us  a message via MyChart. We typically respond to MyChart messages within 1-2 business days.  For prescription refills, please ask your pharmacy to contact our office. Our fax number is [phone].  If you have an urgent issue when the clinic is closed that cannot wait until the next business day, you can page your doctor at the number below.    Please note that while we do our best to be available for urgent issues outside of office hours, we are not available 24/7.   If you have an urgent issue and are unable to reach us , you may choose to seek medical care at your doctor's office, retail clinic, urgent care center, or emergency room.  If you have a medical emergency, please immediately call 911 or go to the emergency department.  Pager Numbers  - Dr. Hester: [phone]  - Dr. Jackquline: [phone]  - Dr. Claudene: [phone]   In the event of inclement weather, please call our main line at [phone] for an update on the status of any delays or closures.  Dermatology Medication Tips: Please keep the boxes that topical medications come in in order to help keep track of the instructions about where and how to use these. Pharmacies typically print the medication instructions only on the boxes and not directly on the medication tubes.   If your medication is too expensive, please contact our office at [phone] option 4 or send us  a message through MyChart.   We are unable to tell what your co-pay for medications will be in advance as this is different depending on your insurance coverage. However, we  may be able to find a substitute medication at lower cost or fill out paperwork to get insurance to cover a needed medication.   If a prior authorization is required to get your medication covered by your insurance company, please allow us  1-2 business days to complete this process.  Drug prices often vary depending on where the prescription is filled and some pharmacies may offer cheaper prices.  The website www.goodrx.com contains coupons for medications through different pharmacies. The prices here do not account for what the cost may be with help from insurance (  it may be cheaper with your insurance), but the website can give you the price if you did not use any insurance.  - You can print the associated coupon and take it with your prescription to the pharmacy.  - You may also stop by our office during regular business hours and pick up a GoodRx coupon card.  - If you need your prescription sent electronically to a different pharmacy, notify our office through [facility], [place] or by phone at [phone] option 4.     Si Usted Necesita Algo Despus de Su Visita  Tambin puede enviarnos un mensaje a travs de Clinical cytogeneticist. Por lo general respondemos a los mensajes de MyChart en el transcurso de 1 a 2 das hbiles.  Para renovar recetas, por favor pida a su farmacia que se ponga en contacto con nuestra oficina. Randi lakes de fax es [place] [phone].  Si tiene un asunto urgente cuando la clnica est cerrada y que no puede esperar hasta el siguiente da hbil, puede llamar/localizar a su doctor(a) al nmero que aparece a continuacin.   Por favor, tenga en cuenta que aunque hacemos todo lo posible para estar disponibles para asuntos urgentes fuera del horario de [place], no estamos disponibles las 24 horas del da, los 7 [address]  [address] de la [place].   Si tiene un problema urgente y no puede comunicarse con nosotros, puede optar por buscar atencin mdica  en el consultorio de su doctor(a), en una  clnica privada, en un centro de atencin urgente o en una sala de emergencias.  Si tiene Engineer, drilling, por favor llame inmediatamente al 911 o vaya a la sala de emergencias.  Nmeros de bper  - Dr. Hester: [phone]  - Dra. Jackquline: [phone]  - Dr. Claudene: [phone]   En caso de inclemencias del tiempo, por favor llame a landry capes principal al [phone] para una actualizacin sobre el [place] de cualquier retraso o cierre.  Consejos para la medicacin en dermatologa: Por favor, guarde las cajas en las que vienen los medicamentos de uso tpico para ayudarle a seguir las instrucciones sobre dnde y cmo usarlos. Las farmacias generalmente imprimen las instrucciones del medicamento slo en las cajas y no directamente en los tubos del [place].   Si su medicamento es muy caro, por favor, pngase en contacto con landry rieger llamando al [phone] y presione la opcin 4 o envenos un mensaje a travs de Clinical cytogeneticist.   No podemos decirle cul ser su copago por los medicamentos por adelantado ya que esto es diferente dependiendo de la cobertura de su seguro. Sin embargo, es posible que podamos encontrar un medicamento sustituto a Audiological scientist un formulario para que el seguro cubra el medicamento que se considera necesario.   Si se requiere una autorizacin previa para que su compaa de seguros malta su medicamento, por favor permtanos de 1 a 2 das hbiles para completar este proceso.  Los precios de los medicamentos varan con frecuencia dependiendo del Environmental consultant de dnde se surte la receta y alguna farmacias pueden ofrecer precios ms baratos.  El sitio web www.goodrx.com tiene cupones para medicamentos de Health and safety inspector. Los precios aqu no tienen en cuenta lo que podra costar con la ayuda del seguro (puede ser ms barato con su seguro), pero el sitio web puede darle el precio si no utiliz Tourist information centre manager.  - Puede imprimir el cupn correspondiente  y llevarlo con su receta a la farmacia.  - Tambin puede pasar por nuestra oficina durante el horario de  atencin regular y Education officer, museum una tarjeta de cupones de GoodRx.  - Si necesita que su receta se enve electrnicamente a una farmacia diferente, informe a nuestra oficina a travs de MyChart de [place] o por telfono llamando al [phone] y presione la opcin 4.

## 2024-07-16 ENCOUNTER — Other Ambulatory Visit

## 2024-07-19 ENCOUNTER — Other Ambulatory Visit

## 2024-08-02 ENCOUNTER — Ambulatory Visit: Admitting: Oncology

## 2025-04-21 ENCOUNTER — Ambulatory Visit
